# Patient Record
Sex: Male | Born: 1950
Health system: Southern US, Community
[De-identification: ages and names within clinical notes are randomized; demographics above are authoritative.]

## PROBLEM LIST (undated history)

## (undated) DIAGNOSIS — T8859XA Other complications of anesthesia, initial encounter: Secondary | ICD-10-CM

## (undated) DIAGNOSIS — N189 Chronic kidney disease, unspecified: Secondary | ICD-10-CM

## (undated) DIAGNOSIS — M1990 Unspecified osteoarthritis, unspecified site: Secondary | ICD-10-CM

## (undated) DIAGNOSIS — Z87442 Personal history of urinary calculi: Secondary | ICD-10-CM

## (undated) DIAGNOSIS — E785 Hyperlipidemia, unspecified: Secondary | ICD-10-CM

## (undated) DIAGNOSIS — I10 Essential (primary) hypertension: Secondary | ICD-10-CM

## (undated) DIAGNOSIS — T7840XA Allergy, unspecified, initial encounter: Secondary | ICD-10-CM

## (undated) DIAGNOSIS — F329 Major depressive disorder, single episode, unspecified: Secondary | ICD-10-CM

## (undated) DIAGNOSIS — F32A Depression, unspecified: Secondary | ICD-10-CM

## (undated) DIAGNOSIS — K219 Gastro-esophageal reflux disease without esophagitis: Secondary | ICD-10-CM

## (undated) HISTORY — DX: Unspecified osteoarthritis, unspecified site: M19.90

## (undated) HISTORY — DX: Hyperlipidemia, unspecified: E78.5

## (undated) HISTORY — DX: Allergy, unspecified, initial encounter: T78.40XA

## (undated) HISTORY — PX: EYE SURGERY: SHX253

## (undated) HISTORY — DX: Essential (primary) hypertension: I10

## (undated) HISTORY — DX: Chronic kidney disease, unspecified: N18.9

## (undated) HISTORY — DX: Personal history of urinary calculi: Z87.442

## (undated) HISTORY — DX: Major depressive disorder, single episode, unspecified: F32.9

## (undated) HISTORY — DX: Depression, unspecified: F32.A

## (undated) HISTORY — DX: Gastro-esophageal reflux disease without esophagitis: K21.9

## (undated) HISTORY — PX: NOSE SURGERY: SHX723

---

## 2005-04-06 ENCOUNTER — Ambulatory Visit: Payer: Self-pay | Admitting: Otolaryngology

## 2006-04-10 IMAGING — CR DG CHEST 2V
1 series · 2 of 2 positions shown · non-contrast
Comparison: none

REASON FOR EXAM: Dyspnea
COMMENTS:

PROCEDURE:     DXR - DXR CHEST PA (OR AP) AND LATERAL  - [DATE] [DATE]
RESULT:     The lung fields are clear. The heart, mediastinal and osseous
structures show no significant abnormalities.

[Series 1: view not recorded · 0.17mm/px · 2 of 2 slices shown]
[im 1/2]
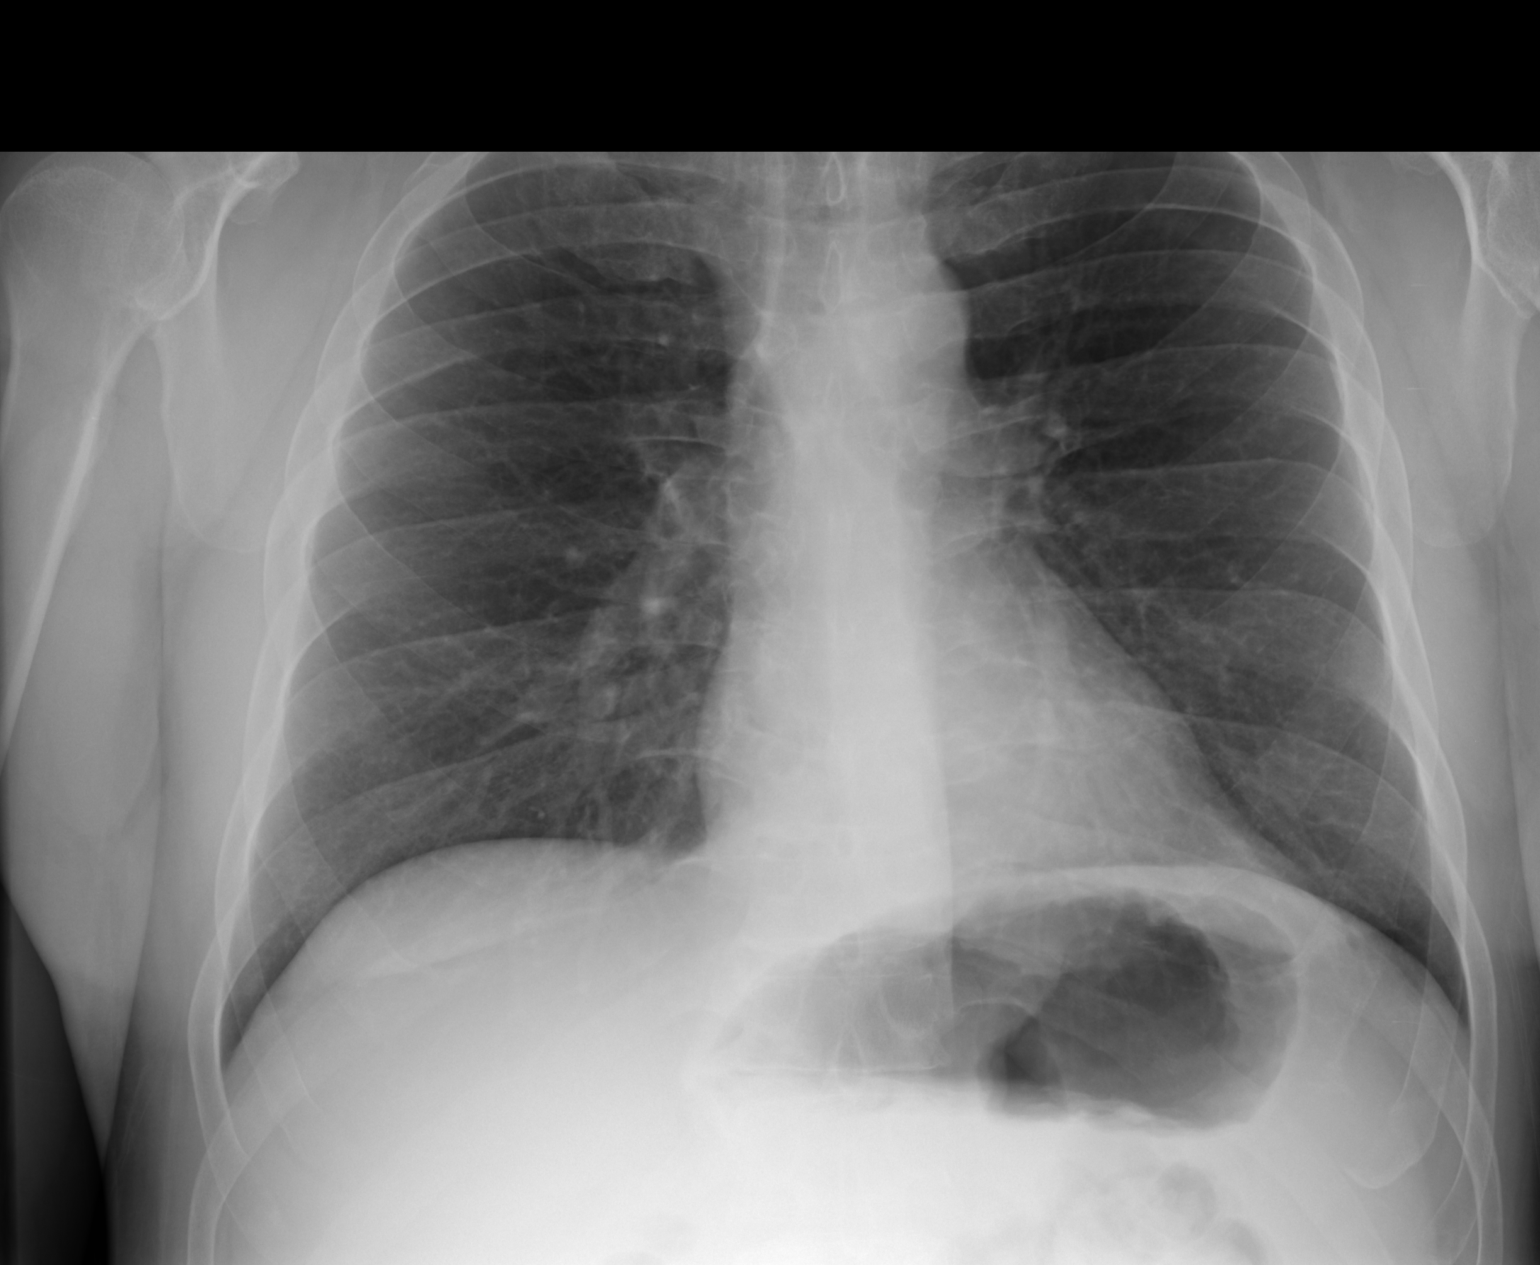
[im 2/2]
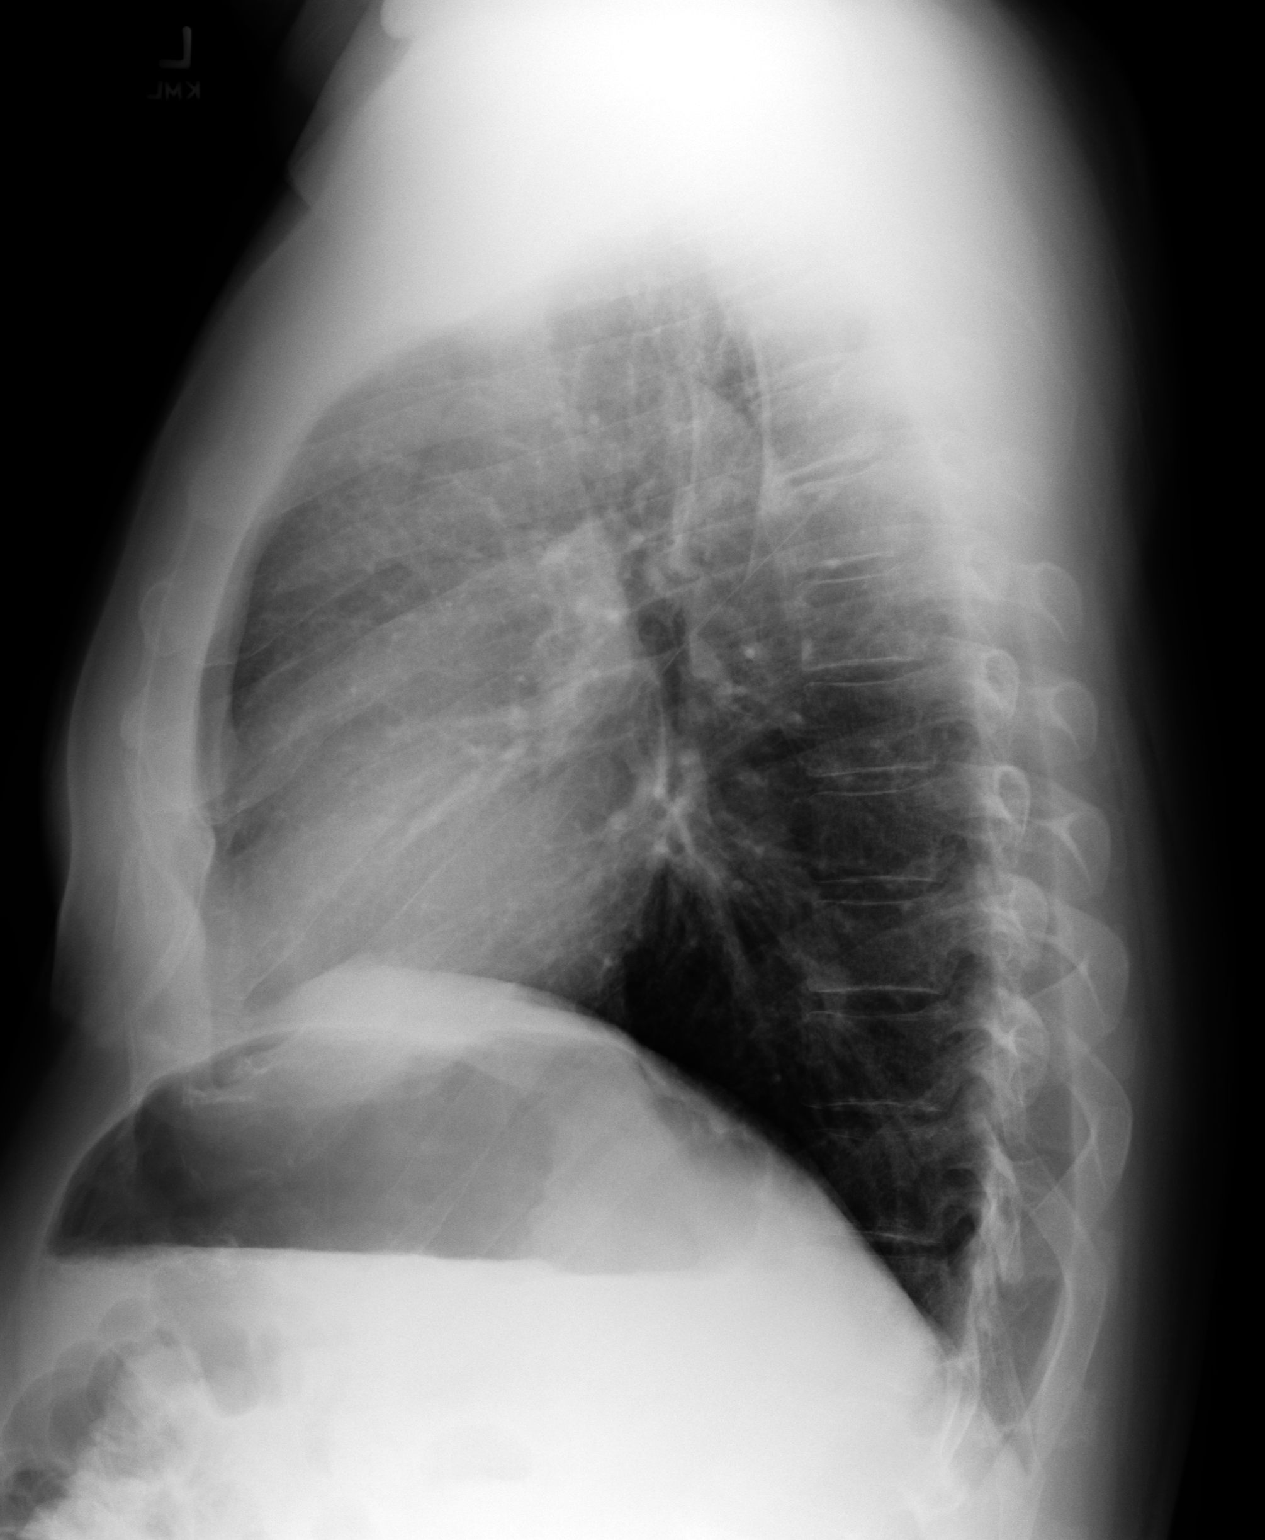

[2 of 2 positions shown; findings below may reference images not displayed]

IMPRESSION: No significant abnormalities are noted.

## 2006-12-09 ENCOUNTER — Emergency Department: Payer: Self-pay | Admitting: Emergency Medicine

## 2006-12-09 ENCOUNTER — Other Ambulatory Visit: Payer: Self-pay

## 2008-05-18 ENCOUNTER — Ambulatory Visit: Payer: Self-pay | Admitting: Gastroenterology

## 2012-05-21 HISTORY — PX: HIP SURGERY: SHX245

## 2013-03-21 HISTORY — PX: HIP SURGERY: SHX245

## 2013-03-21 HISTORY — PX: KNEE SURGERY: SHX244

## 2014-10-28 ENCOUNTER — Other Ambulatory Visit: Payer: Self-pay | Admitting: Family Medicine

## 2014-10-29 NOTE — Telephone Encounter (Signed)
Patient needs PE in Aug with MAC per PP notes

## 2015-03-14 ENCOUNTER — Other Ambulatory Visit: Payer: Self-pay | Admitting: Family Medicine

## 2015-08-03 ENCOUNTER — Ambulatory Visit (INDEPENDENT_AMBULATORY_CARE_PROVIDER_SITE_OTHER): Payer: 59 | Admitting: Family Medicine

## 2015-08-03 ENCOUNTER — Encounter: Payer: Self-pay | Admitting: Family Medicine

## 2015-08-03 VITALS — BP 124/84 | HR 66 | Temp 98.5°F | Ht 68.5 in | Wt 235.0 lb

## 2015-08-03 DIAGNOSIS — Z0000 Encounter for general adult medical examination without abnormal findings: Secondary | ICD-10-CM | POA: Diagnosis not present

## 2015-08-03 DIAGNOSIS — I10 Essential (primary) hypertension: Secondary | ICD-10-CM | POA: Diagnosis not present

## 2015-08-03 LAB — URINALYSIS, ROUTINE W REFLEX MICROSCOPIC
BILIRUBIN UA: NEGATIVE
Glucose, UA: NEGATIVE
Ketones, UA: NEGATIVE
Leukocytes, UA: NEGATIVE
NITRITE UA: NEGATIVE
PH UA: 5.5 (ref 5.0–7.5)
Protein, UA: NEGATIVE
RBC UA: NEGATIVE
SPEC GRAV UA: 1.01 (ref 1.005–1.030)
UUROB: 0.2 mg/dL (ref 0.2–1.0)

## 2015-08-03 MED ORDER — LOSARTAN POTASSIUM-HCTZ 100-12.5 MG PO TABS: 1.0000 | ORAL_TABLET | Freq: Every day | ORAL | Status: DC

## 2015-08-03 NOTE — Progress Notes (Signed)
BP 124/84 mmHg  Pulse 66  Temp(Src) 98.5 F (36.9 C)  Ht 5' 8.5" (1.74 m)  Wt 235 lb (106.595 kg)  BMI 35.21 kg/m2  SpO2 99%   Subjective:    Patient ID: Paul Rodriguez, male    DOB: 09-Mar-1951, 65 y.o.   MRN: [id number]  HPI: Paul Rodriguez is a 65 y.o. male  Chief Complaint  Patient presents with  . Annual Exam    pt states he would be interested in the shingles vaccine   She all in all doing well for scalp cysts taking medication from dermatologist is about through blood pressures been doing well no complaints Relevant past medical, surgical, family and social history reviewed and updated as indicated. Interim medical history since our last visit reviewed. Allergies and medications reviewed and updated.  Review of Systems  Constitutional: Negative.   HENT: Negative.   Eyes: Negative.   Respiratory: Negative.   Cardiovascular: Negative.   Gastrointestinal: Negative.   Endocrine: Negative.   Genitourinary: Negative.   Musculoskeletal: Negative.   Skin: Negative.   Allergic/Immunologic: Negative.   Neurological: Negative.   Hematological: Negative.   Psychiatric/Behavioral: Negative.     Per HPI unless specifically indicated above     Objective:    BP 124/84 mmHg  Pulse 66  Temp(Src) 98.5 F (36.9 C)  Ht 5' 8.5" (1.74 m)  Wt 235 lb (106.595 kg)  BMI 35.21 kg/m2  SpO2 99%  Wt Readings from Last 3 Encounters:  08/03/15 235 lb (106.595 kg)  06/17/14 247 lb (112.038 kg)    Physical Exam  Constitutional: He is oriented to person, place, and time. He appears well-developed and well-nourished.  HENT:  Head: Normocephalic and atraumatic.  Right Ear: External ear normal.  Left Ear: External ear normal.  Eyes: Conjunctivae and EOM are normal. Pupils are equal, round, and reactive to light.  Neck: Normal range of motion. Neck supple.  Cardiovascular: Normal rate, regular rhythm, normal heart sounds and intact distal pulses.   Pulmonary/Chest: Effort  normal and breath sounds normal.  Abdominal: Soft. Bowel sounds are normal. There is no splenomegaly or hepatomegaly.  Genitourinary: Rectum normal, prostate normal and penis normal.  Musculoskeletal: Normal range of motion.  Neurological: He is alert and oriented to person, place, and time. He has normal reflexes.  Skin: No rash noted. No erythema.  Psychiatric: He has a normal mood and affect. His behavior is normal. Judgment and thought content normal.    No results found for this or any previous visit.    Assessment & Plan:   Problem List Items Addressed This Visit      Cardiovascular and Mediastinum   Essential hypertension - Primary   Relevant Medications   EPINEPHrine (EPIPEN 2-PAK) 0.3 mg/0.3 mL IJ SOAJ injection   losartan-hydrochlorothiazide (HYZAAR) 100-12.5 MG tablet   Other Relevant Orders   Comprehensive metabolic panel   Lipid panel   CBC with Differential/Platelet   TSH   PSA   Urinalysis, Routine w reflex microscopic (not at [facility])   Hepatitis C Antibody    Other Visit Diagnoses    PE (physical exam), annual        Relevant Orders    Comprehensive metabolic panel    Lipid panel    CBC with Differential/Platelet    TSH    PSA    Urinalysis, Routine w reflex microscopic (not at [facility])    Hepatitis C Antibody        Follow up plan: Return in  about 6 months (around 02/03/2016) for bmp.

## 2015-08-04 ENCOUNTER — Encounter: Payer: Self-pay | Admitting: Family Medicine

## 2015-08-04 LAB — COMPREHENSIVE METABOLIC PANEL
ALK PHOS: 86 IU/L (ref 39–117)
ALT: 24 IU/L (ref 0–44)
AST: 19 IU/L (ref 0–40)
Albumin/Globulin Ratio: 1.9 (ref 1.2–2.2)
Albumin: 4.5 g/dL (ref 3.6–4.8)
BUN/Creatinine Ratio: 12 (ref 10–22)
BUN: 13 mg/dL (ref 8–27)
Bilirubin Total: 0.7 mg/dL (ref 0.0–1.2)
CHLORIDE: 99 mmol/L (ref 96–106)
CO2: 22 mmol/L (ref 18–29)
Calcium: 9.3 mg/dL (ref 8.6–10.2)
Creatinine, Ser: 1.06 mg/dL (ref 0.76–1.27)
GFR calc Af Amer: 86 mL/min/{1.73_m2} (ref >59)
GFR calc non Af Amer: 74 mL/min/{1.73_m2} (ref >59)
GLUCOSE: 94 mg/dL (ref 65–99)
Globulin, Total: 2.4 g/dL (ref 1.5–4.5)
Potassium: 4.1 mmol/L (ref 3.5–5.2)
Sodium: 137 mmol/L (ref 134–144)
Total Protein: 6.9 g/dL (ref 6.0–8.5)

## 2015-08-04 LAB — LIPID PANEL
Chol/HDL Ratio: 3.9 ratio units (ref 0.0–5.0)
Cholesterol, Total: 150 mg/dL (ref 100–199)
HDL: 38 mg/dL — AB (ref >39)
LDL Calculated: 83 mg/dL (ref 0–99)
TRIGLYCERIDES: 144 mg/dL (ref 0–149)
VLDL CHOLESTEROL CAL: 29 mg/dL (ref 5–40)

## 2015-08-04 LAB — CBC WITH DIFFERENTIAL/PLATELET
BASOS: 1 %
Basophils Absolute: 0 10*3/uL (ref 0.0–0.2)
EOS (ABSOLUTE): 0.1 10*3/uL (ref 0.0–0.4)
EOS: 1 %
HEMATOCRIT: 46.8 % (ref 37.5–51.0)
Hemoglobin: 16 g/dL (ref 12.6–17.7)
IMMATURE GRANULOCYTES: 0 %
Immature Grans (Abs): 0 10*3/uL (ref 0.0–0.1)
LYMPHS ABS: 2.1 10*3/uL (ref 0.7–3.1)
Lymphs: 36 %
MCH: 30.8 pg (ref 26.6–33.0)
MCHC: 34.2 g/dL (ref 31.5–35.7)
MCV: 90 fL (ref 79–97)
MONOS ABS: 0.4 10*3/uL (ref 0.1–0.9)
Monocytes: 7 %
NEUTROS ABS: 3.3 10*3/uL (ref 1.4–7.0)
NEUTROS PCT: 55 %
Platelets: 217 10*3/uL (ref 150–379)
RBC: 5.19 x10E6/uL (ref 4.14–5.80)
RDW: 14 % (ref 12.3–15.4)
WBC: 5.9 10*3/uL (ref 3.4–10.8)

## 2015-08-04 LAB — TSH: TSH: 2.49 u[IU]/mL (ref 0.450–4.500)

## 2015-08-04 LAB — HEPATITIS C ANTIBODY: Hep C Virus Ab: <0.1 s/co ratio (ref 0.0–0.9)

## 2015-08-04 LAB — PSA: Prostate Specific Ag, Serum: 0.8 ng/mL (ref 0.0–4.0)

## 2016-02-21 ENCOUNTER — Ambulatory Visit: Payer: 59 | Admitting: Family Medicine

## 2016-02-23 ENCOUNTER — Ambulatory Visit (INDEPENDENT_AMBULATORY_CARE_PROVIDER_SITE_OTHER): Payer: 59 | Admitting: Family Medicine

## 2016-02-23 ENCOUNTER — Encounter: Payer: Self-pay | Admitting: Family Medicine

## 2016-02-23 VITALS — BP 122/80 | HR 87 | Temp 98.0°F | Ht 69.0 in | Wt 237.6 lb

## 2016-02-23 DIAGNOSIS — Z2911 Encounter for prophylactic immunotherapy for respiratory syncytial virus (RSV): Secondary | ICD-10-CM

## 2016-02-23 DIAGNOSIS — Z23 Encounter for immunization: Secondary | ICD-10-CM | POA: Diagnosis not present

## 2016-02-23 DIAGNOSIS — I10 Essential (primary) hypertension: Secondary | ICD-10-CM | POA: Diagnosis not present

## 2016-02-23 DIAGNOSIS — E785 Hyperlipidemia, unspecified: Secondary | ICD-10-CM | POA: Insufficient documentation

## 2016-02-23 DIAGNOSIS — E7800 Pure hypercholesterolemia, unspecified: Secondary | ICD-10-CM | POA: Diagnosis not present

## 2016-02-23 MED ORDER — MOMETASONE FUROATE 0.1 % EX CREA: 1.0000 "application " | TOPICAL_CREAM | Freq: Every day | CUTANEOUS | 0 refills | Status: DC

## 2016-02-23 MED ORDER — ROSUVASTATIN CALCIUM 5 MG PO TABS: 5.0000 mg | ORAL_TABLET | Freq: Every day | ORAL | 2 refills | Status: DC

## 2016-02-23 NOTE — Assessment & Plan Note (Signed)
The current medical regimen is effective;  continue present plan and medications.  

## 2016-02-23 NOTE — Patient Instructions (Addendum)
Shingles Vaccine: What You Need to Know WHAT IS SHINGLES?  Shingles is a painful skin rash, often with blisters. It is also called Herpes Zoster or just Zoster.  A shingles rash usually appears on one side of the face or body and lasts from 2 to 4 weeks. Its main symptom is pain, which can be quite severe. Other symptoms of shingles can include fever, headache, chills, and upset stomach. Very rarely, a shingles infection can lead to pneumonia, hearing problems, blindness, brain inflammation (encephalitis), or death.  For about 1 person in 5, severe pain can continue even after the rash clears up. This is called post-herpetic neuralgia.  Shingles is caused by the Varicella Zoster virus. This is the same virus that causes chickenpox. Only someone who has had a case of chickenpox or rarely, has gotten chickenpox vaccine, can get shingles. The virus stays in your body. It can reappear many years later to cause a case of shingles.  You cannot catch shingles from another person with shingles. However, a person who has never had chickenpox (or chickenpox vaccine) could get chickenpox from someone with shingles. This is not very common.  Shingles is far more common in people 15 and older than in younger people. It is also more common in people whose immune systems are weakened because of a disease such as cancer or drugs such as steroids or chemotherapy.  At least 1 million people get shingles per year in the [place]. SHINGLES VACCINE  A vaccine for shingles was licensed in 8657. In clinical trials, the vaccine reduced the risk of shingles by 50%. It can also reduce the pain in people who still get shingles after being vaccinated.  A single dose of shingles vaccine is recommended for adults 56 years of age and older. SOME PEOPLE SHOULD NOT GET SHINGLES VACCINE OR SHOULD WAIT A person should not get shingles vaccine if he or she:  Has ever had a life-threatening allergic reaction to gelatin, the  antibiotic neomycin, or any other component of shingles vaccine. Tell your caregiver if you have any severe allergies.  Has a weakened immune system because of current:  AIDS or another disease that affects the immune system.  Treatment with drugs that affect the immune system, such as prolonged use of high-dose steroids.  Cancer treatment, such as radiation or chemotherapy.  Cancer affecting the bone marrow or lymphatic system, such as leukemia or lymphoma.  Is pregnant, or might be pregnant. Women should not become pregnant until at least 4 weeks after getting shingles vaccine. Someone with a minor illness, such as a cold, may be vaccinated. Anyone with a moderate or severe acute illness should usually wait until he or she recovers before getting the vaccine. This includes anyone with a temperature of 101.3 F (38 C) or higher. WHAT ARE THE RISKS FROM SHINGLES VACCINE?  A vaccine, like any medicine, could possibly cause serious problems, such as severe allergic reactions. However, the risk of a vaccine causing serious harm, or death, is extremely small.  No serious problems have been identified with shingles vaccine. Mild Problems  Redness, soreness, swelling, or itching at the site of the injection (about 1 person in 3).  Headache (about 1 person in 5). Like all vaccines, shingles vaccine is being closely monitored for unusual or severe problems. WHAT IF THERE IS A MODERATE OR SEVERE REACTION? What should I look for? Any unusual condition, such as a severe allergic reaction or a high fever. If a severe allergic reaction  occurred, it would be within a few minutes to an hour after the shot. Signs of a serious allergic reaction can include difficulty breathing, weakness, hoarseness or wheezing, a fast heartbeat, hives, dizziness, paleness, or swelling of the throat. What should I do?  Call your caregiver, or get the person to a caregiver right away.  Tell the caregiver what  happened, the date and time it happened, and when the vaccination was given.  Ask the caregiver to report the reaction by filing a Vaccine Adverse Event Reporting System (VAERS) form. Or, you can file this report through the VAERS web site at www.vaers.SamedayNews.es or by calling 217-179-2310. VAERS does not provide medical advice. HOW CAN I LEARN MORE?  Ask your caregiver. He or she can give you the vaccine package insert or suggest other sources of information.  Contact the Centers for Disease Control and Prevention (CDC):  Call 4078075968 (1-800-CDC-INFO).  Visit the CDC website at http://hunter.com/ CDC Shingles Vaccine VIS (02/24/08)   This information is not intended to replace advice given to you by your health care provider. Make sure you discuss any questions you have with your health care provider.   Document Released: 03/04/2006 Document Revised: 09/21/2014 Document Reviewed: 08/27/2012 Elsevier Interactive Patient Education 2016 Elsevier Inc. Pneumococcal Polysaccharide Vaccine: What You Need to Know 1. Why get vaccinated? Vaccination can protect older adults (and some children and younger adults) from pneumococcal disease. Pneumococcal disease is caused by bacteria that can spread from person to person through close contact. It can cause ear infections, and it can also lead to more serious infections of the:   Lungs (pneumonia),  Blood (bacteremia), and  Covering of the brain and spinal cord (meningitis). Meningitis can cause deafness and brain damage, and it can be fatal. Anyone can get pneumococcal disease, but children under 69 years of age, people with certain medical conditions, adults over 76 years of age, and cigarette smokers are at the highest risk. About 18,000 older adults die each year from pneumococcal disease in the [place]. Treatment of pneumococcal infections with penicillin and other drugs used to be more effective. But some strains of the disease  have become resistant to these drugs. This makes prevention of the disease, through vaccination, even more important. 2. Pneumococcal polysaccharide vaccine (PPSV23) Pneumococcal polysaccharide vaccine (PPSV23) protects against 23 types of pneumococcal bacteria. It will not prevent all pneumococcal disease. PPSV23 is recommended for:  All adults 70 years of age and older,  Anyone 2 through 64 years of age with certain long-term health problems,  Anyone 2 through 64 years of age with a weakened immune system,  Adults 33 through 64 years of age who smoke cigarettes or have asthma. Most people need only one dose of PPSV. A second dose is recommended for certain high-risk groups. People 85 and older should get a dose even if they have gotten one or more doses of the vaccine before they turned 65. Your healthcare provider can give you more information about these recommendations. Most healthy adults develop protection within 2 to 3 weeks of getting the shot. 3. Some people should not get this vaccine  Anyone who has had a life-threatening allergic reaction to PPSV should not get another dose.  Anyone who has a severe allergy to any component of PPSV should not receive it. Tell your provider if you have any severe allergies.  Anyone who is moderately or severely ill when the shot is scheduled may be asked to wait until they recover before getting  the vaccine. Someone with a mild illness can usually be vaccinated.  Children less than 13 years of age should not receive this vaccine.  There is no evidence that PPSV is harmful to either a pregnant woman or to her fetus. However, as a precaution, women who need the vaccine should be vaccinated before becoming pregnant, if possible. 4. Risks of a vaccine reaction With any medicine, including vaccines, there is a chance of side effects. These are usually mild and go away on their own, but serious reactions are also possible. About half of people who  get PPSV have mild side effects, such as redness or pain where the shot is given, which go away within about two days. Less than 1 out of 100 people develop a fever, muscle aches, or more severe local reactions. Problems that could happen after any vaccine:  People sometimes faint after a medical procedure, including vaccination. Sitting or lying down for about 15 minutes can help prevent fainting, and injuries caused by a fall. Tell your doctor if you feel dizzy, or have vision changes or ringing in the ears.  Some people get severe pain in the shoulder and have difficulty moving the arm where a shot was given. This happens very rarely.  Any medication can cause a severe allergic reaction. Such reactions from a vaccine are very rare, estimated at about 1 in a million doses, and would happen within a few minutes to a few hours after the vaccination. As with any medicine, there is a very remote chance of a vaccine causing a serious injury or death. The safety of vaccines is always being monitored. For more information, visit: http://www.aguilar.org/ 5. What if there is a serious reaction? What should I look for? Look for anything that concerns you, such as signs of a severe allergic reaction, very high fever, or unusual behavior.  Signs of a severe allergic reaction can include hives, swelling of the face and throat, difficulty breathing, a fast heartbeat, dizziness, and weakness. These would usually start a few minutes to a few hours after the vaccination. What should I do? If you think it is a severe allergic reaction or other emergency that can't wait, call 9-1-1 or get to the nearest hospital. Otherwise, call your doctor. Afterward, the reaction should be reported to the Vaccine Adverse Event Reporting System (VAERS). Your doctor might file this report, or you can do it yourself through the VAERS web site at www.vaers.SamedayNews.es, or by calling [phone].  VAERS does not give medical  advice. 6. How can I learn more?  Ask your doctor. He or she can give you the vaccine package insert or suggest other sources of information.  Call your local or state health department.  Contact the Centers for Disease Control and Prevention (CDC):  Call 4630188051 (1-800-CDC-INFO) or  Visit CDC's website at http://hunter.com/ CDC Pneumococcal Polysaccharide Vaccine VIS (09/11/13)   This information is not intended to replace advice given to you by your health care provider. Make sure you discuss any questions you have with your health care provider.   Document Released: 03/04/2006 Document Revised: 05/28/2014 Document Reviewed: 09/14/2013 Elsevier Interactive Patient Education [employer].

## 2016-02-23 NOTE — Assessment & Plan Note (Signed)
Discussed cholesterol risk factors and medication will change to Crestor 5 mg and will recheck in 1 month or so to recheck lipids and liver.

## 2016-02-23 NOTE — Progress Notes (Signed)
BP 122/80 (BP Location: Left Arm, Patient Position: Sitting, Cuff Size: Normal)   Pulse 87   Temp 98 F (36.7 C)   Ht 5\' 9"  (1.753 m)   Wt 237 lb 9.6 oz (107.8 kg)   SpO2 95%   BMI 35.09 kg/m    Subjective:    Patient ID: Paul Rodriguez, male    DOB: 05/02/1951, 65 y.o.   MRN: [id number]  HPI: Paul Rodriguez is a 65 y.o. male  Chief Complaint  Patient presents with  . Follow-up  . Hypertension   Patient recheck hypertension good blood pressure control no issues with medications. Reviewed notes from cardiology patient was placed on Pravachol 20 mg for cholesterol due to elevated risk factors on cardiac risk factor scoring. Patient developed marked myalgias especially in his legs and stop the medications. Myalgias have since resolved.  Relevant past medical, surgical, family and social history reviewed and updated as indicated. Interim medical history since our last visit reviewed. Allergies and medications reviewed and updated.  Review of Systems  Constitutional: Negative.   Respiratory: Negative.   Cardiovascular: Negative.     Per HPI unless specifically indicated above     Objective:    BP 122/80 (BP Location: Left Arm, Patient Position: Sitting, Cuff Size: Normal)   Pulse 87   Temp 98 F (36.7 C)   Ht 5\' 9"  (1.753 m)   Wt 237 lb 9.6 oz (107.8 kg)   SpO2 95%   BMI 35.09 kg/m   Wt Readings from Last 3 Encounters:  02/23/16 237 lb 9.6 oz (107.8 kg)  08/03/15 235 lb (106.6 kg)  06/17/14 247 lb (112 kg)    Physical Exam  Constitutional: He is oriented to person, place, and time. He appears well-developed and well-nourished. No distress.  HENT:  Head: Normocephalic and atraumatic.  Right Ear: Hearing normal.  Left Ear: Hearing normal.  Nose: Nose normal.  Eyes: Conjunctivae and lids are normal. Right eye exhibits no discharge. Left eye exhibits no discharge. No scleral icterus.  Cardiovascular: Normal rate, regular rhythm and normal heart sounds.     Pulmonary/Chest: Effort normal and breath sounds normal. No respiratory distress.  Musculoskeletal: Normal range of motion.  Neurological: He is alert and oriented to person, place, and time.  Skin: Skin is intact. No rash noted.  Psychiatric: He has a normal mood and affect. His speech is normal and behavior is normal. Judgment and thought content normal. Cognition and memory are normal.    Results for orders placed or performed in visit on 08/03/15  Comprehensive metabolic panel  Result Value Ref Range   Glucose 94 65 - 99 mg/dL   BUN 13 8 - 27 mg/dL   Creatinine, Ser 1.06 0.76 - 1.27 mg/dL   GFR calc non Af Amer 74 >59 mL/min/1.73   GFR calc Af Amer 86 >59 mL/min/1.73   BUN/Creatinine Ratio 12 10 - 22   Sodium 137 134 - 144 mmol/L   Potassium 4.1 3.5 - 5.2 mmol/L   Chloride 99 96 - 106 mmol/L   CO2 22 18 - 29 mmol/L   Calcium 9.3 8.6 - 10.2 mg/dL   Total Protein 6.9 6.0 - 8.5 g/dL   Albumin 4.5 3.6 - 4.8 g/dL   Globulin, Total 2.4 1.5 - 4.5 g/dL   Albumin/Globulin Ratio 1.9 1.2 - 2.2   Bilirubin Total 0.7 0.0 - 1.2 mg/dL   Alkaline Phosphatase 86 39 - 117 IU/L   AST 19 0 - 40 IU/L  ALT 24 0 - 44 IU/L  Lipid panel  Result Value Ref Range   Cholesterol, Total 150 100 - 199 mg/dL   Triglycerides 144 0 - 149 mg/dL   HDL 38 (L) >39 mg/dL   VLDL Cholesterol Cal 29 5 - 40 mg/dL   LDL Calculated 83 0 - 99 mg/dL   Chol/HDL Ratio 3.9 0.0 - 5.0 ratio units  CBC with Differential/Platelet  Result Value Ref Range   WBC 5.9 3.4 - 10.8 x10E3/uL   RBC 5.19 4.14 - 5.80 x10E6/uL   Hemoglobin 16.0 12.6 - 17.7 g/dL   Hematocrit 46.8 37.5 - 51.0 %   MCV 90 79 - 97 fL   MCH 30.8 26.6 - 33.0 pg   MCHC 34.2 31.5 - 35.7 g/dL   RDW 14.0 12.3 - 15.4 %   Platelets 217 150 - 379 x10E3/uL   Neutrophils 55 %   Lymphs 36 %   Monocytes 7 %   Eos 1 %   Basos 1 %   Neutrophils Absolute 3.3 1.4 - 7.0 x10E3/uL   Lymphocytes Absolute 2.1 0.7 - 3.1 x10E3/uL   Monocytes Absolute 0.4 0.1 - 0.9  x10E3/uL   EOS (ABSOLUTE) 0.1 0.0 - 0.4 x10E3/uL   Basophils Absolute 0.0 0.0 - 0.2 x10E3/uL   Immature Granulocytes 0 %   Immature Grans (Abs) 0.0 0.0 - 0.1 x10E3/uL  TSH  Result Value Ref Range   TSH 2.490 0.450 - 4.500 uIU/mL  PSA  Result Value Ref Range   Prostate Specific Ag, Serum 0.8 0.0 - 4.0 ng/mL  Urinalysis, Routine w reflex microscopic (not at [facility])  Result Value Ref Range   Specific Gravity, UA 1.010 1.005 - 1.030   pH, UA 5.5 5.0 - 7.5   Color, UA Yellow Yellow   Appearance Ur Clear Clear   Leukocytes, UA Negative Negative   Protein, UA Negative Negative/Trace   Glucose, UA Negative Negative   Ketones, UA Negative Negative   RBC, UA Negative Negative   Bilirubin, UA Negative Negative   Urobilinogen, Ur 0.2 0.2 - 1.0 mg/dL   Nitrite, UA Negative Negative  Hepatitis C Antibody  Result Value Ref Range   Hep C Virus Ab <0.1 0.0 - 0.9 s/co ratio      Assessment & Plan:   Problem List Items Addressed This Visit      Cardiovascular and Mediastinum   Essential hypertension - Primary    The current medical regimen is effective;  continue present plan and medications.       Relevant Medications   rosuvastatin (CRESTOR) 5 MG tablet   Other Relevant Orders   Basic metabolic panel     Other   Hyperlipidemia    Discussed cholesterol risk factors and medication will change to Crestor 5 mg and will recheck in 1 month or so to recheck lipids and liver.      Relevant Medications   rosuvastatin (CRESTOR) 5 MG tablet    Other Visit Diagnoses   None.      Follow up plan: Return in about 4 weeks (around 03/22/2016) for   Lipids, ALT, AST.

## 2016-02-23 NOTE — Addendum Note (Signed)
Addended by: Sandria Manly on: 02/23/2016 03:26 PM   Modules accepted: Orders

## 2016-02-24 LAB — BASIC METABOLIC PANEL
BUN/Creatinine Ratio: 12 (ref 10–24)
BUN: 15 mg/dL (ref 8–27)
CALCIUM: 9.5 mg/dL (ref 8.6–10.2)
CO2: 26 mmol/L (ref 18–29)
CREATININE: 1.23 mg/dL (ref 0.76–1.27)
Chloride: 101 mmol/L (ref 96–106)
GFR calc Af Amer: 71 mL/min/{1.73_m2} (ref >59)
GFR, EST NON AFRICAN AMERICAN: 62 mL/min/{1.73_m2} (ref >59)
GLUCOSE: 103 mg/dL — AB (ref 65–99)
Potassium: 4.3 mmol/L (ref 3.5–5.2)
Sodium: 141 mmol/L (ref 134–144)

## 2016-02-27 ENCOUNTER — Encounter: Payer: Self-pay | Admitting: Family Medicine

## 2016-03-29 ENCOUNTER — Ambulatory Visit: Payer: 59 | Admitting: Family Medicine

## 2016-04-04 ENCOUNTER — Encounter: Payer: Self-pay | Admitting: Family Medicine

## 2016-04-04 ENCOUNTER — Ambulatory Visit (INDEPENDENT_AMBULATORY_CARE_PROVIDER_SITE_OTHER): Payer: 59 | Admitting: Family Medicine

## 2016-04-04 VITALS — BP 125/84 | HR 86 | Temp 98.1°F | Wt 240.7 lb

## 2016-04-04 DIAGNOSIS — E7800 Pure hypercholesterolemia, unspecified: Secondary | ICD-10-CM | POA: Diagnosis not present

## 2016-04-04 DIAGNOSIS — J019 Acute sinusitis, unspecified: Secondary | ICD-10-CM

## 2016-04-04 LAB — LP+ALT+AST PICCOLO, WAIVED
ALT (SGPT) PICCOLO, WAIVED: 33 U/L (ref 10–47)
AST (SGOT) PICCOLO, WAIVED: 33 U/L (ref 11–38)
CHOL/HDL RATIO PICCOLO,WAIVE: 2.6 mg/dL
Cholesterol Piccolo, Waived: 120 mg/dL (ref <200)
HDL Chol Piccolo, Waived: 46 mg/dL — ABNORMAL LOW (ref >59)
LDL Chol Calc Piccolo Waived: 38 mg/dL (ref <100)
Triglycerides Piccolo,Waived: 183 mg/dL — ABNORMAL HIGH (ref <150)
VLDL CHOL CALC PICCOLO,WAIVE: 37 mg/dL — AB (ref <30)

## 2016-04-04 MED ORDER — AMOXICILLIN 875 MG PO TABS: 875.0000 mg | ORAL_TABLET | Freq: Two times a day (BID) | ORAL | 0 refills | Status: DC

## 2016-04-04 NOTE — Progress Notes (Signed)
BP 125/84 (BP Location: Left Arm, Patient Position: Sitting, Cuff Size: Large)   Pulse 86   Temp 98.1 F (36.7 C)   Wt 240 lb 11.2 oz (109.2 kg)   SpO2 97%   BMI 35.55 kg/m    Subjective:    Patient ID: Paul Rodriguez, male    DOB: 18-Dec-1950, 65 y.o.   MRN: [id number]  HPI: Paul Rodriguez is a 65 y.o. male  Chief Complaint  Patient presents with  . Hyperlipidemia   Patient doing well on Crestor 5 mg maybe some morning stiffness but not enough to attribute medications otherwise doing well.  Patient with 2 and half weeks plus of sinus drainage pressure congestion cough productive green sputum and really just not getting better and may be worse does have pressure sensation in his head with hoarseness. Has tried some over-the-counter medications with no relief  Relevant past medical, surgical, family and social history reviewed and updated as indicated. Interim medical history since our last visit reviewed. Allergies and medications reviewed and updated.  Review of Systems  Constitutional: Negative.   HENT: Positive for congestion, postnasal drip, sinus pain, sinus pressure and sore throat.   Respiratory: Positive for cough. Negative for shortness of breath.   Cardiovascular: Negative.     Per HPI unless specifically indicated above     Objective:    BP 125/84 (BP Location: Left Arm, Patient Position: Sitting, Cuff Size: Large)   Pulse 86   Temp 98.1 F (36.7 C)   Wt 240 lb 11.2 oz (109.2 kg)   SpO2 97%   BMI 35.55 kg/m   Wt Readings from Last 3 Encounters:  04/04/16 240 lb 11.2 oz (109.2 kg)  02/23/16 237 lb 9.6 oz (107.8 kg)  08/03/15 235 lb (106.6 kg)    Physical Exam  Constitutional: He is oriented to person, place, and time. He appears well-developed and well-nourished. No distress.  HENT:  Head: Normocephalic and atraumatic.  Right Ear: Hearing normal.  Left Ear: Hearing normal.  Nose: Nose normal.  Eyes: Conjunctivae and lids are normal. Right eye  exhibits no discharge. Left eye exhibits no discharge. No scleral icterus.  Cardiovascular: Normal rate, regular rhythm and normal heart sounds.   Pulmonary/Chest: Effort normal and breath sounds normal. No respiratory distress.  Musculoskeletal: Normal range of motion.  Neurological: He is alert and oriented to person, place, and time.  Skin: Skin is intact. No rash noted.  Psychiatric: He has a normal mood and affect. His speech is normal and behavior is normal. Judgment and thought content normal. Cognition and memory are normal.    Results for orders placed or performed in visit on [id number]  Basic metabolic panel  Result Value Ref Range   Glucose 103 (H) 65 - 99 mg/dL   BUN 15 8 - 27 mg/dL   Creatinine, Ser 1.23 0.76 - 1.27 mg/dL   GFR calc non Af Amer 62 >59 mL/min/1.73   GFR calc Af Amer 71 >59 mL/min/1.73   BUN/Creatinine Ratio 12 10 - 24   Sodium 141 134 - 144 mmol/L   Potassium 4.3 3.5 - 5.2 mmol/L   Chloride 101 96 - 106 mmol/L   CO2 26 18 - 29 mmol/L   Calcium 9.5 8.6 - 10.2 mg/dL      Assessment & Plan:   Problem List Items Addressed This Visit      Other   Hyperlipidemia - Primary   Relevant Orders   LP+ALT+AST [place], [place]    Other  Visit Diagnoses    Acute sinusitis, recurrence not specified, unspecified location       Patient discussed sinusitis care and treatment with Tylenol Mucinex nasal rinse Allegra-D which patient already uses may try Nasacort or Flonase.   Relevant Medications   amoxicillin (AMOXIL) 875 MG tablet       Follow up plan: Return for Physical Exam.

## 2016-04-24 ENCOUNTER — Other Ambulatory Visit
Admission: RE | Admit: 2016-04-24 | Discharge: 2016-04-24 | Disposition: A | Discharge status: Home / Self Care | Payer: 59 | Source: Other Acute Inpatient Hospital | Attending: Family Medicine | Admitting: Family Medicine

## 2016-04-24 ENCOUNTER — Other Ambulatory Visit: Payer: Self-pay | Admitting: Unknown Physician Specialty

## 2016-04-24 ENCOUNTER — Ambulatory Visit
Admission: RE | Admit: 2016-04-24 | Discharge: 2016-04-24 | Disposition: A | Discharge status: Home / Self Care | Payer: 59 | Source: Ambulatory Visit | Attending: Unknown Physician Specialty | Admitting: Unknown Physician Specialty

## 2016-04-24 DIAGNOSIS — R05 Cough: Secondary | ICD-10-CM | POA: Insufficient documentation

## 2016-04-24 DIAGNOSIS — R918 Other nonspecific abnormal finding of lung field: Secondary | ICD-10-CM | POA: Insufficient documentation

## 2016-04-24 DIAGNOSIS — R059 Cough, unspecified: Secondary | ICD-10-CM

## 2016-04-24 LAB — EXPECTORATED SPUTUM ASSESSMENT W GRAM STAIN, RFLX TO RESP C

## 2016-04-24 LAB — EXPECTORATED SPUTUM ASSESSMENT W REFEX TO RESP CULTURE

## 2016-04-24 IMAGING — CR DG CHEST 2V
1 series · 2 of 2 positions shown · non-contrast
Comparison: [DATE].

CLINICAL DATA: Cough.

EXAM:
CHEST  2 VIEW

[Series 1: dg chest 2 view · 0.14mm/px · 2 of 2 slices shown]
[im 1/2]
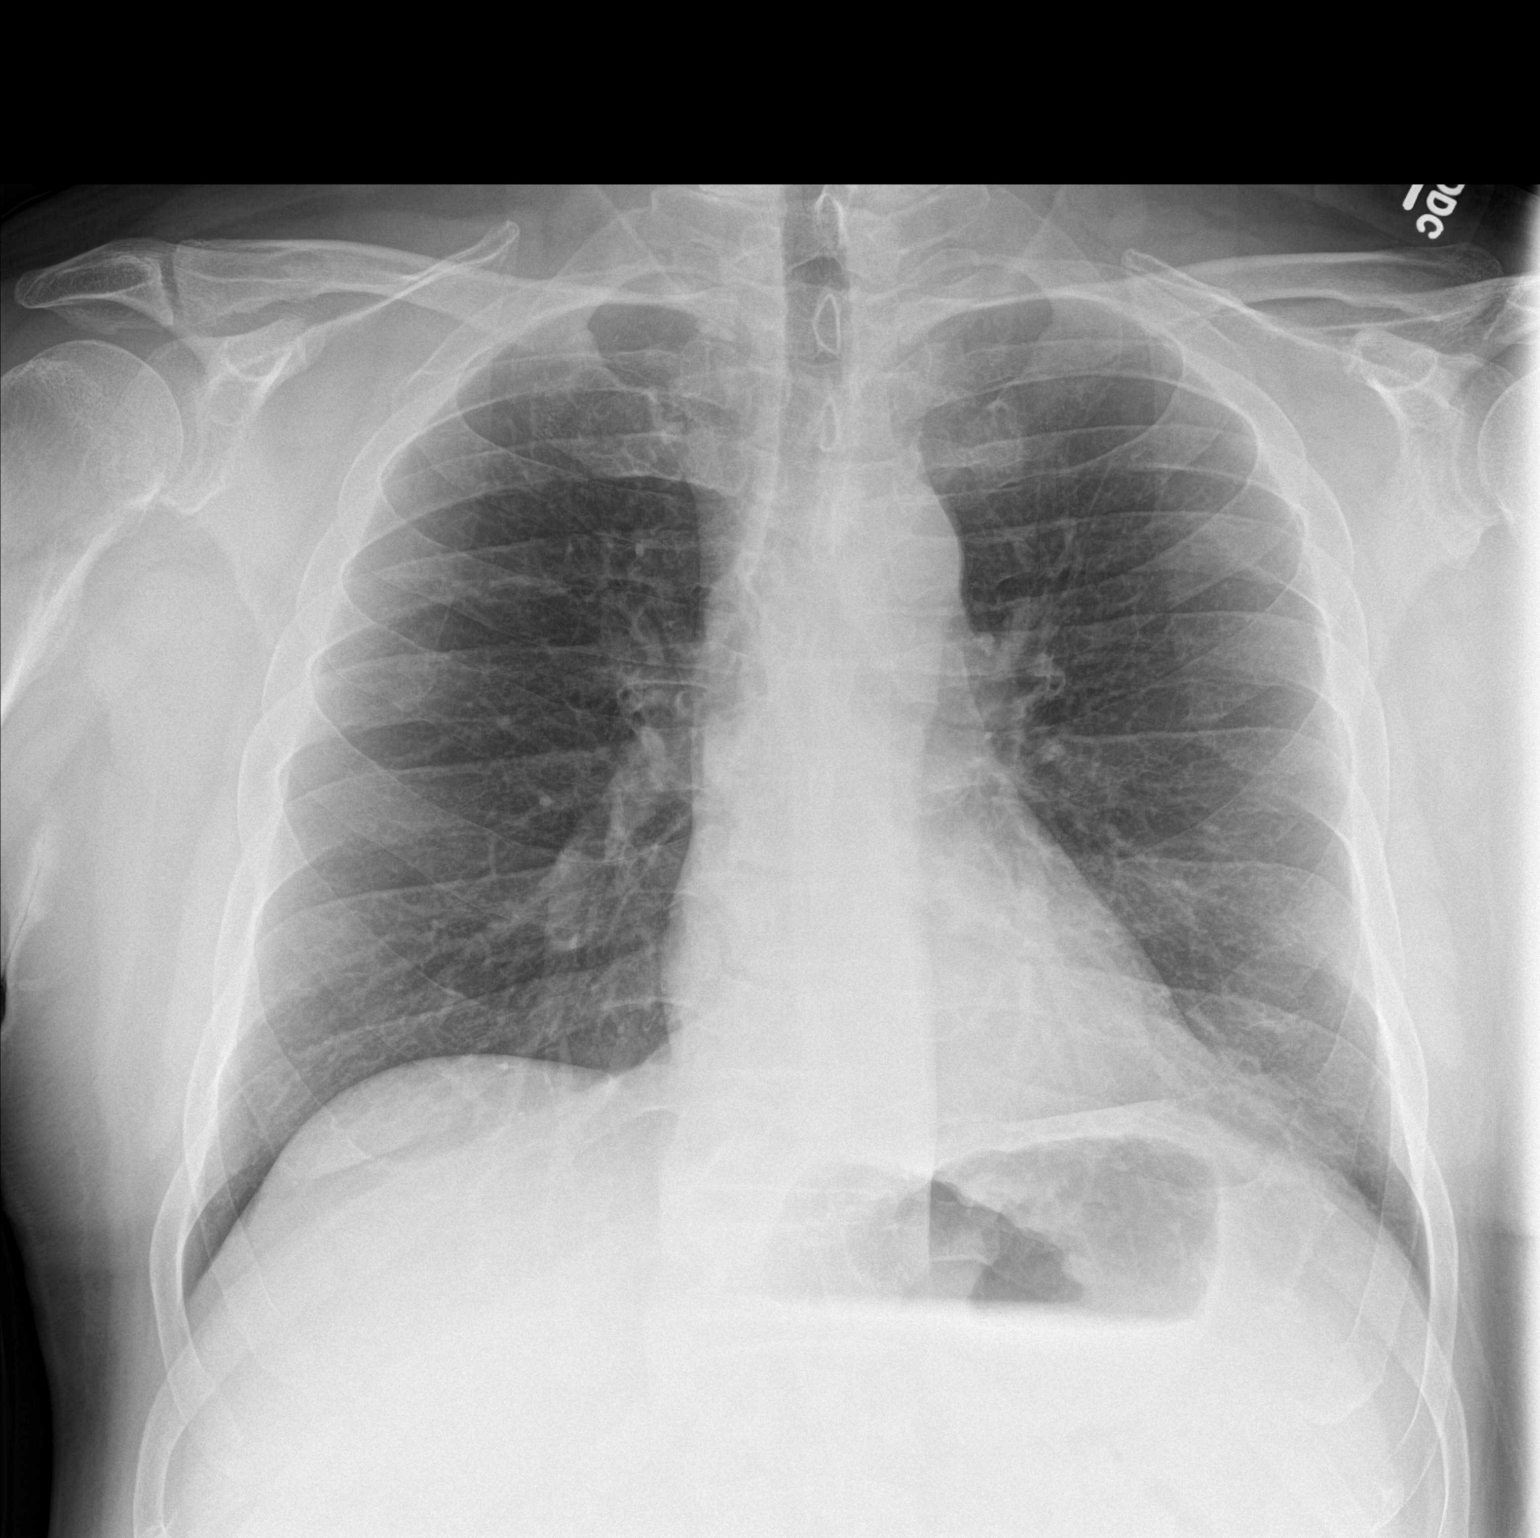
[im 2/2]
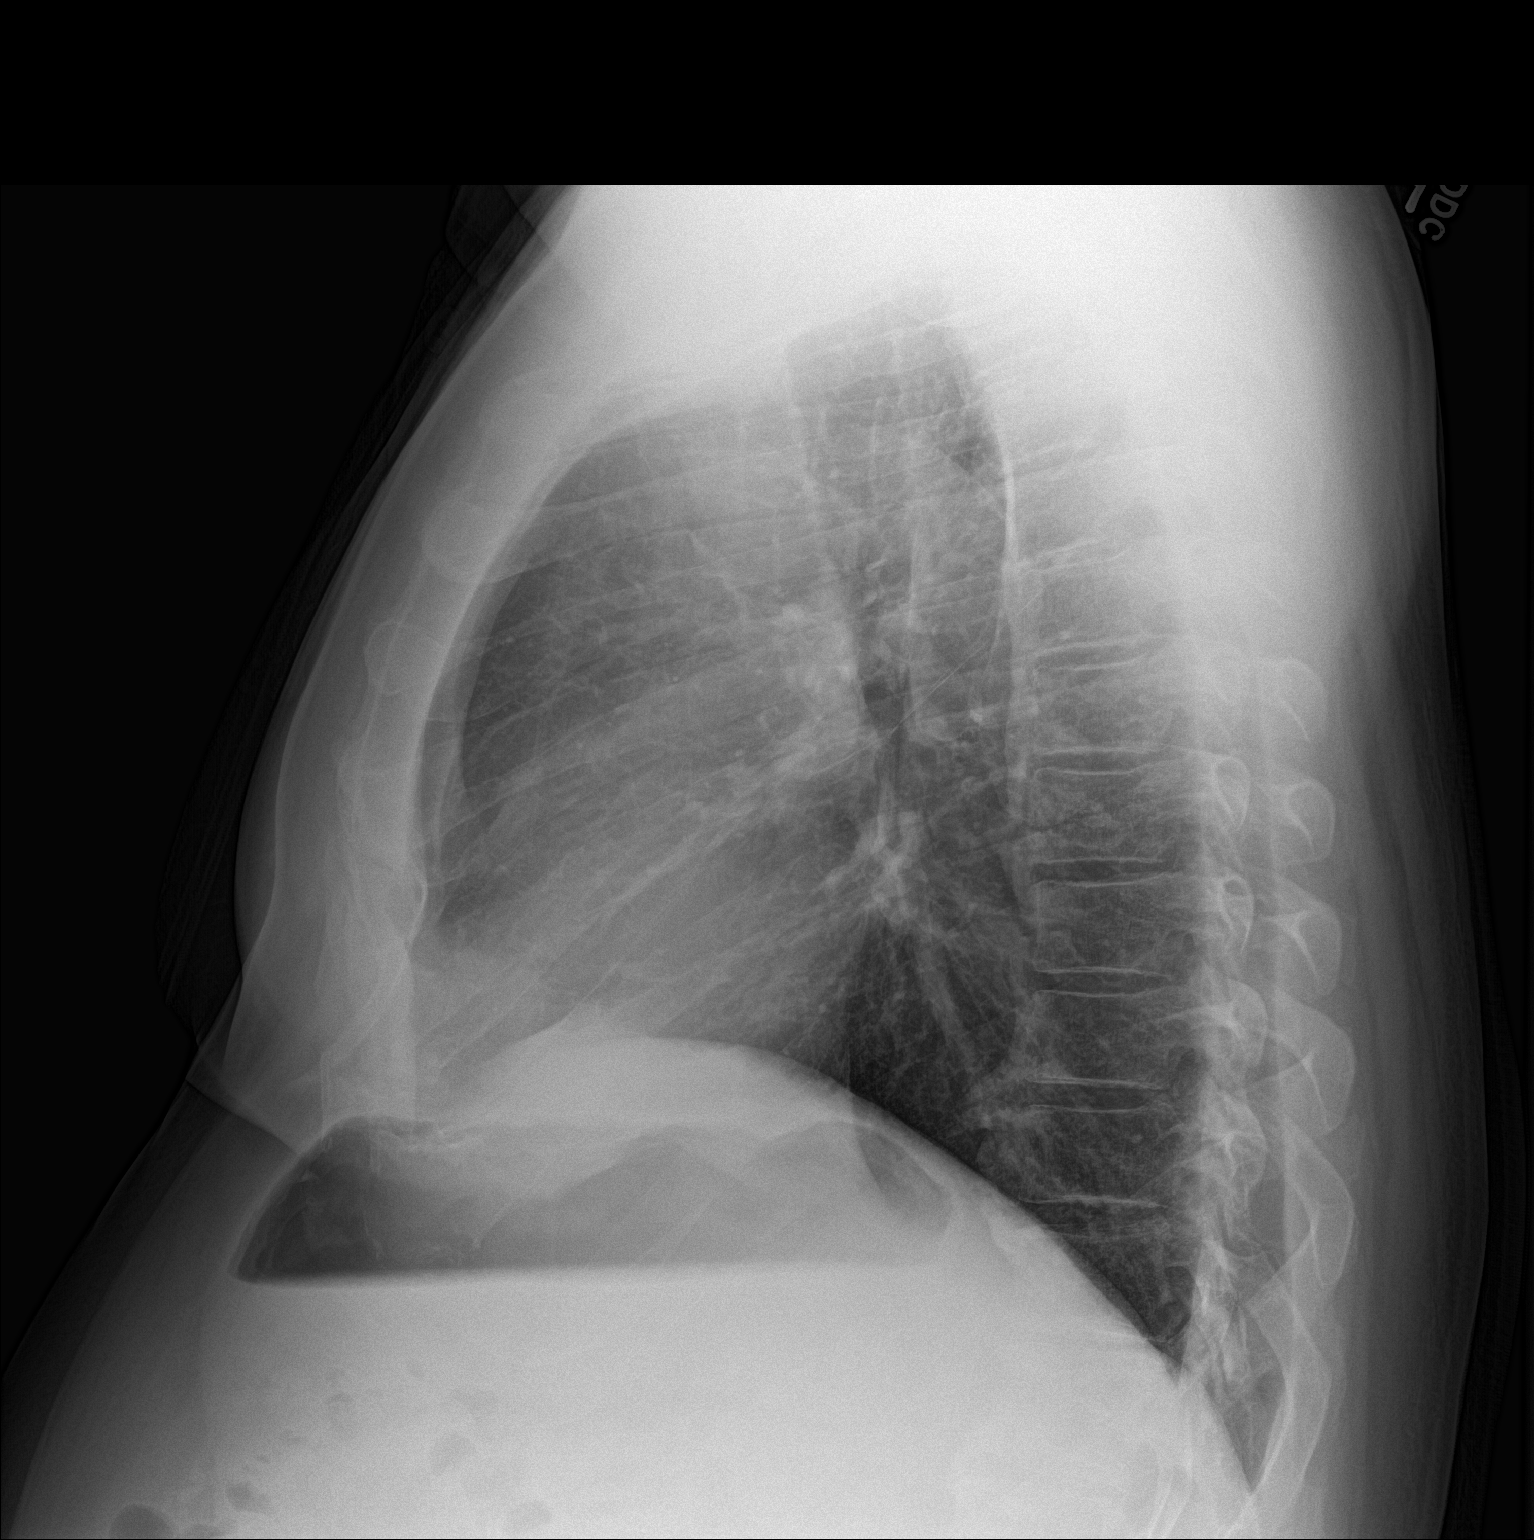

[2 of 2 positions shown; findings below may reference images not displayed]

FINDINGS: Mediastinum and hilar structures are normal. Minimal lingular
infiltrate cannot be excluded. No pleural effusion or pneumothorax.
Heart size normal.
IMPRESSION: 1. Minimal lingular infiltrate cannot be excluded.

2. Exam is otherwise negative.

## 2016-04-27 LAB — CULTURE, RESPIRATORY W GRAM STAIN

## 2016-04-27 LAB — CULTURE, RESPIRATORY: CULTURE: NORMAL

## 2016-08-13 ENCOUNTER — Encounter: Payer: Self-pay | Admitting: Family Medicine

## 2016-08-13 ENCOUNTER — Ambulatory Visit (INDEPENDENT_AMBULATORY_CARE_PROVIDER_SITE_OTHER): Payer: 59 | Admitting: Family Medicine

## 2016-08-13 VITALS — BP 115/77 | HR 59 | Resp 12 | Ht 69.88 in | Wt 229.6 lb

## 2016-08-13 DIAGNOSIS — Z0000 Encounter for general adult medical examination without abnormal findings: Secondary | ICD-10-CM

## 2016-08-13 DIAGNOSIS — E7800 Pure hypercholesterolemia, unspecified: Secondary | ICD-10-CM | POA: Diagnosis not present

## 2016-08-13 DIAGNOSIS — Z114 Encounter for screening for human immunodeficiency virus [HIV]: Secondary | ICD-10-CM | POA: Diagnosis not present

## 2016-08-13 DIAGNOSIS — N400 Enlarged prostate without lower urinary tract symptoms: Secondary | ICD-10-CM | POA: Diagnosis not present

## 2016-08-13 DIAGNOSIS — Z1329 Encounter for screening for other suspected endocrine disorder: Secondary | ICD-10-CM | POA: Diagnosis not present

## 2016-08-13 DIAGNOSIS — I10 Essential (primary) hypertension: Secondary | ICD-10-CM | POA: Diagnosis not present

## 2016-08-13 DIAGNOSIS — Z125 Encounter for screening for malignant neoplasm of prostate: Secondary | ICD-10-CM | POA: Diagnosis not present

## 2016-08-13 LAB — URINALYSIS, ROUTINE W REFLEX MICROSCOPIC
BILIRUBIN UA: NEGATIVE
GLUCOSE, UA: NEGATIVE
KETONES UA: NEGATIVE
LEUKOCYTES UA: NEGATIVE
Nitrite, UA: NEGATIVE
PROTEIN UA: NEGATIVE
RBC, UA: NEGATIVE
SPEC GRAV UA: 1.02 (ref 1.005–1.030)
Urobilinogen, Ur: 0.2 mg/dL (ref 0.2–1.0)
pH, UA: 5 (ref 5.0–7.5)

## 2016-08-13 LAB — MICROSCOPIC EXAMINATION
BACTERIA UA: NONE SEEN
RBC, UA: NONE SEEN /hpf (ref 0–?)
WBC, UA: NONE SEEN /hpf (ref 0–?)

## 2016-08-13 MED ORDER — ROSUVASTATIN CALCIUM 5 MG PO TABS: 5.0000 mg | ORAL_TABLET | Freq: Every day | ORAL | 4 refills | Status: DC

## 2016-08-13 MED ORDER — LOSARTAN POTASSIUM-HCTZ 100-12.5 MG PO TABS: 1.0000 | ORAL_TABLET | Freq: Every day | ORAL | 4 refills | Status: DC

## 2016-08-13 NOTE — Assessment & Plan Note (Signed)
The current medical regimen is effective;  continue present plan and medications.  

## 2016-08-13 NOTE — Progress Notes (Signed)
BP 115/77   Pulse (!) 59   Resp 12   Ht 5' 9.88" (1.775 m)   Wt 229 lb 9.6 oz (104.1 kg)   SpO2 98%   BMI 33.06 kg/m    Subjective:    Patient ID: Paul Rodriguez, male    DOB: 1951/02/01, 66 y.o.   MRN: [id number]  HPI: Paul Rodriguez is a 66 y.o. male  Chief Complaint  Patient presents with  . Annual Exam  . Back Pain    Injuried x 1 month ago putting things up.    Patient doing really well has been actively dieting and losing weight. Has had no real issues with weight loss and is doing well. Cholesterol doing well with Crestor 5 mg. Blood pressure doing well is actually low today on losartan HCT 100/12.5.  Relevant past medical, surgical, family and social history reviewed and updated as indicated. Interim medical history since our last visit reviewed. Allergies and medications reviewed and updated.  Review of Systems  Constitutional: Negative.   HENT: Negative.   Eyes: Negative.   Respiratory: Negative.   Cardiovascular: Negative.   Gastrointestinal: Negative.   Endocrine: Negative.   Genitourinary: Negative.   Musculoskeletal: Negative.   Skin: Negative.   Allergic/Immunologic: Negative.   Neurological: Negative.   Hematological: Negative.   Psychiatric/Behavioral: Negative.     Per HPI unless specifically indicated above     Objective:    BP 115/77   Pulse (!) 59   Resp 12   Ht 5' 9.88" (1.775 m)   Wt 229 lb 9.6 oz (104.1 kg)   SpO2 98%   BMI 33.06 kg/m   Wt Readings from Last 3 Encounters:  08/13/16 229 lb 9.6 oz (104.1 kg)  04/04/16 240 lb 11.2 oz (109.2 kg)  02/23/16 237 lb 9.6 oz (107.8 kg)    Physical Exam  Constitutional: He is oriented to person, place, and time. He appears well-developed and well-nourished.  HENT:  Head: Normocephalic and atraumatic.  Right Ear: External ear normal.  Left Ear: External ear normal.  Eyes: Conjunctivae and EOM are normal. Pupils are equal, round, and reactive to light.  Neck: Normal range of  motion. Neck supple.  Cardiovascular: Normal rate, regular rhythm, normal heart sounds and intact distal pulses.   Pulmonary/Chest: Effort normal and breath sounds normal.  Abdominal: Soft. Bowel sounds are normal. There is no splenomegaly or hepatomegaly.  Genitourinary: Rectum normal and penis normal.  Genitourinary Comments: BPH mild changes  Musculoskeletal: Normal range of motion.  Neurological: He is alert and oriented to person, place, and time. He has normal reflexes.  Skin: No rash noted. No erythema.  Psychiatric: He has a normal mood and affect. His behavior is normal. Judgment and thought content normal.        Assessment & Plan:   Problem List Items Addressed This Visit      Cardiovascular and Mediastinum   Essential hypertension    The current medical regimen is effective;  continue present plan and medications.       Relevant Medications   losartan-hydrochlorothiazide (HYZAAR) 100-12.5 MG tablet   rosuvastatin (CRESTOR) 5 MG tablet   Other Relevant Orders   CBC with Differential/Platelet   Comprehensive metabolic panel   Urinalysis, Routine w reflex microscopic     Genitourinary   BPH (benign prostatic hyperplasia)    Stable no meds        Other   Hyperlipidemia    The current medical regimen is effective;  continue present plan and medications.       Relevant Medications   losartan-hydrochlorothiazide (HYZAAR) 100-12.5 MG tablet   rosuvastatin (CRESTOR) 5 MG tablet   Other Relevant Orders   CBC with Differential/Platelet   Comprehensive metabolic panel   Lipid panel   Urinalysis, Routine w reflex microscopic    Other Visit Diagnoses    Annual physical exam    -  Primary   Relevant Orders   CBC with Differential/Platelet   Comprehensive metabolic panel   Lipid panel   PSA   TSH   Urinalysis, Routine w reflex microscopic   Encounter for screening for HIV       Prostate cancer screening       Relevant Orders   PSA   Thyroid disorder  screen       Relevant Orders   TSH       Follow up plan: Return in about 6 months (around 02/13/2017) for  Lipids, ALT, AST, BMP.

## 2016-08-13 NOTE — Assessment & Plan Note (Signed)
Stable. no meds. 

## 2016-08-14 ENCOUNTER — Encounter: Payer: Self-pay | Admitting: Family Medicine

## 2016-08-14 LAB — PSA: PROSTATE SPECIFIC AG, SERUM: 0.8 ng/mL (ref 0.0–4.0)

## 2016-08-14 LAB — COMPREHENSIVE METABOLIC PANEL
A/G RATIO: 2 (ref 1.2–2.2)
ALBUMIN: 4.3 g/dL (ref 3.6–4.8)
ALT: 17 IU/L (ref 0–44)
AST: 23 IU/L (ref 0–40)
Alkaline Phosphatase: 74 IU/L (ref 39–117)
BUN/Creatinine Ratio: 12 (ref 10–24)
BUN: 14 mg/dL (ref 8–27)
Bilirubin Total: 0.9 mg/dL (ref 0.0–1.2)
CALCIUM: 9.2 mg/dL (ref 8.6–10.2)
CO2: 24 mmol/L (ref 18–29)
CREATININE: 1.16 mg/dL (ref 0.76–1.27)
Chloride: 102 mmol/L (ref 96–106)
GFR, EST AFRICAN AMERICAN: 77 mL/min/{1.73_m2} (ref >59)
GFR, EST NON AFRICAN AMERICAN: 66 mL/min/{1.73_m2} (ref >59)
GLOBULIN, TOTAL: 2.2 g/dL (ref 1.5–4.5)
Glucose: 86 mg/dL (ref 65–99)
POTASSIUM: 3.8 mmol/L (ref 3.5–5.2)
SODIUM: 142 mmol/L (ref 134–144)
TOTAL PROTEIN: 6.5 g/dL (ref 6.0–8.5)

## 2016-08-14 LAB — CBC WITH DIFFERENTIAL/PLATELET
BASOS: 1 %
Basophils Absolute: 0 10*3/uL (ref 0.0–0.2)
EOS (ABSOLUTE): 0.1 10*3/uL (ref 0.0–0.4)
EOS: 3 %
HEMATOCRIT: 43.3 % (ref 37.5–51.0)
HEMOGLOBIN: 14.9 g/dL (ref 13.0–17.7)
IMMATURE GRANULOCYTES: 0 %
Immature Grans (Abs): 0 10*3/uL (ref 0.0–0.1)
Lymphocytes Absolute: 2.5 10*3/uL (ref 0.7–3.1)
Lymphs: 44 %
MCH: 30.8 pg (ref 26.6–33.0)
MCHC: 34.4 g/dL (ref 31.5–35.7)
MCV: 90 fL (ref 79–97)
MONOCYTES: 9 %
MONOS ABS: 0.5 10*3/uL (ref 0.1–0.9)
NEUTROS PCT: 43 %
Neutrophils Absolute: 2.4 10*3/uL (ref 1.4–7.0)
Platelets: 189 10*3/uL (ref 150–379)
RBC: 4.83 x10E6/uL (ref 4.14–5.80)
RDW: 13.8 % (ref 12.3–15.4)
WBC: 5.6 10*3/uL (ref 3.4–10.8)

## 2016-08-14 LAB — LIPID PANEL
CHOL/HDL RATIO: 2.3 ratio (ref 0.0–5.0)
Cholesterol, Total: 101 mg/dL (ref 100–199)
HDL: 44 mg/dL (ref >39)
LDL Calculated: 38 mg/dL (ref 0–99)
Triglycerides: 95 mg/dL (ref 0–149)
VLDL Cholesterol Cal: 19 mg/dL (ref 5–40)

## 2016-08-14 LAB — TSH: TSH: 2.41 u[IU]/mL (ref 0.450–4.500)

## 2016-09-08 ENCOUNTER — Other Ambulatory Visit: Payer: Self-pay | Admitting: Family Medicine

## 2016-12-27 ENCOUNTER — Encounter: Payer: Self-pay | Admitting: Family Medicine

## 2016-12-27 ENCOUNTER — Ambulatory Visit (INDEPENDENT_AMBULATORY_CARE_PROVIDER_SITE_OTHER): Payer: 59 | Admitting: Family Medicine

## 2016-12-27 VITALS — BP 117/78 | HR 70 | Temp 98.1°F | Wt 234.0 lb

## 2016-12-27 DIAGNOSIS — R52 Pain, unspecified: Secondary | ICD-10-CM | POA: Diagnosis not present

## 2016-12-27 MED ORDER — DOXYCYCLINE HYCLATE 100 MG PO TABS: 100.0000 mg | ORAL_TABLET | Freq: Two times a day (BID) | ORAL | 0 refills | Status: DC

## 2016-12-27 MED ORDER — TRIAMCINOLONE ACETONIDE 40 MG/ML IJ SUSP
40.0000 mg | Freq: Once | INTRAMUSCULAR | Status: AC
  Administered 2016-12-27: 40 mg via INTRAMUSCULAR

## 2016-12-27 NOTE — Progress Notes (Signed)
   BP 117/78   Pulse 70   Temp 98.1 F (36.7 C)   Wt 234 lb (106.1 kg)   SpO2 97%   BMI 33.69 kg/m    Subjective:    Patient ID: Paul Rodriguez, male    DOB: Jun 07, 1950, 66 y.o.   MRN: [id number]  HPI: Paul Rodriguez is a 66 y.o. male  Chief Complaint  Patient presents with  . Generalized Body Aches    Had tick bite 2 months ago.now over last 3-4 weeks his whole body hurts. Went to [facility] and was checked for tick labs, given Doxy. Was told tests were negative. Wasn't improving much. Body aches were much worse this morning.  . Prostatitis    Went to [facility] and had enlarged prostate and was given Flomax and Levaquin.    Patient presents with persistent body aches and fatigue for the past month or so. Went to [facility] about a week ago for these sxs and was given 10 days of doxycycline given his sxs and hx of tick bite about 2 months ago. Tick labs came back normal. Denies fever, chills, N/V/D, rashes, HAs. Taking prn NSAIDs with some relief.   Also currently being treated with levaquin and flomax for prostatitis by his Urologist. No improvement yet with this course, but started medications just yesterday.   Relevant past medical, surgical, family and social history reviewed and updated as indicated. Interim medical history since our last visit reviewed. Allergies and medications reviewed and updated.  Review of Systems  Constitutional: Positive for fatigue.  HENT: Negative.   Respiratory: Negative.   Cardiovascular: Negative.   Gastrointestinal: Negative.   Genitourinary: Positive for frequency.  Musculoskeletal: Positive for myalgias.  Neurological: Negative.   Psychiatric/Behavioral: Negative.    Per HPI unless specifically indicated above     Objective:    BP 117/78   Pulse 70   Temp 98.1 F (36.7 C)   Wt 234 lb (106.1 kg)   SpO2 97%   BMI 33.69 kg/m   Wt Readings from Last 3 Encounters:  12/27/16 234 lb (106.1 kg)  08/13/16 229 lb 9.6 oz (104.1 kg)  04/04/16 240 lb 11.2  oz (109.2 kg)    Physical Exam  Constitutional: He is oriented to person, place, and time. He appears well-developed and well-nourished. No distress.  HENT:  Head: Atraumatic.  Eyes: Pupils are equal, round, and reactive to light. Conjunctivae are normal.  Neck: Normal range of motion. Neck supple.  Cardiovascular: Normal rate and normal heart sounds.   Pulmonary/Chest: Effort normal and breath sounds normal. No respiratory distress.  Musculoskeletal: Normal range of motion.  Neurological: He is alert and oriented to person, place, and time.  Skin: Skin is warm and dry.  Psychiatric: He has a normal mood and affect. His behavior is normal.  Nursing note and vitals reviewed.     Assessment & Plan:   Problem List Items Addressed This Visit    None    Visit Diagnoses    Body aches    -  Primary   Will extend course of doxy in case false negative for Lyme given quality of sxs. Supportive care reviewed. IM kenalog given today. F/u if no improvement   Relevant Medications   triamcinolone acetonide (KENALOG-40) injection 40 mg (Completed)       Follow up plan: Return for as scheduled.

## 2016-12-30 NOTE — Patient Instructions (Signed)
Follow up as scheduled.  

## 2017-01-16 ENCOUNTER — Encounter: Payer: Self-pay | Admitting: Family Medicine

## 2017-01-16 ENCOUNTER — Ambulatory Visit (INDEPENDENT_AMBULATORY_CARE_PROVIDER_SITE_OTHER): Payer: 59 | Admitting: Family Medicine

## 2017-01-16 VITALS — BP 116/80 | HR 60 | Temp 98.3°F | Wt 222.0 lb

## 2017-01-16 DIAGNOSIS — M791 Myalgia, unspecified site: Secondary | ICD-10-CM

## 2017-01-16 MED ORDER — PREDNISONE 10 MG PO TABS: ORAL_TABLET | ORAL | 0 refills | Status: DC

## 2017-01-16 MED ORDER — CYCLOBENZAPRINE HCL 10 MG PO TABS: 10.0000 mg | ORAL_TABLET | Freq: Three times a day (TID) | ORAL | 0 refills | Status: DC | PRN

## 2017-01-16 NOTE — Progress Notes (Signed)
BP 116/80   Pulse 60   Temp 98.3 F (36.8 C) (Oral)   Wt 222 lb (100.7 kg)   SpO2 94%   BMI 31.96 kg/m    Subjective:    Patient ID: Paul Rodriguez, male    DOB: 1951/04/12, 66 y.o.   MRN: [id number]  HPI: Paul Rodriguez is a 66 y.o. male  Chief Complaint  Patient presents with  . Generalized Body Aches    Some better, but not better.   Pt with persisting generalized myalgias after finishing extended course of doxycycline. Getting good relief with NSAIDs. Has had difficulties in the past with statins, so d/c'd his crestor the past week and didn't note any benefit. Has seen his chiropractor several times over the past month with no relief. No new sxs, just muscle aches and malaise.   Relevant past medical, surgical, family and social history reviewed and updated as indicated. Interim medical history since our last visit reviewed. Allergies and medications reviewed and updated.  Review of Systems  Constitutional: Positive for fatigue.  Respiratory: Negative.   Cardiovascular: Negative.   Gastrointestinal: Negative.   Musculoskeletal: Positive for myalgias.  Neurological: Negative.   Psychiatric/Behavioral: Negative.    Per HPI unless specifically indicated above     Objective:    BP 116/80   Pulse 60   Temp 98.3 F (36.8 C) (Oral)   Wt 222 lb (100.7 kg)   SpO2 94%   BMI 31.96 kg/m   Wt Readings from Last 3 Encounters:  01/16/17 222 lb (100.7 kg)  12/27/16 234 lb (106.1 kg)  08/13/16 229 lb 9.6 oz (104.1 kg)    Physical Exam  Constitutional: He is oriented to person, place, and time. He appears well-developed and well-nourished.  HENT:  Head: Atraumatic.  Eyes: Pupils are equal, round, and reactive to light. Conjunctivae are normal.  Neck: Normal range of motion. Neck supple.  Cardiovascular: Normal rate and normal heart sounds.   Pulmonary/Chest: Effort normal and breath sounds normal. No respiratory distress.  Musculoskeletal: Normal range of motion.    Neurological: He is alert and oriented to person, place, and time.  Skin: Skin is warm and dry.  Psychiatric: He has a normal mood and affect. His behavior is normal.  Nursing note and vitals reviewed.   Results for orders placed or performed in visit on 01/16/17  Sed Rate (ESR)  Result Value Ref Range   Sed Rate 11 0 - 30 mm/hr  CBC with Differential/Platelet  Result Value Ref Range   WBC 9.3 3.4 - 10.8 x10E3/uL   RBC 4.98 4.14 - 5.80 x10E6/uL   Hemoglobin 15.2 13.0 - 17.7 g/dL   Hematocrit 43.7 37.5 - 51.0 %   MCV 88 79 - 97 fL   MCH 30.5 26.6 - 33.0 pg   MCHC 34.8 31.5 - 35.7 g/dL   RDW 13.7 12.3 - 15.4 %   Platelets 213 150 - 379 x10E3/uL   Neutrophils 69 Not Estab. %   Lymphs 21 Not Estab. %   Monocytes 9 Not Estab. %   Eos 1 Not Estab. %   Basos 0 Not Estab. %   Neutrophils Absolute 6.4 1.4 - 7.0 x10E3/uL   Lymphocytes Absolute 1.9 0.7 - 3.1 x10E3/uL   Monocytes Absolute 0.8 0.1 - 0.9 x10E3/uL   EOS (ABSOLUTE) 0.1 0.0 - 0.4 x10E3/uL   Basophils Absolute 0.0 0.0 - 0.2 x10E3/uL   Immature Granulocytes 0 Not Estab. %   Immature Grans (Abs) 0.0 0.0 -  0.1 x10E3/uL  Comprehensive metabolic panel  Result Value Ref Range   Glucose 89 65 - 99 mg/dL   BUN 22 8 - 27 mg/dL   Creatinine, Ser 1.08 0.76 - 1.27 mg/dL   GFR calc non Af Amer 72 >59 mL/min/1.73   GFR calc Af Amer 83 >59 mL/min/1.73   BUN/Creatinine Ratio 20 10 - 24   Sodium 138 134 - 144 mmol/L   Potassium 4.5 3.5 - 5.2 mmol/L   Chloride 102 96 - 106 mmol/L   CO2 21 20 - 29 mmol/L   Calcium 9.1 8.6 - 10.2 mg/dL   Total Protein 6.9 6.0 - 8.5 g/dL   Albumin 4.3 3.6 - 4.8 g/dL   Globulin, Total 2.6 1.5 - 4.5 g/dL   Albumin/Globulin Ratio 1.7 1.2 - 2.2   Bilirubin Total 0.6 0.0 - 1.2 mg/dL   Alkaline Phosphatase 89 39 - 117 IU/L   AST 24 0 - 40 IU/L   ALT 23 0 - 44 IU/L  ANA w/Reflex  Result Value Ref Range   Anit Nuclear Antibody(ANA) Negative Negative  Rheumatoid Factor  Result Value Ref Range    Rhuematoid fact SerPl-aCnc <10.0 0.0 - 13.9 IU/mL      Assessment & Plan:   Problem List Items Addressed This Visit    None    Visit Diagnoses    Myalgia    -  Primary   Await rheumatologic/inflammatory lab results. Unclear etiology at this time. Flexeril and NSAIDs, muscle rubs, epsom salt soaks. Return precautions reviewed   Relevant Orders   Sed Rate (ESR) (Completed)   CBC with Differential/Platelet (Completed)   Comprehensive metabolic panel (Completed)   ANA w/Reflex (Completed)   Rheumatoid Factor (Completed)       Follow up plan: Return for as scheduled.

## 2017-01-17 ENCOUNTER — Telehealth: Payer: Self-pay | Admitting: Family Medicine

## 2017-01-17 LAB — CBC WITH DIFFERENTIAL/PLATELET
BASOS ABS: 0 10*3/uL (ref 0.0–0.2)
Basos: 0 %
EOS (ABSOLUTE): 0.1 10*3/uL (ref 0.0–0.4)
EOS: 1 %
HEMATOCRIT: 43.7 % (ref 37.5–51.0)
HEMOGLOBIN: 15.2 g/dL (ref 13.0–17.7)
Immature Grans (Abs): 0 10*3/uL (ref 0.0–0.1)
Immature Granulocytes: 0 %
LYMPHS ABS: 1.9 10*3/uL (ref 0.7–3.1)
Lymphs: 21 %
MCH: 30.5 pg (ref 26.6–33.0)
MCHC: 34.8 g/dL (ref 31.5–35.7)
MCV: 88 fL (ref 79–97)
MONOCYTES: 9 %
Monocytes Absolute: 0.8 10*3/uL (ref 0.1–0.9)
NEUTROS ABS: 6.4 10*3/uL (ref 1.4–7.0)
Neutrophils: 69 %
Platelets: 213 10*3/uL (ref 150–379)
RBC: 4.98 x10E6/uL (ref 4.14–5.80)
RDW: 13.7 % (ref 12.3–15.4)
WBC: 9.3 10*3/uL (ref 3.4–10.8)

## 2017-01-17 LAB — COMPREHENSIVE METABOLIC PANEL
A/G RATIO: 1.7 (ref 1.2–2.2)
ALT: 23 IU/L (ref 0–44)
AST: 24 IU/L (ref 0–40)
Albumin: 4.3 g/dL (ref 3.6–4.8)
Alkaline Phosphatase: 89 IU/L (ref 39–117)
BUN/Creatinine Ratio: 20 (ref 10–24)
BUN: 22 mg/dL (ref 8–27)
Bilirubin Total: 0.6 mg/dL (ref 0.0–1.2)
CALCIUM: 9.1 mg/dL (ref 8.6–10.2)
CO2: 21 mmol/L (ref 20–29)
Chloride: 102 mmol/L (ref 96–106)
Creatinine, Ser: 1.08 mg/dL (ref 0.76–1.27)
GFR calc non Af Amer: 72 mL/min/{1.73_m2} (ref >59)
GFR, EST AFRICAN AMERICAN: 83 mL/min/{1.73_m2} (ref >59)
GLOBULIN, TOTAL: 2.6 g/dL (ref 1.5–4.5)
Glucose: 89 mg/dL (ref 65–99)
POTASSIUM: 4.5 mmol/L (ref 3.5–5.2)
SODIUM: 138 mmol/L (ref 134–144)
TOTAL PROTEIN: 6.9 g/dL (ref 6.0–8.5)

## 2017-01-17 LAB — RHEUMATOID FACTOR: Rhuematoid fact SerPl-aCnc: <10 IU/mL (ref 0.0–13.9)

## 2017-01-17 LAB — ANA W/REFLEX: Anti Nuclear Antibody(ANA): NEGATIVE

## 2017-01-17 LAB — SEDIMENTATION RATE: SED RATE: 11 mm/h (ref 0–30)

## 2017-01-17 NOTE — Telephone Encounter (Signed)
Every lab came back normal, continue with the prednisone and let's see how that goes

## 2017-01-17 NOTE — Telephone Encounter (Signed)
Message relayed to patient. Verbalized understanding and denied questions.   

## 2017-01-19 NOTE — Patient Instructions (Signed)
Follow up as needed

## 2017-01-30 ENCOUNTER — Encounter: Payer: Self-pay | Admitting: Family Medicine

## 2017-01-30 ENCOUNTER — Ambulatory Visit (INDEPENDENT_AMBULATORY_CARE_PROVIDER_SITE_OTHER): Payer: 59 | Admitting: Family Medicine

## 2017-01-30 VITALS — BP 125/86 | HR 72 | Temp 98.3°F | Wt 229.8 lb

## 2017-01-30 DIAGNOSIS — M791 Myalgia, unspecified site: Secondary | ICD-10-CM

## 2017-01-30 NOTE — Progress Notes (Signed)
BP 125/86   Pulse 72   Temp 98.3 F (36.8 C)   Wt 229 lb 12.8 oz (104.2 kg)   SpO2 97%   BMI 33.08 kg/m    Subjective:    Patient ID: Paul Rodriguez, male    DOB: Mar 20, 1951, 66 y.o.   MRN: [id number]  HPI: Paul Rodriguez is a 66 y.o. male  Chief Complaint  Patient presents with  . Generalized Body Aches    pt states he is still having body aches, states he took the 12 days of prednisone and felt very good but started feeling bad again as soon as he quit taking it    Patient presents to f/u on persistent generalized body aches. Completed 12 day prednisone taper 3 days ago and slowly the sxs returned once off the medication. Continues to deny fevers, sweats, rashes, injury, new activities. Previous lab workup reviewed, basic inflammatory markers and autoimmune screening labs negative, liver and kidney function normal. Previously did trial off statin with no relief. Has been on extended course of doxycycline to cover for tick illness, labs negative and no improvement on abx. Taking OTC pain relievers with some relief.   Past Medical History:  Diagnosis Date  . Allergy   . Arthritis   . Depression   . GERD (gastroesophageal reflux disease)   . History of kidney stones   . Hyperlipidemia    Social History   Social History  . Marital status: Married    Spouse name: N/A  . Number of children: N/A  . Years of education: N/A   Occupational History  . Not on file.   Social History Main Topics  . Smoking status: Former Smoker    Quit date: 05/22/1991  . Smokeless tobacco: Never Used  . Alcohol use Yes     Comment: on occasion/socially  . Drug use: No  . Sexual activity: Yes   Other Topics Concern  . Not on file   Social History Narrative  . No narrative on file    Relevant past medical, surgical, family and social history reviewed and updated as indicated. Interim medical history since our last visit reviewed. Allergies and medications reviewed and  updated.  Review of Systems  Constitutional: Negative.   HENT: Negative.   Respiratory: Negative.   Cardiovascular: Negative.   Gastrointestinal: Negative.   Musculoskeletal: Positive for myalgias.  Skin: Negative.   Neurological: Negative.   Psychiatric/Behavioral: Negative.    Per HPI unless specifically indicated above     Objective:    BP 125/86   Pulse 72   Temp 98.3 F (36.8 C)   Wt 229 lb 12.8 oz (104.2 kg)   SpO2 97%   BMI 33.08 kg/m   Wt Readings from Last 3 Encounters:  01/30/17 229 lb 12.8 oz (104.2 kg)  01/16/17 222 lb (100.7 kg)  12/27/16 234 lb (106.1 kg)    Physical Exam  Constitutional: He is oriented to person, place, and time. He appears well-developed and well-nourished. No distress.  HENT:  Head: Atraumatic.  Eyes: Conjunctivae are normal. No scleral icterus.  Neck: Normal range of motion. Neck supple.  Cardiovascular: Normal rate and normal heart sounds.   Pulmonary/Chest: Effort normal and breath sounds normal. No respiratory distress.  Musculoskeletal: Normal range of motion.  Neurological: He is alert and oriented to person, place, and time.  Skin: Skin is warm and dry.  Psychiatric: He has a normal mood and affect. His behavior is normal.  Nursing note and vitals  reviewed.     Assessment & Plan:   Problem List Items Addressed This Visit    None    Visit Diagnoses    Myalgia    -  Primary   Unclear etiology, not improving with time. Will refer to Rheumatology for further evaluation. Continue flexeril prn and OTC pain relievers, epsom soaks   Relevant Orders   Ambulatory referral to Rheumatology       Follow up plan: Return for as scheduled.

## 2017-01-30 NOTE — Patient Instructions (Signed)
Follow up as scheduled.  

## 2017-01-31 ENCOUNTER — Telehealth: Payer: Self-pay | Admitting: Family Medicine

## 2017-01-31 NOTE — Telephone Encounter (Signed)
Patient would like a status update on his referral. Informed patient referral was put in. Patient would like to know a range for time it may take office to contact him with an appointment.  Please Advise.  Thank you

## 2017-01-31 NOTE — Telephone Encounter (Signed)
Called and spoke to patient. Advised that it can take 7-10 business days for the other office to contact his to schedule appointment. Also advised that I was unsure how inclement weather would affect this. Pt stated he did not want to wait that long to schedule an appointment, he stated he was going to call around to see if he could get an appointment with a rheumatology office. Pt stated he would call back. Will close encounter at this time.

## 2017-02-06 ENCOUNTER — Other Ambulatory Visit: Payer: Self-pay | Admitting: Family Medicine

## 2017-02-06 MED ORDER — CYCLOBENZAPRINE HCL 10 MG PO TABS
10.0000 mg | ORAL_TABLET | Freq: Three times a day (TID) | ORAL | 0 refills | Status: DC | PRN
Start: 2017-02-06 — End: 2017-02-14

## 2017-02-06 NOTE — Telephone Encounter (Signed)
Routing to provider for med refill.

## 2017-02-06 NOTE — Telephone Encounter (Signed)
Looks like his referral is still pending - can you look into getting this pushed through since we don't have someone on referrals for now? I sent over his flexeril

## 2017-02-06 NOTE — Telephone Encounter (Signed)
Left message that med was refilled.   Alwyn Ren, can you please check into his referral?

## 2017-02-06 NOTE — Telephone Encounter (Signed)
Patient  Would like to see if he can get a medication for his muscle pain.   Patient also called to check the status of his referral. Informed patient of same message CMA provided him in regards to referral.  Patient would still like to  See if anyone could check on status of referral if possible.  Please Advise.  Thank you

## 2017-02-08 NOTE — Telephone Encounter (Signed)
Patient notified they will be calling him with an appt time.

## 2017-02-08 NOTE — Telephone Encounter (Signed)
Adriane returned [employer].  Just FYI  [phone]

## 2017-02-08 NOTE — Telephone Encounter (Signed)
Faxing referral to [facility], They will call and schedule appointment.   Thank you!

## 2017-02-08 NOTE — Telephone Encounter (Signed)
Keri, can you look into the status of his referral and let him know? Thanks!

## 2017-02-13 DIAGNOSIS — E559 Vitamin D deficiency, unspecified: Secondary | ICD-10-CM | POA: Diagnosis not present

## 2017-02-13 DIAGNOSIS — M791 Myalgia: Secondary | ICD-10-CM | POA: Diagnosis not present

## 2017-02-14 ENCOUNTER — Ambulatory Visit (INDEPENDENT_AMBULATORY_CARE_PROVIDER_SITE_OTHER): Payer: 59 | Admitting: Family Medicine

## 2017-02-14 VITALS — BP 118/82 | HR 76 | Wt 225.0 lb

## 2017-02-14 DIAGNOSIS — I10 Essential (primary) hypertension: Secondary | ICD-10-CM | POA: Diagnosis not present

## 2017-02-14 DIAGNOSIS — E7800 Pure hypercholesterolemia, unspecified: Secondary | ICD-10-CM

## 2017-02-14 LAB — LP+ALT+AST PICCOLO, WAIVED
ALT (SGPT) Piccolo, Waived: 25 U/L (ref 10–47)
AST (SGOT) Piccolo, Waived: 17 U/L (ref 11–38)
CHOL/HDL RATIO PICCOLO,WAIVE: 3.4 mg/dL
CHOLESTEROL PICCOLO, WAIVED: 142 mg/dL (ref <200)
HDL Chol Piccolo, Waived: 41 mg/dL — ABNORMAL LOW (ref >59)
LDL CHOL CALC PICCOLO WAIVED: 74 mg/dL (ref <100)
Triglycerides Piccolo,Waived: 137 mg/dL (ref <150)
VLDL CHOL CALC PICCOLO,WAIVE: 27 mg/dL (ref <30)

## 2017-02-14 NOTE — Assessment & Plan Note (Signed)
The current medical regimen is effective;  continue present plan and medications.  

## 2017-02-14 NOTE — Assessment & Plan Note (Signed)
Doing well off medications will continue to hold Crestor

## 2017-02-14 NOTE — Progress Notes (Signed)
BP 118/82   Pulse 76   Wt 225 lb (102.1 kg)   SpO2 97%   BMI 32.39 kg/m    Subjective:    Patient ID: Paul Rodriguez, male    DOB: 1950/05/31, 66 y.o.   MRN: [id number]  HPI: Paul Rodriguez is a 66 y.o. male  Follow-up rheumatology referral.   Patient still having myalgias. Hasn't heard anything from physical therapy referral to work through myalgias. Reviewed labs that are ordered and why  For patient.  Reviewed [facility] care and treatment  Patient also concerned Crestor may be contributing had stopped Crestor and lipids pending Relevant past medical, surgical, family and social history reviewed and updated as indicated. Interim medical history since our last visit reviewed. Allergies and medications reviewed and updated.  Review of Systems  Constitutional: Negative.   Respiratory: Negative.   Cardiovascular: Negative.     Per HPI unless specifically indicated above     Objective:    BP 118/82   Pulse 76   Wt 225 lb (102.1 kg)   SpO2 97%   BMI 32.39 kg/m   Wt Readings from Last 3 Encounters:  02/14/17 225 lb (102.1 kg)  01/30/17 229 lb 12.8 oz (104.2 kg)  01/16/17 222 lb (100.7 kg)    Physical Exam  Constitutional: He is oriented to person, place, and time. He appears well-developed and well-nourished.  HENT:  Head: Normocephalic and atraumatic.  Eyes: Conjunctivae and EOM are normal.  Neck: Normal range of motion.  Cardiovascular: Normal rate, regular rhythm and normal heart sounds.   Pulmonary/Chest: Effort normal and breath sounds normal.  Musculoskeletal: Normal range of motion.  Neurological: He is alert and oriented to person, place, and time.  Skin: No erythema.  Psychiatric: He has a normal mood and affect. His behavior is normal. Judgment and thought content normal.    Results for orders placed or performed in visit on 01/16/17  Sed Rate (ESR)  Result Value Ref Range   Sed Rate 11 0 - 30 mm/hr  CBC with Differential/Platelet  Result Value Ref  Range   WBC 9.3 3.4 - 10.8 x10E3/uL   RBC 4.98 4.14 - 5.80 x10E6/uL   Hemoglobin 15.2 13.0 - 17.7 g/dL   Hematocrit 43.7 37.5 - 51.0 %   MCV 88 79 - 97 fL   MCH 30.5 26.6 - 33.0 pg   MCHC 34.8 31.5 - 35.7 g/dL   RDW 13.7 12.3 - 15.4 %   Platelets 213 150 - 379 x10E3/uL   Neutrophils 69 Not Estab. %   Lymphs 21 Not Estab. %   Monocytes 9 Not Estab. %   Eos 1 Not Estab. %   Basos 0 Not Estab. %   Neutrophils Absolute 6.4 1.4 - 7.0 x10E3/uL   Lymphocytes Absolute 1.9 0.7 - 3.1 x10E3/uL   Monocytes Absolute 0.8 0.1 - 0.9 x10E3/uL   EOS (ABSOLUTE) 0.1 0.0 - 0.4 x10E3/uL   Basophils Absolute 0.0 0.0 - 0.2 x10E3/uL   Immature Granulocytes 0 Not Estab. %   Immature Grans (Abs) 0.0 0.0 - 0.1 x10E3/uL  Comprehensive metabolic panel  Result Value Ref Range   Glucose 89 65 - 99 mg/dL   BUN 22 8 - 27 mg/dL   Creatinine, Ser 1.08 0.76 - 1.27 mg/dL   GFR calc non Af Amer 72 >59 mL/min/1.73   GFR calc Af Amer 83 >59 mL/min/1.73   BUN/Creatinine Ratio 20 10 - 24   Sodium 138 134 - 144 mmol/L   Potassium  4.5 3.5 - 5.2 mmol/L   Chloride 102 96 - 106 mmol/L   CO2 21 20 - 29 mmol/L   Calcium 9.1 8.6 - 10.2 mg/dL   Total Protein 6.9 6.0 - 8.5 g/dL   Albumin 4.3 3.6 - 4.8 g/dL   Globulin, Total 2.6 1.5 - 4.5 g/dL   Albumin/Globulin Ratio 1.7 1.2 - 2.2   Bilirubin Total 0.6 0.0 - 1.2 mg/dL   Alkaline Phosphatase 89 39 - 117 IU/L   AST 24 0 - 40 IU/L   ALT 23 0 - 44 IU/L  ANA w/Reflex  Result Value Ref Range   Anit Nuclear Antibody(ANA) Negative Negative  Rheumatoid Factor  Result Value Ref Range   Rhuematoid fact SerPl-aCnc <10.0 0.0 - 13.9 IU/mL      Assessment & Plan:   Problem List Items Addressed This Visit      Cardiovascular and Mediastinum   Essential hypertension - Primary    The current medical regimen is effective;  continue present plan and medications.       Relevant Orders   Basic metabolic panel   LP+ALT+AST Piccolo, Waived     Other   Hyperlipidemia    Doing  well off medications will continue to hold Crestor      Relevant Orders   Basic metabolic panel   LP+ALT+AST Piccolo, Waived     Myalgias workup in place encourage physical therapy to see if doesn't help. Muscle strength and muscles loosened up.  Follow up plan: Return for As scheduled.

## 2017-02-15 LAB — BASIC METABOLIC PANEL
BUN/Creatinine Ratio: 14 (ref 10–24)
BUN: 15 mg/dL (ref 8–27)
CALCIUM: 9.4 mg/dL (ref 8.6–10.2)
CO2: 24 mmol/L (ref 20–29)
CREATININE: 1.07 mg/dL (ref 0.76–1.27)
Chloride: 100 mmol/L (ref 96–106)
GFR calc Af Amer: 84 mL/min/{1.73_m2} (ref >59)
GFR, EST NON AFRICAN AMERICAN: 72 mL/min/{1.73_m2} (ref >59)
Glucose: 91 mg/dL (ref 65–99)
POTASSIUM: 4.2 mmol/L (ref 3.5–5.2)
Sodium: 140 mmol/L (ref 134–144)

## 2017-02-19 DIAGNOSIS — M25551 Pain in right hip: Secondary | ICD-10-CM | POA: Diagnosis not present

## 2017-02-19 DIAGNOSIS — M25552 Pain in left hip: Secondary | ICD-10-CM | POA: Diagnosis not present

## 2017-02-27 DIAGNOSIS — M25551 Pain in right hip: Secondary | ICD-10-CM | POA: Diagnosis not present

## 2017-02-27 DIAGNOSIS — M25552 Pain in left hip: Secondary | ICD-10-CM | POA: Diagnosis not present

## 2017-02-28 DIAGNOSIS — M1990 Unspecified osteoarthritis, unspecified site: Secondary | ICD-10-CM | POA: Diagnosis not present

## 2017-02-28 DIAGNOSIS — M353 Polymyalgia rheumatica: Secondary | ICD-10-CM | POA: Diagnosis not present

## 2017-03-04 DIAGNOSIS — M25551 Pain in right hip: Secondary | ICD-10-CM | POA: Diagnosis not present

## 2017-03-04 DIAGNOSIS — M25552 Pain in left hip: Secondary | ICD-10-CM | POA: Diagnosis not present

## 2017-03-13 DIAGNOSIS — M353 Polymyalgia rheumatica: Secondary | ICD-10-CM | POA: Diagnosis not present

## 2017-03-13 DIAGNOSIS — M25552 Pain in left hip: Secondary | ICD-10-CM | POA: Diagnosis not present

## 2017-03-13 DIAGNOSIS — M25551 Pain in right hip: Secondary | ICD-10-CM | POA: Diagnosis not present

## 2017-03-13 DIAGNOSIS — M1990 Unspecified osteoarthritis, unspecified site: Secondary | ICD-10-CM | POA: Diagnosis not present

## 2017-03-18 DIAGNOSIS — M25551 Pain in right hip: Secondary | ICD-10-CM | POA: Diagnosis not present

## 2017-03-18 DIAGNOSIS — M25552 Pain in left hip: Secondary | ICD-10-CM | POA: Diagnosis not present

## 2017-03-21 DIAGNOSIS — M25551 Pain in right hip: Secondary | ICD-10-CM | POA: Diagnosis not present

## 2017-03-21 DIAGNOSIS — M25552 Pain in left hip: Secondary | ICD-10-CM | POA: Diagnosis not present

## 2017-04-08 DIAGNOSIS — M5134 Other intervertebral disc degeneration, thoracic region: Secondary | ICD-10-CM | POA: Diagnosis not present

## 2017-04-08 DIAGNOSIS — M1990 Unspecified osteoarthritis, unspecified site: Secondary | ICD-10-CM | POA: Diagnosis not present

## 2017-04-08 DIAGNOSIS — M9902 Segmental and somatic dysfunction of thoracic region: Secondary | ICD-10-CM | POA: Diagnosis not present

## 2017-04-08 DIAGNOSIS — M9901 Segmental and somatic dysfunction of cervical region: Secondary | ICD-10-CM | POA: Diagnosis not present

## 2017-04-08 DIAGNOSIS — M353 Polymyalgia rheumatica: Secondary | ICD-10-CM | POA: Diagnosis not present

## 2017-04-08 DIAGNOSIS — M62830 Muscle spasm of back: Secondary | ICD-10-CM | POA: Diagnosis not present

## 2017-04-10 DIAGNOSIS — M9901 Segmental and somatic dysfunction of cervical region: Secondary | ICD-10-CM | POA: Diagnosis not present

## 2017-04-10 DIAGNOSIS — M9902 Segmental and somatic dysfunction of thoracic region: Secondary | ICD-10-CM | POA: Diagnosis not present

## 2017-04-10 DIAGNOSIS — M5134 Other intervertebral disc degeneration, thoracic region: Secondary | ICD-10-CM | POA: Diagnosis not present

## 2017-04-10 DIAGNOSIS — M62830 Muscle spasm of back: Secondary | ICD-10-CM | POA: Diagnosis not present

## 2017-04-15 DIAGNOSIS — M9901 Segmental and somatic dysfunction of cervical region: Secondary | ICD-10-CM | POA: Diagnosis not present

## 2017-04-15 DIAGNOSIS — M9902 Segmental and somatic dysfunction of thoracic region: Secondary | ICD-10-CM | POA: Diagnosis not present

## 2017-04-15 DIAGNOSIS — M5134 Other intervertebral disc degeneration, thoracic region: Secondary | ICD-10-CM | POA: Diagnosis not present

## 2017-04-15 DIAGNOSIS — M62830 Muscle spasm of back: Secondary | ICD-10-CM | POA: Diagnosis not present

## 2017-04-18 DIAGNOSIS — M5134 Other intervertebral disc degeneration, thoracic region: Secondary | ICD-10-CM | POA: Diagnosis not present

## 2017-04-18 DIAGNOSIS — M62830 Muscle spasm of back: Secondary | ICD-10-CM | POA: Diagnosis not present

## 2017-04-18 DIAGNOSIS — M9901 Segmental and somatic dysfunction of cervical region: Secondary | ICD-10-CM | POA: Diagnosis not present

## 2017-04-18 DIAGNOSIS — M9902 Segmental and somatic dysfunction of thoracic region: Secondary | ICD-10-CM | POA: Diagnosis not present

## 2017-04-23 DIAGNOSIS — E669 Obesity, unspecified: Secondary | ICD-10-CM | POA: Diagnosis not present

## 2017-04-23 DIAGNOSIS — M545 Low back pain: Secondary | ICD-10-CM | POA: Diagnosis not present

## 2017-04-23 DIAGNOSIS — M5136 Other intervertebral disc degeneration, lumbar region: Secondary | ICD-10-CM | POA: Diagnosis not present

## 2017-05-02 ENCOUNTER — Encounter: Payer: Self-pay | Admitting: Emergency Medicine

## 2017-05-02 ENCOUNTER — Emergency Department: Payer: PPO

## 2017-05-02 ENCOUNTER — Emergency Department
Admission: EM | Admit: 2017-05-02 | Discharge: 2017-05-02 | Disposition: A | Discharge status: Home / Self Care | Payer: PPO | Attending: Emergency Medicine | Admitting: Emergency Medicine

## 2017-05-02 DIAGNOSIS — Z87891 Personal history of nicotine dependence: Secondary | ICD-10-CM | POA: Diagnosis not present

## 2017-05-02 DIAGNOSIS — W01198A Fall on same level from slipping, tripping and stumbling with subsequent striking against other object, initial encounter: Secondary | ICD-10-CM | POA: Diagnosis not present

## 2017-05-02 DIAGNOSIS — Y92481 Parking lot as the place of occurrence of the external cause: Secondary | ICD-10-CM | POA: Diagnosis not present

## 2017-05-02 DIAGNOSIS — S0001XA Abrasion of scalp, initial encounter: Secondary | ICD-10-CM | POA: Diagnosis not present

## 2017-05-02 DIAGNOSIS — Y999 Unspecified external cause status: Secondary | ICD-10-CM | POA: Insufficient documentation

## 2017-05-02 DIAGNOSIS — Z79899 Other long term (current) drug therapy: Secondary | ICD-10-CM | POA: Diagnosis not present

## 2017-05-02 DIAGNOSIS — S43101A Unspecified dislocation of right acromioclavicular joint, initial encounter: Secondary | ICD-10-CM | POA: Diagnosis not present

## 2017-05-02 DIAGNOSIS — I10 Essential (primary) hypertension: Secondary | ICD-10-CM | POA: Insufficient documentation

## 2017-05-02 DIAGNOSIS — Z7982 Long term (current) use of aspirin: Secondary | ICD-10-CM | POA: Insufficient documentation

## 2017-05-02 DIAGNOSIS — S4991XA Unspecified injury of right shoulder and upper arm, initial encounter: Secondary | ICD-10-CM | POA: Diagnosis not present

## 2017-05-02 DIAGNOSIS — Y939 Activity, unspecified: Secondary | ICD-10-CM | POA: Diagnosis not present

## 2017-05-02 IMAGING — CR DG SHOULDER 2+V*R*
1 series · 4 of 4 positions shown · non-contrast
Comparison: Chest x-ray [DATE]

CLINICAL DATA: Fell today and injured shoulder.

EXAM:
RIGHT SHOULDER - 2+ VIEW

[Series 1: dg shoulder right · 0.14mm/px · 4 of 4 slices shown]
[im 1/4]
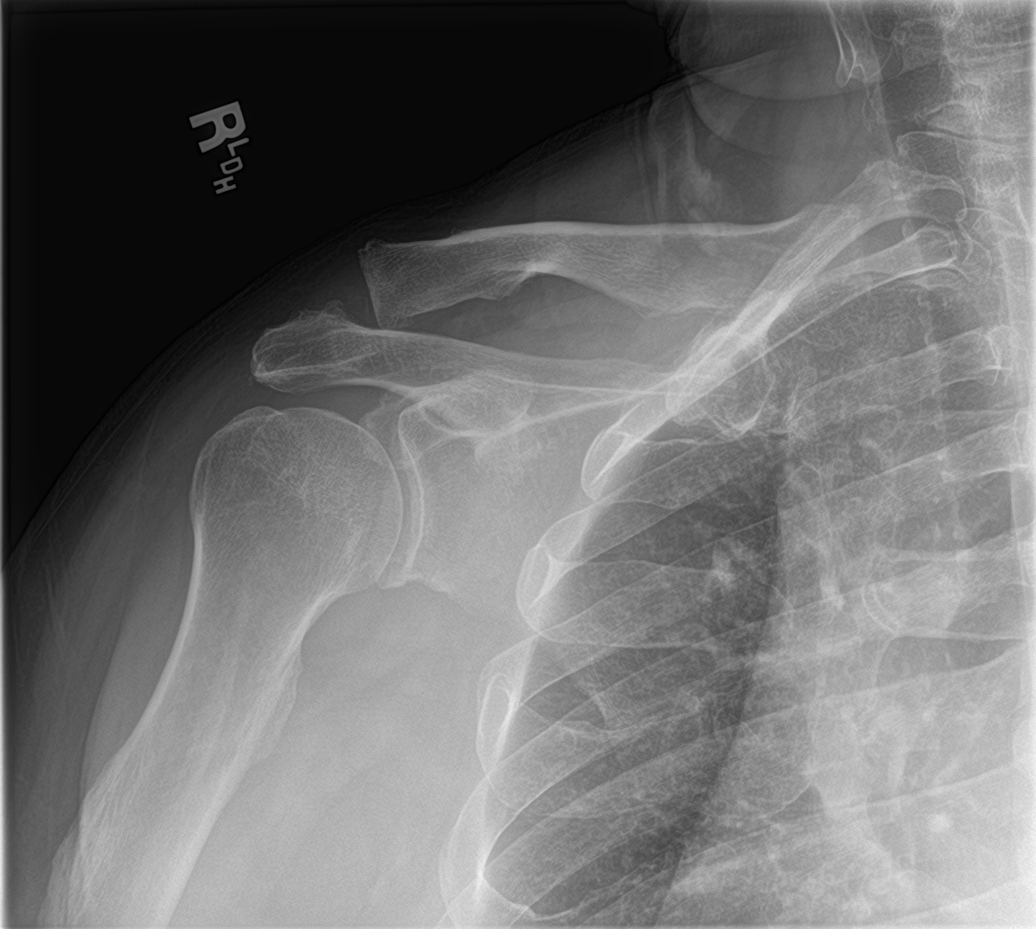
[im 2/4]
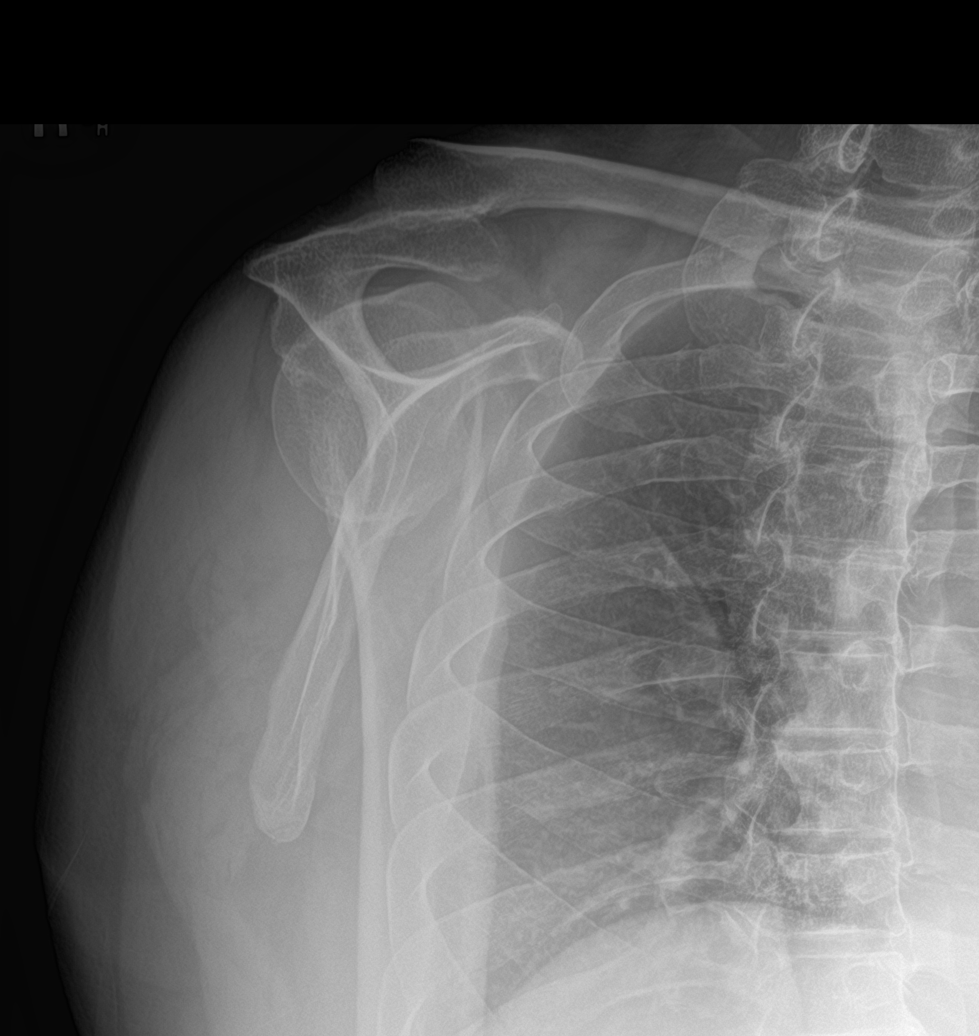
[im 3/4]
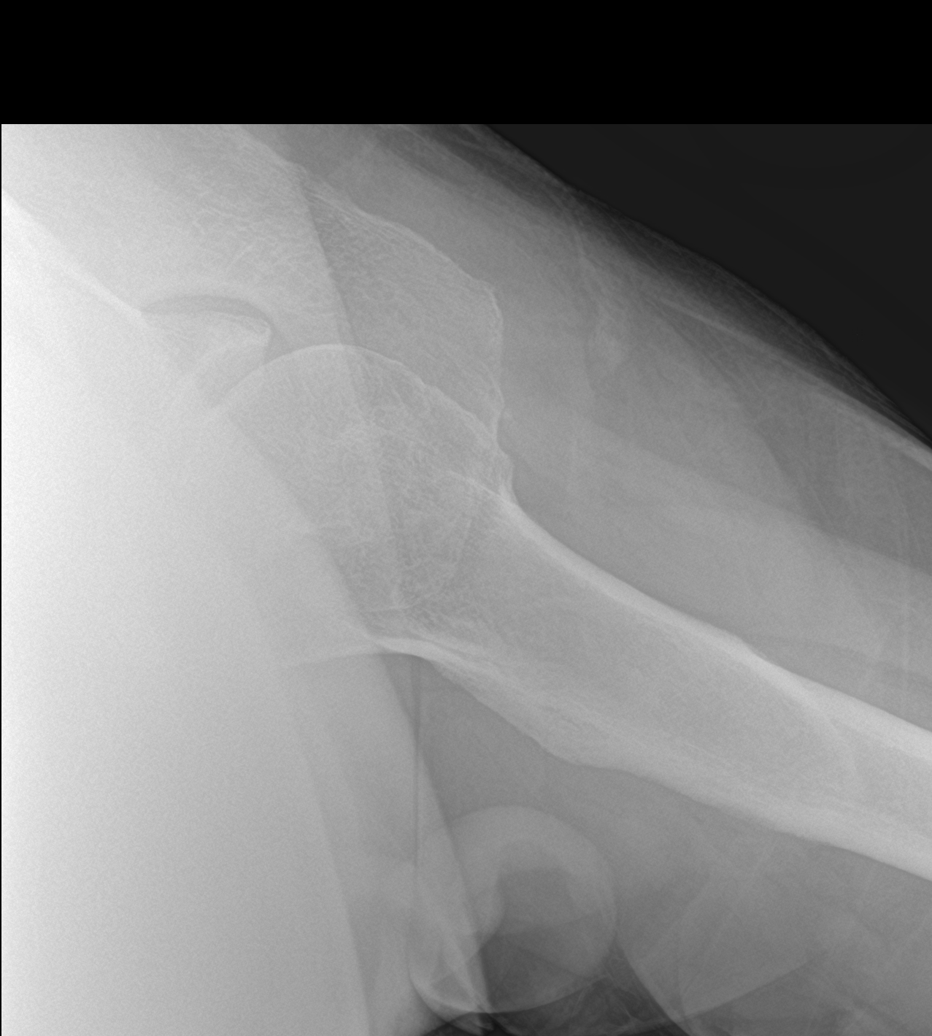
[im 4/4]
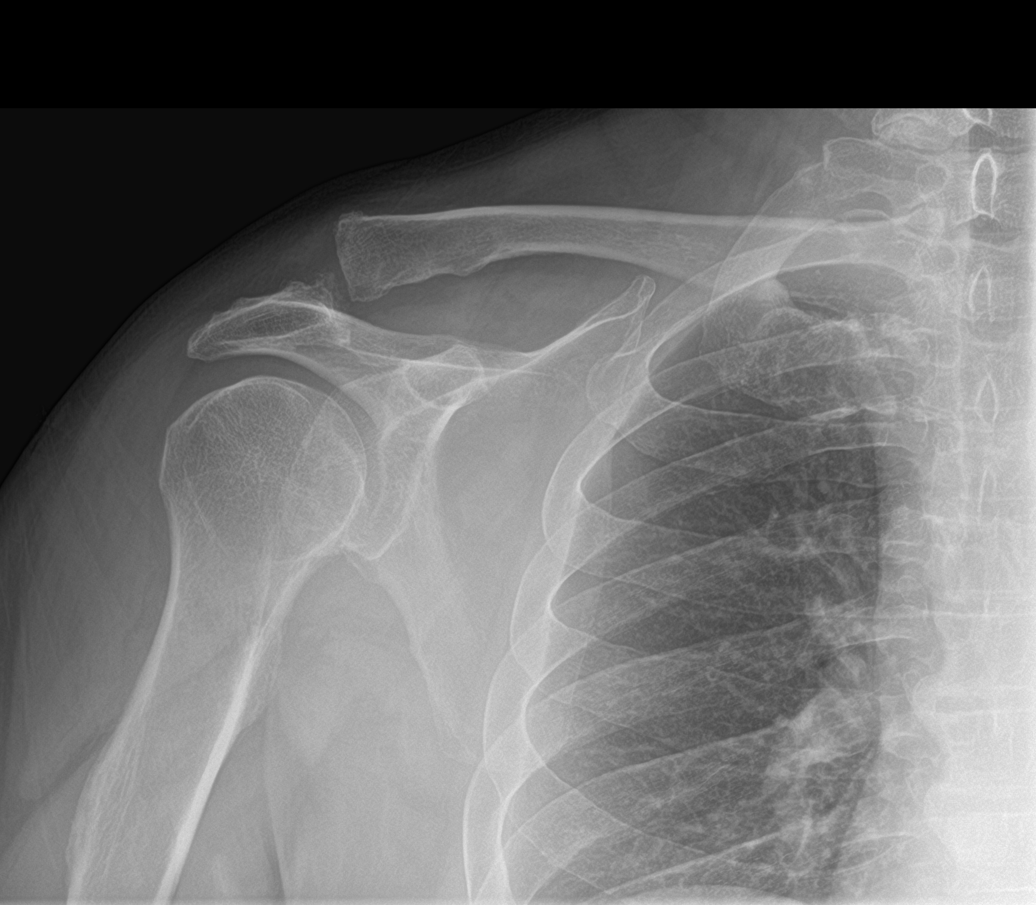

[4 of 4 positions shown; findings below may reference images not displayed]

FINDINGS: Grade 2 AC joint separation. No fracture. Mild to moderate
glenohumeral joint degenerative changes but no dislocation. The
visualized right lung is clear. The visualized right ribs are
intact.
IMPRESSION: Grade 2 AC joint separation.

## 2017-05-02 MED ORDER — OXYCODONE-ACETAMINOPHEN 5-325 MG PO TABS
1.0000 | ORAL_TABLET | Freq: Once | ORAL | Status: AC
  Administered 2017-05-02: 1 via ORAL
  Filled 2017-05-02: qty 1

## 2017-05-02 MED ORDER — OXYCODONE-ACETAMINOPHEN 5-325 MG PO TABS: 1.0000 | ORAL_TABLET | Freq: Four times a day (QID) | ORAL | 0 refills | Status: DC | PRN

## 2017-05-02 NOTE — Discharge Instructions (Signed)
Follow-up with Dr. Marry Guan. Call and make an appointment. Wear sling for comfort. Begin taking Percocet as needed for pain. You may also need to take stool softeners with this medication as it causes constipation. This medication also may cause drowsiness increase your  risk for falling.

## 2017-05-02 NOTE — ED Provider Notes (Signed)
[facility] Emergency Department Provider Note   ____________________________________________   First MD Initiated Contact with Patient 05/02/17 1437     (approximate)  I have reviewed the triage vital signs and the nursing notes.   HISTORY  Chief Complaint Fall   HPI Paul Rodriguez is a 66 y.o. male is brought to the ED today by his wife after patient fell. Patient states she missed a step in the parking lot onto the sidewalk causing him to fall forward. Patient states that his forehead hit the door frame. He denies any loss of consciousness. He also has an injury to his right shoulder. Patient states that since this injury he has had limited range of motion with his arm. He states there is a noticeable deformity of his right shoulder with pain to touch. He denies any neck pain or paresthesias to his extremities. Patient denies any nausea, vomiting,visual changes.currently rates his pain as 10 over 10.   Past Medical History:  Diagnosis Date  . Allergy   . Arthritis   . Depression   . GERD (gastroesophageal reflux disease)   . History of kidney stones   . Hyperlipidemia     Patient Active Problem List   Diagnosis Date Noted  . BPH (benign prostatic hyperplasia) 08/13/2016  . Hyperlipidemia 02/23/2016  . Essential hypertension 08/03/2015    Past Surgical History:  Procedure Laterality Date  . HIP SURGERY Right 2014   arthroscopic  . HIP SURGERY Left Nov 2014   arthroscopic  . KNEE SURGERY Left Nov 2014  . NOSE SURGERY      Prior to Admission medications   Medication Sig Start Date End Date Taking? Authorizing Provider  aspirin EC 81 MG tablet Take 81 mg by mouth daily.    [provider]  EPINEPHrine (EPIPEN 2-PAK) 0.3 mg/0.3 mL IJ SOAJ injection  11/17/12   [provider]  loratadine-pseudoephedrine (CLARITIN-D 12-HOUR) 5-120 MG tablet Take by mouth every 12 (twelve) hours as needed.    [provider]    losartan-hydrochlorothiazide (HYZAAR) 100-12.5 MG tablet Take 1 tablet by mouth daily. 08/13/16   Guadalupe Maple, MD  meloxicam (MOBIC) 7.5 MG tablet  02/13/17   [provider]  mometasone (ELOCON) 0.1 % cream Apply 1 application topically daily. 02/23/16   Guadalupe Maple, MD  oxyCODONE-acetaminophen (PERCOCET) 5-325 MG tablet Take 1 tablet by mouth every 6 (six) hours as needed for moderate pain. 05/02/17   Johnn Hai, PA-C  rosuvastatin (CRESTOR) 5 MG tablet Take 1 tablet (5 mg total) by mouth daily. 08/13/16   Guadalupe Maple, MD  tamsulosin (FLOMAX) 0.4 MG CAPS capsule Take 0.4 mg by mouth daily. 12/26/16   [provider]  tiZANidine (ZANAFLEX) 2 MG tablet  02/13/17   [provider]    Allergies Ultram [tramadol]  Family History  Problem Relation Age of Onset  . Hypertension Mother   . Hypertension Father   . Heart disease Father   . Stroke Father   . Diabetes Maternal Grandfather   . Heart disease Maternal Grandmother   . Cancer Paternal Grandmother   . Cancer Paternal Grandfather     Social History Social History   Tobacco Use  . Smoking status: Former Smoker    Last attempt to quit: 05/22/1991    Years since quitting: 25.9  . Smokeless tobacco: Never Used  Substance Use Topics  . Alcohol use: Yes    Comment: on occasion/socially  . Drug use: No  Review of Systems Constitutional: No fever/chills Eyes: No visual changes. ENT: no trauma. Cardiovascular: Denies chest pain. Respiratory: Denies shortness of breath. Gastrointestinal: No abdominal pain.  No nausea, no vomiting.   Musculoskeletal: positive for right shoulder pain. Skin: positive for superficial abrasion forehead. Neurological: Negative for headaches, focal weakness or numbness. ____________________________________________   PHYSICAL EXAM:  VITAL SIGNS: ED Triage Vitals  Enc Vitals Group     BP 05/02/17 1231 139/82     Pulse Rate 05/02/17 1231 64     Resp  05/02/17 1231 18     Temp 05/02/17 1231 97.9 F (36.6 C)     Temp Source 05/02/17 1231 Oral     SpO2 05/02/17 1231 100 %     Weight 05/02/17 1232 230 lb (104.3 kg)     Height 05/02/17 1232 5\' 9"  (1.753 m)     Head Circumference --      Peak Flow --      Pain Score 05/02/17 1231 10     Pain Loc --      Pain Edu? --      Excl. in [place]? --    Constitutional: Alert and oriented. Well appearing and in no acute distress. Eyes: Conjunctivae are normal.  Head: Atraumatic. Neck: No stridor.  No cervical tenderness on palpation posteriorly. Range of motion is without restriction. Cardiovascular: Normal rate, regular rhythm. Grossly normal heart sounds.  Good peripheral circulation. Respiratory: Normal respiratory effort.  No retractions. Lungs CTAB. Gastrointestinal: Soft and nontender. No distention.  Musculoskeletal: on examination of the right shoulder there is moderate soft tissue swelling anteriorly with marked tenderness on palpation at the anterior clavicular joint area. Range of motion is restricted secondary to pain. Motor sensory function intact distal to the injury. Pulses positive. Skin is intact. Neurologic:  Normal speech and language. No gross focal neurologic deficits are appreciated.  Skin:  Skin is warm, dry.  Superficial abrasion is noted above. Psychiatric: Mood and affect are normal. Speech and behavior are normal.  ____________________________________________   LABS (all labs ordered are listed, but only abnormal results are displayed)  Labs Reviewed - No data to display  RADIOLOGY  Dg Shoulder Right  Result Date: 05/02/2017 CLINICAL DATA:  [place] [address] today and injured shoulder. EXAM: RIGHT SHOULDER - 2+ VIEW COMPARISON:  Chest x-ray 04/24/2016 FINDINGS: Grade 2 AC joint separation. No fracture. Mild to moderate glenohumeral joint degenerative changes but no dislocation. The visualized right lung is clear. The visualized right ribs are intact. IMPRESSION: Grade 2 AC joint  separation. Electronically Signed   By: Marijo Sanes M.D.   On: 05/02/2017 13:07    ____________________________________________   PROCEDURES  Procedure(s) performed: None  Procedures  Critical Care performed: No  ____________________________________________   INITIAL IMPRESSION / ASSESSMENT AND PLAN / ED COURSE  Patient was made aware of his x-ray results. We discussed sling versus shoulder immobilizer. Patient prefers to try the sling at this time. He will call and make an appointment with the orthopedist follow-up. He was given a prescription for Percocet as needed for pain and also instructed to get stool softeners as he's had problems with constipation in the past taking narcotics. He is also to use ice to the area as needed for pain and swelling. ____________________________________________   FINAL CLINICAL IMPRESSION(S) / ED DIAGNOSES  Final diagnoses:  Acromioclavicular joint separation, right, initial encounter  Scalp abrasion, initial encounter     ED Discharge Orders        Ordered    oxyCODONE-acetaminophen (  PERCOCET) 5-325 MG tablet  Every 6 hours PRN     05/02/17 1506       Note:  This document was prepared using Dragon voice recognition software and may include unintentional dictation errors.    Johnn Hai, PA-C 05/02/17 1623    Eula Listen, MD 05/03/17 [phone]

## 2017-05-02 NOTE — ED Triage Notes (Signed)
Pt comes into the ED via POV c/o fall  Where he missed the step from the parking lot onto the sidewalk and the patient fell forwards and fell into a wall/doorframe.  Patient has noticeable deformity to the right shoulder at this time and he is unable to let the arm go.  Patient is in NAD at this time with even and unlabored respirations. Denies LOC but did hit his head on the wall.  Patient has small abrasions present on his head.

## 2017-05-02 NOTE — ED Notes (Signed)
First nurse note  [address] [address]  Injury to right shoulder  Laceration to head and hand  Questionable deformity noted to shoulder

## 2017-05-08 DIAGNOSIS — M1990 Unspecified osteoarthritis, unspecified site: Secondary | ICD-10-CM | POA: Diagnosis not present

## 2017-05-08 DIAGNOSIS — M353 Polymyalgia rheumatica: Secondary | ICD-10-CM | POA: Diagnosis not present

## 2017-05-09 DIAGNOSIS — S43101A Unspecified dislocation of right acromioclavicular joint, initial encounter: Secondary | ICD-10-CM | POA: Diagnosis not present

## 2017-05-09 DIAGNOSIS — E669 Obesity, unspecified: Secondary | ICD-10-CM | POA: Diagnosis not present

## 2017-05-10 DIAGNOSIS — L570 Actinic keratosis: Secondary | ICD-10-CM | POA: Diagnosis not present

## 2017-05-10 DIAGNOSIS — X32XXXA Exposure to sunlight, initial encounter: Secondary | ICD-10-CM | POA: Diagnosis not present

## 2017-05-10 DIAGNOSIS — L0292 Furuncle, unspecified: Secondary | ICD-10-CM | POA: Diagnosis not present

## 2017-05-30 DIAGNOSIS — S43101D Unspecified dislocation of right acromioclavicular joint, subsequent encounter: Secondary | ICD-10-CM | POA: Diagnosis not present

## 2017-05-30 DIAGNOSIS — E669 Obesity, unspecified: Secondary | ICD-10-CM | POA: Diagnosis not present

## 2017-05-31 DIAGNOSIS — J019 Acute sinusitis, unspecified: Secondary | ICD-10-CM | POA: Diagnosis not present

## 2017-05-31 DIAGNOSIS — B9689 Other specified bacterial agents as the cause of diseases classified elsewhere: Secondary | ICD-10-CM | POA: Diagnosis not present

## 2017-06-10 DIAGNOSIS — M1990 Unspecified osteoarthritis, unspecified site: Secondary | ICD-10-CM | POA: Diagnosis not present

## 2017-06-10 DIAGNOSIS — M353 Polymyalgia rheumatica: Secondary | ICD-10-CM | POA: Diagnosis not present

## 2017-06-27 DIAGNOSIS — S43101D Unspecified dislocation of right acromioclavicular joint, subsequent encounter: Secondary | ICD-10-CM | POA: Diagnosis not present

## 2017-06-27 DIAGNOSIS — E669 Obesity, unspecified: Secondary | ICD-10-CM | POA: Diagnosis not present

## 2017-06-27 DIAGNOSIS — M7918 Myalgia, other site: Secondary | ICD-10-CM | POA: Diagnosis not present

## 2017-06-27 DIAGNOSIS — M791 Myalgia, unspecified site: Secondary | ICD-10-CM | POA: Diagnosis not present

## 2017-07-11 DIAGNOSIS — M353 Polymyalgia rheumatica: Secondary | ICD-10-CM | POA: Diagnosis not present

## 2017-07-11 DIAGNOSIS — M1990 Unspecified osteoarthritis, unspecified site: Secondary | ICD-10-CM | POA: Diagnosis not present

## 2017-08-12 DIAGNOSIS — M353 Polymyalgia rheumatica: Secondary | ICD-10-CM | POA: Diagnosis not present

## 2017-08-12 DIAGNOSIS — M1990 Unspecified osteoarthritis, unspecified site: Secondary | ICD-10-CM | POA: Diagnosis not present

## 2017-08-27 DIAGNOSIS — M353 Polymyalgia rheumatica: Secondary | ICD-10-CM | POA: Diagnosis not present

## 2017-08-27 DIAGNOSIS — Z79899 Other long term (current) drug therapy: Secondary | ICD-10-CM | POA: Diagnosis not present

## 2017-08-27 DIAGNOSIS — M1990 Unspecified osteoarthritis, unspecified site: Secondary | ICD-10-CM | POA: Diagnosis not present

## 2017-08-27 DIAGNOSIS — M79642 Pain in left hand: Secondary | ICD-10-CM | POA: Diagnosis not present

## 2017-08-27 DIAGNOSIS — M79641 Pain in right hand: Secondary | ICD-10-CM | POA: Diagnosis not present

## 2017-09-22 ENCOUNTER — Other Ambulatory Visit: Payer: Self-pay | Admitting: Family Medicine

## 2017-09-22 DIAGNOSIS — I10 Essential (primary) hypertension: Secondary | ICD-10-CM

## 2017-09-26 DIAGNOSIS — J069 Acute upper respiratory infection, unspecified: Secondary | ICD-10-CM | POA: Diagnosis not present

## 2017-09-26 DIAGNOSIS — R05 Cough: Secondary | ICD-10-CM | POA: Diagnosis not present

## 2017-10-29 DIAGNOSIS — Z79899 Other long term (current) drug therapy: Secondary | ICD-10-CM | POA: Diagnosis not present

## 2017-10-29 DIAGNOSIS — M1990 Unspecified osteoarthritis, unspecified site: Secondary | ICD-10-CM | POA: Diagnosis not present

## 2017-11-05 DIAGNOSIS — Z79899 Other long term (current) drug therapy: Secondary | ICD-10-CM | POA: Diagnosis not present

## 2017-11-05 DIAGNOSIS — M1990 Unspecified osteoarthritis, unspecified site: Secondary | ICD-10-CM | POA: Diagnosis not present

## 2017-11-05 DIAGNOSIS — M353 Polymyalgia rheumatica: Secondary | ICD-10-CM | POA: Diagnosis not present

## 2017-12-12 ENCOUNTER — Encounter: Payer: Self-pay | Admitting: Family Medicine

## 2017-12-12 ENCOUNTER — Ambulatory Visit (INDEPENDENT_AMBULATORY_CARE_PROVIDER_SITE_OTHER): Payer: PPO | Admitting: Family Medicine

## 2017-12-12 VITALS — BP 114/79 | HR 66 | Ht 69.0 in | Wt 235.0 lb

## 2017-12-12 DIAGNOSIS — Z7189 Other specified counseling: Secondary | ICD-10-CM | POA: Insufficient documentation

## 2017-12-12 DIAGNOSIS — M069 Rheumatoid arthritis, unspecified: Secondary | ICD-10-CM | POA: Insufficient documentation

## 2017-12-12 DIAGNOSIS — M06042 Rheumatoid arthritis without rheumatoid factor, left hand: Secondary | ICD-10-CM | POA: Diagnosis not present

## 2017-12-12 DIAGNOSIS — E7800 Pure hypercholesterolemia, unspecified: Secondary | ICD-10-CM

## 2017-12-12 DIAGNOSIS — Z0000 Encounter for general adult medical examination without abnormal findings: Secondary | ICD-10-CM

## 2017-12-12 DIAGNOSIS — I10 Essential (primary) hypertension: Secondary | ICD-10-CM

## 2017-12-12 DIAGNOSIS — D692 Other nonthrombocytopenic purpura: Secondary | ICD-10-CM | POA: Insufficient documentation

## 2017-12-12 DIAGNOSIS — N400 Enlarged prostate without lower urinary tract symptoms: Secondary | ICD-10-CM | POA: Diagnosis not present

## 2017-12-12 DIAGNOSIS — M06041 Rheumatoid arthritis without rheumatoid factor, right hand: Secondary | ICD-10-CM | POA: Diagnosis not present

## 2017-12-12 LAB — URINALYSIS, ROUTINE W REFLEX MICROSCOPIC
Bilirubin, UA: NEGATIVE
GLUCOSE, UA: NEGATIVE
KETONES UA: NEGATIVE
LEUKOCYTES UA: NEGATIVE
Nitrite, UA: NEGATIVE
PROTEIN UA: NEGATIVE
RBC, UA: NEGATIVE
Specific Gravity, UA: 1.02 (ref 1.005–1.030)
UUROB: 0.2 mg/dL (ref 0.2–1.0)
pH, UA: 5.5 (ref 5.0–7.5)

## 2017-12-12 MED ORDER — LOSARTAN POTASSIUM-HCTZ 100-12.5 MG PO TABS: 1.0000 | ORAL_TABLET | Freq: Every day | ORAL | 4 refills | Status: DC

## 2017-12-12 MED ORDER — ROSUVASTATIN CALCIUM 5 MG PO TABS: 5.0000 mg | ORAL_TABLET | Freq: Every day | ORAL | 4 refills | Status: DC

## 2017-12-12 NOTE — Assessment & Plan Note (Signed)
The current medical regimen is effective;  continue present plan and medications.  

## 2017-12-12 NOTE — Assessment & Plan Note (Signed)
Followed by rheumatology Taking methotrexate and predinsone

## 2017-12-12 NOTE — Assessment & Plan Note (Signed)
A voluntary discussion about advanced care planning including explanation and discussion of advanced directives was extentively discussed with the patient.  Explained about the healthcare proxy and living will was reviewed and packet with forms with expiration of how to fill them out was given.  Time spent: Encounter 16+ min individuals present: Patient 

## 2017-12-12 NOTE — Assessment & Plan Note (Signed)
The current medical regimen is effective;  continue present plan and medications. a 

## 2017-12-12 NOTE — Progress Notes (Signed)
BP 114/79   Pulse 66   Ht 5\' 9"  (1.753 m)   Wt 235 lb (106.6 kg)   SpO2 98%   BMI 34.70 kg/m    Subjective:    Patient ID: Paul Rodriguez, male    DOB: [birth year], 67 y.o.   MRN: [id number]  HPI: Paul Rodriguez is a 67 y.o. male  Chief Complaint  Patient presents with  . Annual Exam   Patient all in all doing well.  Has been working with rheumatology was first felt to have polymyalgia rheumatica but this time is going on patient more consistent with rheumatoid arthritis.  Has been on prednisone and is now on methotrexate low dose.  Is may be doing a little better except for arms are very tender with skin tears and bruising. Blood pressure doing well with no complaints along with BPH and cholesterol. Patient is now retired. Reviewed EKG which is normal sinus and stable.  Relevant past medical, surgical, family and social history reviewed and updated as indicated. Interim medical history since our last visit reviewed. Allergies and medications reviewed and updated.  Review of Systems  Constitutional: Negative.   HENT: Negative.   Eyes: Negative.   Respiratory: Negative.   Cardiovascular: Negative.   Gastrointestinal: Negative.   Endocrine: Negative.   Genitourinary: Negative.   Musculoskeletal: Negative.   Skin: Negative.   Allergic/Immunologic: Negative.   Neurological: Negative.   Hematological: Negative.   Psychiatric/Behavioral: Negative.     Per HPI unless specifically indicated above     Objective:    BP 114/79   Pulse 66   Ht 5\' 9"  (1.753 m)   Wt 235 lb (106.6 kg)   SpO2 98%   BMI 34.70 kg/m   Wt Readings from Last 3 Encounters:  12/12/17 235 lb (106.6 kg)  05/02/17 230 lb (104.3 kg)  02/14/17 225 lb (102.1 kg)    Physical Exam  Constitutional: He is oriented to person, place, and time. He appears well-developed and well-nourished.  HENT:  Head: Normocephalic and atraumatic.  Right Ear: External ear normal.  Left Ear: External ear normal.    Eyes: Pupils are equal, round, and reactive to light. Conjunctivae and EOM are normal.  Neck: Normal range of motion. Neck supple.  Cardiovascular: Normal rate, regular rhythm, normal heart sounds and intact distal pulses.  Pulmonary/Chest: Effort normal and breath sounds normal.  Abdominal: Soft. Bowel sounds are normal. There is no splenomegaly or hepatomegaly.  Genitourinary: Rectum normal, prostate normal and penis normal.  Musculoskeletal: Normal range of motion.  Nodule formation posterior right heel with a little bit on the left heel.  Previous x-rays said these were bony prominence.  Neurological: He is alert and oriented to person, place, and time. He has normal reflexes.  Skin: No rash noted. No erythema.  Psychiatric: He has a normal mood and affect. His behavior is normal. Judgment and thought content normal.    Results for orders placed or performed in visit on 71/24/58  Basic metabolic panel  Result Value Ref Range   Glucose 91 65 - 99 mg/dL   BUN 15 8 - 27 mg/dL   Creatinine, Ser 1.07 0.76 - 1.27 mg/dL   GFR calc non Af Amer 72 >59 mL/min/1.73   GFR calc Af Amer 84 >59 mL/min/1.73   BUN/Creatinine Ratio 14 10 - 24   Sodium 140 134 - 144 mmol/L   Potassium 4.2 3.5 - 5.2 mmol/L   Chloride 100 96 - 106 mmol/L   CO2  24 20 - 29 mmol/L   Calcium 9.4 8.6 - 10.2 mg/dL  LP+ALT+AST Piccolo, Waived  Result Value Ref Range   ALT (SGPT) Piccolo, Waived 25 10 - 47 U/L   AST (SGOT) Piccolo, Waived 17 11 - 38 U/L   Cholesterol Piccolo, Waived 142 <200 mg/dL   HDL Chol Piccolo, Waived 41 (L) >59 mg/dL   Triglycerides Piccolo,Waived 137 <150 mg/dL   Chol/HDL Ratio Piccolo,Waive 3.4 mg/dL   LDL Chol Calc Piccolo Waived 74 <100 mg/dL   VLDL Chol Calc Piccolo,Waive 27 <30 mg/dL      Assessment & Plan:   Problem List Items Addressed This Visit      Cardiovascular and Mediastinum   Essential hypertension    The current medical regimen is effective;  continue present plan and  medications.       Relevant Medications   rosuvastatin (CRESTOR) 5 MG tablet   losartan-hydrochlorothiazide (HYZAAR) 100-12.5 MG tablet   Other Relevant Orders   Comprehensive metabolic panel   CBC with Differential/Platelet   TSH   Urinalysis, Routine w reflex microscopic   Purpura senilis (HCC)    Discussed care and treatment protection vitamins      Relevant Medications   rosuvastatin (CRESTOR) 5 MG tablet   losartan-hydrochlorothiazide (HYZAAR) 100-12.5 MG tablet   Other Relevant Orders   TSH     Musculoskeletal and Integument   Rheumatoid arthritis ([place])    Followed by rheumatology Taking methotrexate and predinsone      Relevant Medications   methotrexate (RHEUMATREX) 2.5 MG tablet   predniSONE (DELTASONE) 5 MG tablet   Other Relevant Orders   Comprehensive metabolic panel   Lipid panel   CBC with Differential/Platelet   TSH   Urinalysis, Routine w reflex microscopic     Genitourinary   BPH (benign prostatic hyperplasia)    The current medical regimen is effective;  continue present plan and medications. a      Relevant Orders   TSH   PSA     Other   Hyperlipidemia    The current medical regimen is effective;  continue present plan and medications.       Relevant Medications   rosuvastatin (CRESTOR) 5 MG tablet   losartan-hydrochlorothiazide (HYZAAR) 100-12.5 MG tablet   Other Relevant Orders   Lipid panel   TSH   Advanced care planning/counseling discussion    A voluntary discussion about advanced care planning including explanation and discussion of advanced directives was extentively discussed with the patient.  Explained about the healthcare proxy and living will was reviewed and packet with forms with expiration of how to fill them out was given.  Time spent: Encounter 16+ min individuals present: Patient       Other Visit Diagnoses    Welcome to Medicare preventive visit    -  Primary   Relevant Orders   EKG 12-Lead (Completed)        Follow up plan: Return in about 6 months (around 06/14/2018).

## 2017-12-12 NOTE — Assessment & Plan Note (Signed)
Discussed care and treatment protection vitamins

## 2017-12-13 LAB — CBC WITH DIFFERENTIAL/PLATELET
Basophils Absolute: 0 10*3/uL (ref 0.0–0.2)
Basos: 1 %
EOS (ABSOLUTE): 0.1 10*3/uL (ref 0.0–0.4)
EOS: 2 %
HEMATOCRIT: 46 % (ref 37.5–51.0)
HEMOGLOBIN: 15.6 g/dL (ref 13.0–17.7)
IMMATURE GRANULOCYTES: 0 %
Immature Grans (Abs): 0 10*3/uL (ref 0.0–0.1)
LYMPHS ABS: 1.6 10*3/uL (ref 0.7–3.1)
Lymphs: 25 %
MCH: 32 pg (ref 26.6–33.0)
MCHC: 33.9 g/dL (ref 31.5–35.7)
MCV: 94 fL (ref 79–97)
MONOCYTES: 6 %
Monocytes Absolute: 0.4 10*3/uL (ref 0.1–0.9)
NEUTROS PCT: 66 %
Neutrophils Absolute: 4.2 10*3/uL (ref 1.4–7.0)
Platelets: 229 10*3/uL (ref 150–450)
RBC: 4.88 x10E6/uL (ref 4.14–5.80)
RDW: 15.2 % (ref 12.3–15.4)
WBC: 6.3 10*3/uL (ref 3.4–10.8)

## 2017-12-13 LAB — COMPREHENSIVE METABOLIC PANEL
A/G RATIO: 2.1 (ref 1.2–2.2)
ALT: 18 IU/L (ref 0–44)
AST: 23 IU/L (ref 0–40)
Albumin: 4.6 g/dL (ref 3.6–4.8)
Alkaline Phosphatase: 70 IU/L (ref 39–117)
BILIRUBIN TOTAL: 1 mg/dL (ref 0.0–1.2)
BUN/Creatinine Ratio: 16 (ref 10–24)
BUN: 20 mg/dL (ref 8–27)
CHLORIDE: 101 mmol/L (ref 96–106)
CO2: 24 mmol/L (ref 20–29)
Calcium: 9.6 mg/dL (ref 8.6–10.2)
Creatinine, Ser: 1.29 mg/dL — ABNORMAL HIGH (ref 0.76–1.27)
GFR calc non Af Amer: 58 mL/min/{1.73_m2} — ABNORMAL LOW (ref >59)
GFR, EST AFRICAN AMERICAN: 67 mL/min/{1.73_m2} (ref >59)
Globulin, Total: 2.2 g/dL (ref 1.5–4.5)
Glucose: 91 mg/dL (ref 65–99)
POTASSIUM: 4.3 mmol/L (ref 3.5–5.2)
Sodium: 140 mmol/L (ref 134–144)
TOTAL PROTEIN: 6.8 g/dL (ref 6.0–8.5)

## 2017-12-13 LAB — TSH: TSH: 2.93 u[IU]/mL (ref 0.450–4.500)

## 2017-12-13 LAB — LIPID PANEL
CHOLESTEROL TOTAL: 146 mg/dL (ref 100–199)
Chol/HDL Ratio: 3.9 ratio (ref 0.0–5.0)
HDL: 37 mg/dL — AB (ref >39)
LDL Calculated: 82 mg/dL (ref 0–99)
Triglycerides: 136 mg/dL (ref 0–149)
VLDL Cholesterol Cal: 27 mg/dL (ref 5–40)

## 2017-12-13 LAB — PSA: PROSTATE SPECIFIC AG, SERUM: 0.9 ng/mL (ref 0.0–4.0)

## 2018-01-13 ENCOUNTER — Ambulatory Visit (INDEPENDENT_AMBULATORY_CARE_PROVIDER_SITE_OTHER): Payer: PPO | Admitting: Family Medicine

## 2018-01-13 ENCOUNTER — Encounter: Payer: Self-pay | Admitting: Family Medicine

## 2018-01-13 VITALS — BP 121/79 | HR 66 | Temp 98.3°F | Ht 69.0 in | Wt 234.7 lb

## 2018-01-13 DIAGNOSIS — N529 Male erectile dysfunction, unspecified: Secondary | ICD-10-CM | POA: Diagnosis not present

## 2018-01-13 DIAGNOSIS — N400 Enlarged prostate without lower urinary tract symptoms: Secondary | ICD-10-CM

## 2018-01-13 DIAGNOSIS — R350 Frequency of micturition: Secondary | ICD-10-CM | POA: Diagnosis not present

## 2018-01-13 DIAGNOSIS — R351 Nocturia: Secondary | ICD-10-CM | POA: Diagnosis not present

## 2018-01-13 LAB — UA/M W/RFLX CULTURE, ROUTINE
BILIRUBIN UA: NEGATIVE
Glucose, UA: NEGATIVE
Ketones, UA: NEGATIVE
Leukocytes, UA: NEGATIVE
Nitrite, UA: NEGATIVE
PH UA: 5.5 (ref 5.0–7.5)
PROTEIN UA: NEGATIVE
RBC, UA: NEGATIVE
Specific Gravity, UA: 1.02 (ref 1.005–1.030)
Urobilinogen, Ur: 0.2 mg/dL (ref 0.2–1.0)

## 2018-01-13 MED ORDER — TADALAFIL 5 MG PO TABS
5.0000 mg | ORAL_TABLET | Freq: Every day | ORAL | 0 refills | Status: DC | PRN
Start: 2018-01-13 — End: 2021-02-22

## 2018-01-13 NOTE — Progress Notes (Signed)
BP 121/79   Pulse 66   Temp 98.3 F (36.8 C) (Oral)   Ht 5\' 9"  (1.753 m)   Wt 234 lb 11.2 oz (106.5 kg)   SpO2 97%   BMI 34.66 kg/m    Subjective:    Patient ID: Paul Rodriguez, male    DOB: Sep 30, 1950, 67 y.o.   MRN: [id number]  HPI: Paul Rodriguez is a 67 y.o. male  Chief Complaint  Patient presents with  . Urinary Tract Infection    pt states he has been having frequent urination, states he thinks he has prostatitis   Here today for worsening nocturia. Known hx of BPH and recurrent prostatitis. Followed by Urology every so often for prostatitis (every 2-3 years). Does not feel like he's having that currently because usually they come with stabbing pain near rectum and occasional fevers. Having to get up 3 times at night. Denies fevers, abdominal pain, dysuria, hematuria. Also having some erectile issues that have worsened steadily. Very interested in starting cialis.   Relevant past medical, surgical, family and social history reviewed and updated as indicated. Interim medical history since our last visit reviewed. Allergies and medications reviewed and updated.  Review of Systems  Per HPI unless specifically indicated above     Objective:    BP 121/79   Pulse 66   Temp 98.3 F (36.8 C) (Oral)   Ht 5\' 9"  (1.753 m)   Wt 234 lb 11.2 oz (106.5 kg)   SpO2 97%   BMI 34.66 kg/m   Wt Readings from Last 3 Encounters:  01/13/18 234 lb 11.2 oz (106.5 kg)  12/12/17 235 lb (106.6 kg)  05/02/17 230 lb (104.3 kg)    Physical Exam  Constitutional: He is oriented to person, place, and time. He appears well-developed and well-nourished. No distress.  HENT:  Head: Atraumatic.  Eyes: Conjunctivae and EOM are normal.  Neck: Normal range of motion. Neck supple.  Cardiovascular: Normal rate, regular rhythm and normal heart sounds.  Pulmonary/Chest: Effort normal and breath sounds normal.  Genitourinary:  Genitourinary Comments: Prostate exam declined  Musculoskeletal:  Normal range of motion. He exhibits no tenderness (No CVA ttp b/l).  Neurological: He is alert and oriented to person, place, and time.  Skin: Skin is warm and dry.  Psychiatric: He has a normal mood and affect. His behavior is normal.  Nursing note and vitals reviewed.   Results for orders placed or performed in visit on 01/13/18  UA/M w/rflx Culture, Routine  Result Value Ref Range   Specific Gravity, UA 1.020 1.005 - 1.030   pH, UA 5.5 5.0 - 7.5   Color, UA Yellow Yellow   Appearance Ur Clear Clear   Leukocytes, UA Negative Negative   Protein, UA Negative Negative/Trace   Glucose, UA Negative Negative   Ketones, UA Negative Negative   RBC, UA Negative Negative   Bilirubin, UA Negative Negative   Urobilinogen, Ur 0.2 0.2 - 1.0 mg/dL   Nitrite, UA Negative Negative      Assessment & Plan:   Problem List Items Addressed This Visit      Genitourinary   BPH (benign prostatic hyperplasia)   ED (erectile dysfunction)    Other Visit Diagnoses    Nocturia    -  Primary   Relevant Orders   UA/M w/rflx Culture, Routine (Completed)    U/A neg for infection, and sxs not consistent with acute infection. Suspect just progressive BPH causing sxs. Will start cialis to treat both  BPH and erectile dysfunction. Risks and benefits reviewed. F/u if no improvement   Follow up plan: Return for as scheduled.

## 2018-01-15 DIAGNOSIS — N529 Male erectile dysfunction, unspecified: Secondary | ICD-10-CM | POA: Insufficient documentation

## 2018-01-15 NOTE — Patient Instructions (Signed)
Follow up as scheduled.  

## 2018-01-21 DIAGNOSIS — M9901 Segmental and somatic dysfunction of cervical region: Secondary | ICD-10-CM | POA: Diagnosis not present

## 2018-01-21 DIAGNOSIS — M9902 Segmental and somatic dysfunction of thoracic region: Secondary | ICD-10-CM | POA: Diagnosis not present

## 2018-01-21 DIAGNOSIS — M62830 Muscle spasm of back: Secondary | ICD-10-CM | POA: Diagnosis not present

## 2018-01-21 DIAGNOSIS — M5134 Other intervertebral disc degeneration, thoracic region: Secondary | ICD-10-CM | POA: Diagnosis not present

## 2018-01-22 DIAGNOSIS — M9901 Segmental and somatic dysfunction of cervical region: Secondary | ICD-10-CM | POA: Diagnosis not present

## 2018-01-22 DIAGNOSIS — M62830 Muscle spasm of back: Secondary | ICD-10-CM | POA: Diagnosis not present

## 2018-01-22 DIAGNOSIS — M5134 Other intervertebral disc degeneration, thoracic region: Secondary | ICD-10-CM | POA: Diagnosis not present

## 2018-01-22 DIAGNOSIS — M9902 Segmental and somatic dysfunction of thoracic region: Secondary | ICD-10-CM | POA: Diagnosis not present

## 2018-01-24 DIAGNOSIS — M9902 Segmental and somatic dysfunction of thoracic region: Secondary | ICD-10-CM | POA: Diagnosis not present

## 2018-01-24 DIAGNOSIS — M62830 Muscle spasm of back: Secondary | ICD-10-CM | POA: Diagnosis not present

## 2018-01-24 DIAGNOSIS — M9901 Segmental and somatic dysfunction of cervical region: Secondary | ICD-10-CM | POA: Diagnosis not present

## 2018-01-24 DIAGNOSIS — M5134 Other intervertebral disc degeneration, thoracic region: Secondary | ICD-10-CM | POA: Diagnosis not present

## 2018-01-26 DIAGNOSIS — M79672 Pain in left foot: Secondary | ICD-10-CM | POA: Diagnosis not present

## 2018-01-26 DIAGNOSIS — S99922A Unspecified injury of left foot, initial encounter: Secondary | ICD-10-CM | POA: Diagnosis not present

## 2018-01-26 DIAGNOSIS — M722 Plantar fascial fibromatosis: Secondary | ICD-10-CM | POA: Diagnosis not present

## 2018-01-27 DIAGNOSIS — M66872 Spontaneous rupture of other tendons, left ankle and foot: Secondary | ICD-10-CM | POA: Diagnosis not present

## 2018-02-10 DIAGNOSIS — S93312A Subluxation of tarsal joint of left foot, initial encounter: Secondary | ICD-10-CM | POA: Diagnosis not present

## 2018-02-10 DIAGNOSIS — M6528 Calcific tendinitis, other site: Secondary | ICD-10-CM | POA: Diagnosis not present

## 2018-02-12 DIAGNOSIS — Z79899 Other long term (current) drug therapy: Secondary | ICD-10-CM | POA: Diagnosis not present

## 2018-02-12 DIAGNOSIS — M1990 Unspecified osteoarthritis, unspecified site: Secondary | ICD-10-CM | POA: Diagnosis not present

## 2018-02-19 DIAGNOSIS — Z79899 Other long term (current) drug therapy: Secondary | ICD-10-CM | POA: Diagnosis not present

## 2018-02-19 DIAGNOSIS — M1990 Unspecified osteoarthritis, unspecified site: Secondary | ICD-10-CM | POA: Diagnosis not present

## 2018-02-26 ENCOUNTER — Ambulatory Visit (INDEPENDENT_AMBULATORY_CARE_PROVIDER_SITE_OTHER): Payer: PPO

## 2018-02-26 DIAGNOSIS — Z23 Encounter for immunization: Secondary | ICD-10-CM | POA: Diagnosis not present

## 2018-03-11 ENCOUNTER — Other Ambulatory Visit: Payer: Self-pay | Admitting: Family Medicine

## 2018-03-11 DIAGNOSIS — I10 Essential (primary) hypertension: Secondary | ICD-10-CM

## 2018-03-11 NOTE — Telephone Encounter (Signed)
Requested Prescriptions  Pending Prescriptions Disp Refills  . losartan (COZAAR) 100 MG tablet [Pharmacy Med Name: LOSARTAN POTASSIUM 100 MG TAB] 90 tablet 1    Sig: Please specify directions, refills and quantity     Cardiovascular:  Angiotensin Receptor Blockers Failed - 03/11/2018 11:29 AM      Failed - Cr in normal range and within 180 days    Creatinine, Ser  Date Value Ref Range Status  12/12/2017 1.29 (H) 0.76 - 1.27 mg/dL Final         Passed - K in normal range and within 180 days    Potassium  Date Value Ref Range Status  12/12/2017 4.3 3.5 - 5.2 mmol/L Final         Passed - Patient is not pregnant      Passed - Last BP in normal range    BP Readings from Last 1 Encounters:  01/13/18 121/79         Passed - Valid encounter within last 6 months    Recent Outpatient Visits          1 month ago Nocturia   [facility] [employer], [place]   2 months ago [facility] to [employer] preventive visit   [facility] [place], Jeannette How, MD   1 year ago Essential hypertension   [facility] Guadalupe Maple, MD   1 year ago Myalgia   [facility] [employer], [place]   1 year ago [place], [place], [place]      Future Appointments            In 3 months Crissman, Jeannette How, MD [facility]

## 2018-04-07 DIAGNOSIS — M1990 Unspecified osteoarthritis, unspecified site: Secondary | ICD-10-CM | POA: Diagnosis not present

## 2018-04-07 DIAGNOSIS — Z79899 Other long term (current) drug therapy: Secondary | ICD-10-CM | POA: Diagnosis not present

## 2018-04-10 DIAGNOSIS — M1990 Unspecified osteoarthritis, unspecified site: Secondary | ICD-10-CM | POA: Diagnosis not present

## 2018-04-10 DIAGNOSIS — M79604 Pain in right leg: Secondary | ICD-10-CM | POA: Diagnosis not present

## 2018-04-10 DIAGNOSIS — M791 Myalgia, unspecified site: Secondary | ICD-10-CM | POA: Diagnosis not present

## 2018-04-10 DIAGNOSIS — M79605 Pain in left leg: Secondary | ICD-10-CM | POA: Diagnosis not present

## 2018-04-15 DIAGNOSIS — M5134 Other intervertebral disc degeneration, thoracic region: Secondary | ICD-10-CM | POA: Diagnosis not present

## 2018-04-15 DIAGNOSIS — M9901 Segmental and somatic dysfunction of cervical region: Secondary | ICD-10-CM | POA: Diagnosis not present

## 2018-04-15 DIAGNOSIS — M62830 Muscle spasm of back: Secondary | ICD-10-CM | POA: Diagnosis not present

## 2018-04-15 DIAGNOSIS — M9902 Segmental and somatic dysfunction of thoracic region: Secondary | ICD-10-CM | POA: Diagnosis not present

## 2018-04-24 DIAGNOSIS — M5134 Other intervertebral disc degeneration, thoracic region: Secondary | ICD-10-CM | POA: Diagnosis not present

## 2018-04-24 DIAGNOSIS — M9901 Segmental and somatic dysfunction of cervical region: Secondary | ICD-10-CM | POA: Diagnosis not present

## 2018-04-24 DIAGNOSIS — M9902 Segmental and somatic dysfunction of thoracic region: Secondary | ICD-10-CM | POA: Diagnosis not present

## 2018-04-24 DIAGNOSIS — M62830 Muscle spasm of back: Secondary | ICD-10-CM | POA: Diagnosis not present

## 2018-04-28 DIAGNOSIS — M62830 Muscle spasm of back: Secondary | ICD-10-CM | POA: Diagnosis not present

## 2018-04-28 DIAGNOSIS — M5134 Other intervertebral disc degeneration, thoracic region: Secondary | ICD-10-CM | POA: Diagnosis not present

## 2018-04-28 DIAGNOSIS — M9902 Segmental and somatic dysfunction of thoracic region: Secondary | ICD-10-CM | POA: Diagnosis not present

## 2018-04-28 DIAGNOSIS — M9901 Segmental and somatic dysfunction of cervical region: Secondary | ICD-10-CM | POA: Diagnosis not present

## 2018-04-29 DIAGNOSIS — M79605 Pain in left leg: Secondary | ICD-10-CM | POA: Diagnosis not present

## 2018-04-29 DIAGNOSIS — M79604 Pain in right leg: Secondary | ICD-10-CM | POA: Diagnosis not present

## 2018-04-29 DIAGNOSIS — R29898 Other symptoms and signs involving the musculoskeletal system: Secondary | ICD-10-CM | POA: Diagnosis not present

## 2018-04-30 DIAGNOSIS — M9902 Segmental and somatic dysfunction of thoracic region: Secondary | ICD-10-CM | POA: Diagnosis not present

## 2018-04-30 DIAGNOSIS — M5134 Other intervertebral disc degeneration, thoracic region: Secondary | ICD-10-CM | POA: Diagnosis not present

## 2018-04-30 DIAGNOSIS — M9901 Segmental and somatic dysfunction of cervical region: Secondary | ICD-10-CM | POA: Diagnosis not present

## 2018-04-30 DIAGNOSIS — M62830 Muscle spasm of back: Secondary | ICD-10-CM | POA: Diagnosis not present

## 2018-05-01 DIAGNOSIS — E669 Obesity, unspecified: Secondary | ICD-10-CM | POA: Diagnosis not present

## 2018-05-01 DIAGNOSIS — M7551 Bursitis of right shoulder: Secondary | ICD-10-CM | POA: Diagnosis not present

## 2018-05-05 DIAGNOSIS — M5134 Other intervertebral disc degeneration, thoracic region: Secondary | ICD-10-CM | POA: Diagnosis not present

## 2018-05-05 DIAGNOSIS — M62830 Muscle spasm of back: Secondary | ICD-10-CM | POA: Diagnosis not present

## 2018-05-05 DIAGNOSIS — M9902 Segmental and somatic dysfunction of thoracic region: Secondary | ICD-10-CM | POA: Diagnosis not present

## 2018-05-05 DIAGNOSIS — M9901 Segmental and somatic dysfunction of cervical region: Secondary | ICD-10-CM | POA: Diagnosis not present

## 2018-05-08 DIAGNOSIS — M9901 Segmental and somatic dysfunction of cervical region: Secondary | ICD-10-CM | POA: Diagnosis not present

## 2018-05-08 DIAGNOSIS — M62830 Muscle spasm of back: Secondary | ICD-10-CM | POA: Diagnosis not present

## 2018-05-08 DIAGNOSIS — M9902 Segmental and somatic dysfunction of thoracic region: Secondary | ICD-10-CM | POA: Diagnosis not present

## 2018-05-08 DIAGNOSIS — M5134 Other intervertebral disc degeneration, thoracic region: Secondary | ICD-10-CM | POA: Diagnosis not present

## 2018-05-09 DIAGNOSIS — L570 Actinic keratosis: Secondary | ICD-10-CM | POA: Diagnosis not present

## 2018-05-22 DIAGNOSIS — M1990 Unspecified osteoarthritis, unspecified site: Secondary | ICD-10-CM | POA: Diagnosis not present

## 2018-05-22 DIAGNOSIS — Z79899 Other long term (current) drug therapy: Secondary | ICD-10-CM | POA: Diagnosis not present

## 2018-06-17 ENCOUNTER — Encounter: Payer: Self-pay | Admitting: Family Medicine

## 2018-06-17 ENCOUNTER — Ambulatory Visit (INDEPENDENT_AMBULATORY_CARE_PROVIDER_SITE_OTHER): Payer: Medicare Other | Admitting: Family Medicine

## 2018-06-17 VITALS — BP 126/86 | HR 82 | Temp 98.4°F | Wt 234.0 lb

## 2018-06-17 DIAGNOSIS — I10 Essential (primary) hypertension: Secondary | ICD-10-CM | POA: Diagnosis not present

## 2018-06-17 DIAGNOSIS — Z1211 Encounter for screening for malignant neoplasm of colon: Secondary | ICD-10-CM

## 2018-06-17 DIAGNOSIS — M06041 Rheumatoid arthritis without rheumatoid factor, right hand: Secondary | ICD-10-CM

## 2018-06-17 DIAGNOSIS — E7800 Pure hypercholesterolemia, unspecified: Secondary | ICD-10-CM | POA: Diagnosis not present

## 2018-06-17 DIAGNOSIS — M06042 Rheumatoid arthritis without rheumatoid factor, left hand: Secondary | ICD-10-CM

## 2018-06-17 LAB — LP+ALT+AST PICCOLO, WAIVED
ALT (SGPT) Piccolo, Waived: 30 U/L (ref 10–47)
AST (SGOT) Piccolo, Waived: 29 U/L (ref 11–38)
Chol/HDL Ratio Piccolo,Waive: 3.5 mg/dL
Cholesterol Piccolo, Waived: 149 mg/dL (ref <200)
HDL CHOL PICCOLO, WAIVED: 43 mg/dL — AB (ref >59)
LDL Chol Calc Piccolo Waived: 67 mg/dL (ref <100)
Triglycerides Piccolo,Waived: 193 mg/dL — ABNORMAL HIGH (ref <150)
VLDL Chol Calc Piccolo,Waive: 39 mg/dL — ABNORMAL HIGH (ref <30)

## 2018-06-17 MED ORDER — LOSARTAN POTASSIUM 100 MG PO TABS: 100.0000 mg | ORAL_TABLET | Freq: Every day | ORAL | 2 refills | Status: DC

## 2018-06-17 NOTE — Assessment & Plan Note (Signed)
Stable followed by rheumatology and taking methotrexate

## 2018-06-17 NOTE — Progress Notes (Signed)
BP 126/86   Pulse 82   Temp 98.4 F (36.9 C) (Oral)   Wt 234 lb (106.1 kg)   SpO2 97%   BMI 34.56 kg/m    Subjective:    Patient ID: Paul Rodriguez, male    DOB: January 12, 1951, 68 y.o.   MRN: [id number]  HPI: Paul Rodriguez is a 68 y.o. male  Chief Complaint  Patient presents with  . Hyperlipidemia  . Hypertension  Patient all in all doing well no complaints cholesterol doing well without problems or issues. Blood pressure was taking losartan HCT now just losartan with blood pressure good control and patient not missing HCT. Rheumatoid arthritis stable taking methotrexate once a week with good control and no further pain issues.   Relevant past medical, surgical, family and social history reviewed and updated as indicated. Interim medical history since our last visit reviewed. Allergies and medications reviewed and updated.  Review of Systems  Constitutional: Negative.   Respiratory: Negative.   Cardiovascular: Negative.     Per HPI unless specifically indicated above     Objective:    BP 126/86   Pulse 82   Temp 98.4 F (36.9 C) (Oral)   Wt 234 lb (106.1 kg)   SpO2 97%   BMI 34.56 kg/m   Wt Readings from Last 3 Encounters:  06/17/18 234 lb (106.1 kg)  01/13/18 234 lb 11.2 oz (106.5 kg)  12/12/17 235 lb (106.6 kg)    Physical Exam Constitutional:      Appearance: He is well-developed.  HENT:     Head: Normocephalic and atraumatic.  Eyes:     Conjunctiva/sclera: Conjunctivae normal.  Neck:     Musculoskeletal: Normal range of motion.  Cardiovascular:     Rate and Rhythm: Normal rate and regular rhythm.     Heart sounds: Normal heart sounds.  Pulmonary:     Effort: Pulmonary effort is normal.     Breath sounds: Normal breath sounds.  Musculoskeletal: Normal range of motion.  Skin:    Findings: No erythema.  Neurological:     Mental Status: He is alert and oriented to person, place, and time.  Psychiatric:        Behavior: Behavior normal.        Thought Content: Thought content normal.        Judgment: Judgment normal.     Results for orders placed or performed in visit on 01/13/18  UA/M w/rflx Culture, Routine  Result Value Ref Range   Specific Gravity, UA 1.020 1.005 - 1.030   pH, UA 5.5 5.0 - 7.5   Color, UA Yellow Yellow   Appearance Ur Clear Clear   Leukocytes, UA Negative Negative   Protein, UA Negative Negative/Trace   Glucose, UA Negative Negative   Ketones, UA Negative Negative   RBC, UA Negative Negative   Bilirubin, UA Negative Negative   Urobilinogen, Ur 0.2 0.2 - 1.0 mg/dL   Nitrite, UA Negative Negative      Assessment & Plan:   Problem List Items Addressed This Visit      Cardiovascular and Mediastinum   Essential hypertension - Primary    The current medical regimen is effective;  continue present plan and medications.       Relevant Medications   losartan (COZAAR) 100 MG tablet   Other Relevant Orders   Basic metabolic panel     Musculoskeletal and Integument   Rheumatoid arthritis ([place])    Stable followed by rheumatology and taking methotrexate  Other   Hyperlipidemia    The current medical regimen is effective;  continue present plan and medications.       Relevant Medications   losartan (COZAAR) 100 MG tablet   Other Relevant Orders   LP+ALT+AST Piccolo, Waived    Other Visit Diagnoses    Encounter for screening colonoscopy       Relevant Orders   Ambulatory referral to Gastroenterology       Follow up plan: Return in about 6 months (around 12/16/2018) for Physical Exam.

## 2018-06-17 NOTE — Assessment & Plan Note (Signed)
The current medical regimen is effective;  continue present plan and medications.  

## 2018-06-18 ENCOUNTER — Encounter: Payer: Self-pay | Admitting: Family Medicine

## 2018-06-18 LAB — BASIC METABOLIC PANEL
BUN/Creatinine Ratio: 14 (ref 10–24)
BUN: 16 mg/dL (ref 8–27)
CHLORIDE: 104 mmol/L (ref 96–106)
CO2: 23 mmol/L (ref 20–29)
Calcium: 9.5 mg/dL (ref 8.6–10.2)
Creatinine, Ser: 1.16 mg/dL (ref 0.76–1.27)
GFR calc Af Amer: 75 mL/min/{1.73_m2} (ref >59)
GFR calc non Af Amer: 65 mL/min/{1.73_m2} (ref >59)
GLUCOSE: 113 mg/dL — AB (ref 65–99)
POTASSIUM: 4.1 mmol/L (ref 3.5–5.2)
SODIUM: 142 mmol/L (ref 134–144)

## 2018-06-19 ENCOUNTER — Telehealth: Payer: Self-pay | Admitting: Gastroenterology

## 2018-06-19 ENCOUNTER — Other Ambulatory Visit: Payer: Self-pay

## 2018-06-19 DIAGNOSIS — Z1211 Encounter for screening for malignant neoplasm of colon: Secondary | ICD-10-CM

## 2018-06-19 NOTE — Telephone Encounter (Signed)
Returned call.  Pt has been scheduled colonoscopy 07/23/18 at [facility] with Dr. Marius Ditch.  Thanks [employer]

## 2018-06-19 NOTE — Telephone Encounter (Signed)
Patient called to return Michelle's call to schedule a colonoscopy.

## 2018-06-20 DIAGNOSIS — M9902 Segmental and somatic dysfunction of thoracic region: Secondary | ICD-10-CM | POA: Diagnosis not present

## 2018-06-20 DIAGNOSIS — M62830 Muscle spasm of back: Secondary | ICD-10-CM | POA: Diagnosis not present

## 2018-06-20 DIAGNOSIS — M5134 Other intervertebral disc degeneration, thoracic region: Secondary | ICD-10-CM | POA: Diagnosis not present

## 2018-06-20 DIAGNOSIS — M9901 Segmental and somatic dysfunction of cervical region: Secondary | ICD-10-CM | POA: Diagnosis not present

## 2018-06-24 DIAGNOSIS — M1990 Unspecified osteoarthritis, unspecified site: Secondary | ICD-10-CM | POA: Diagnosis not present

## 2018-06-24 DIAGNOSIS — Z79899 Other long term (current) drug therapy: Secondary | ICD-10-CM | POA: Diagnosis not present

## 2018-07-14 ENCOUNTER — Other Ambulatory Visit: Payer: Self-pay

## 2018-07-14 ENCOUNTER — Encounter: Payer: Self-pay | Admitting: *Deleted

## 2018-07-21 DIAGNOSIS — M9901 Segmental and somatic dysfunction of cervical region: Secondary | ICD-10-CM | POA: Diagnosis not present

## 2018-07-21 DIAGNOSIS — M62830 Muscle spasm of back: Secondary | ICD-10-CM | POA: Diagnosis not present

## 2018-07-21 DIAGNOSIS — M9902 Segmental and somatic dysfunction of thoracic region: Secondary | ICD-10-CM | POA: Diagnosis not present

## 2018-07-21 DIAGNOSIS — M5134 Other intervertebral disc degeneration, thoracic region: Secondary | ICD-10-CM | POA: Diagnosis not present

## 2018-07-22 NOTE — Discharge Instructions (Signed)
General Anesthesia, Adult, Care After  This sheet gives you information about how to care for yourself after your procedure. Your health care provider may also give you more specific instructions. If you have problems or questions, contact your health care provider.  What can I expect after the procedure?  After the procedure, the following side effects are common:  Pain or discomfort at the IV site.  Nausea.  Vomiting.  Sore throat.  Trouble concentrating.  Feeling cold or chills.  Weak or tired.  Sleepiness and fatigue.  Soreness and body aches. These side effects can affect parts of the body that were not involved in surgery.  Follow these instructions at home:    For at least 24 hours after the procedure:  Have a responsible adult stay with you. It is important to have someone help care for you until you are awake and alert.  Rest as needed.  Do not:  Participate in activities in which you could fall or become injured.  Drive.  Use heavy machinery.  Drink alcohol.  Take sleeping pills or medicines that cause drowsiness.  Make important decisions or sign legal documents.  Take care of children on your own.  Eating and drinking  Follow any instructions from your health care provider about eating or drinking restrictions.  When you feel hungry, start by eating small amounts of foods that are soft and easy to digest (bland), such as toast. Gradually return to your regular diet.  Drink enough fluid to keep your urine pale yellow.  If you vomit, rehydrate by drinking water, juice, or clear broth.  General instructions  If you have sleep apnea, surgery and certain medicines can increase your risk for breathing problems. Follow instructions from your health care provider about wearing your sleep device:  Anytime you are sleeping, including during daytime naps.  While taking prescription pain medicines, sleeping medicines, or medicines that make you drowsy.  Return to your normal activities as told by your health care  provider. Ask your health care provider what activities are safe for you.  Take over-the-counter and prescription medicines only as told by your health care provider.  If you smoke, do not smoke without supervision.  Keep all follow-up visits as told by your health care provider. This is important.  Contact a health care provider if:  You have nausea or vomiting that does not get better with medicine.  You cannot eat or drink without vomiting.  You have pain that does not get better with medicine.  You are unable to pass urine.  You develop a skin rash.  You have a fever.  You have redness around your IV site that gets worse.  Get help right away if:  You have difficulty breathing.  You have chest pain.  You have blood in your urine or stool, or you vomit blood.  Summary  After the procedure, it is common to have a sore throat or nausea. It is also common to feel tired.  Have a responsible adult stay with you for the first 24 hours after general anesthesia. It is important to have someone help care for you until you are awake and alert.  When you feel hungry, start by eating small amounts of foods that are soft and easy to digest (bland), such as toast. Gradually return to your regular diet.  Drink enough fluid to keep your urine pale yellow.  Return to your normal activities as told by your health care provider. Ask your health care   provider what activities are safe for you.  This information is not intended to replace advice given to you by your health care provider. Make sure you discuss any questions you have with your health care provider.  Document Released: 08/13/2000 Document Revised: 12/21/2016 Document Reviewed: 12/21/2016  Elsevier Interactive Patient Education  2019 [employer]

## 2018-07-23 ENCOUNTER — Ambulatory Visit
Admission: RE | Admit: 2018-07-23 | Discharge: 2018-07-23 | Disposition: A | Discharge status: Home / Self Care | Payer: Medicare Other | Attending: Gastroenterology | Admitting: Gastroenterology

## 2018-07-23 ENCOUNTER — Encounter
Admission: RE | Disposition: A | Discharge status: Home / Self Care | Payer: Self-pay | Source: Home / Self Care | Attending: Gastroenterology

## 2018-07-23 ENCOUNTER — Ambulatory Visit: Payer: Medicare Other | Admitting: Anesthesiology

## 2018-07-23 DIAGNOSIS — Z1211 Encounter for screening for malignant neoplasm of colon: Secondary | ICD-10-CM

## 2018-07-23 DIAGNOSIS — Z87891 Personal history of nicotine dependence: Secondary | ICD-10-CM | POA: Diagnosis not present

## 2018-07-23 DIAGNOSIS — Z7982 Long term (current) use of aspirin: Secondary | ICD-10-CM | POA: Diagnosis not present

## 2018-07-23 DIAGNOSIS — I10 Essential (primary) hypertension: Secondary | ICD-10-CM | POA: Diagnosis not present

## 2018-07-23 DIAGNOSIS — Z79899 Other long term (current) drug therapy: Secondary | ICD-10-CM | POA: Insufficient documentation

## 2018-07-23 DIAGNOSIS — D123 Benign neoplasm of transverse colon: Secondary | ICD-10-CM | POA: Diagnosis not present

## 2018-07-23 DIAGNOSIS — M069 Rheumatoid arthritis, unspecified: Secondary | ICD-10-CM | POA: Diagnosis not present

## 2018-07-23 DIAGNOSIS — K5730 Diverticulosis of large intestine without perforation or abscess without bleeding: Secondary | ICD-10-CM | POA: Diagnosis not present

## 2018-07-23 DIAGNOSIS — E785 Hyperlipidemia, unspecified: Secondary | ICD-10-CM | POA: Insufficient documentation

## 2018-07-23 HISTORY — PX: COLONOSCOPY WITH PROPOFOL: SHX5780

## 2018-07-23 HISTORY — PX: POLYPECTOMY: SHX5525

## 2018-07-23 SURGERY — COLONOSCOPY WITH PROPOFOL
Anesthesia: General | Site: Rectum

## 2018-07-23 MED ORDER — SODIUM CHLORIDE 0.9 % IV SOLN: INTRAVENOUS | Status: DC

## 2018-07-23 MED ORDER — PROPOFOL 10 MG/ML IV BOLUS
INTRAVENOUS | Status: DC | PRN
  Administered 2018-07-23: 30 mg via INTRAVENOUS
  Administered 2018-07-23: 20 mg via INTRAVENOUS
  Administered 2018-07-23: 50 mg via INTRAVENOUS
  Administered 2018-07-23 (×2): 30 mg via INTRAVENOUS
  Administered 2018-07-23: 100 mg via INTRAVENOUS
  Administered 2018-07-23 (×2): 20 mg via INTRAVENOUS
  Administered 2018-07-23: 30 mg via INTRAVENOUS
  Administered 2018-07-23: 20 mg via INTRAVENOUS

## 2018-07-23 MED ORDER — LIDOCAINE HCL (CARDIAC) PF 100 MG/5ML IV SOSY
PREFILLED_SYRINGE | INTRAVENOUS | Status: DC | PRN
  Administered 2018-07-23: 40 mg via INTRAVENOUS

## 2018-07-23 MED ORDER — LACTATED RINGERS IV SOLN
INTRAVENOUS | Status: DC
  Administered 2018-07-23: 12:00:00 via INTRAVENOUS

## 2018-07-23 MED ORDER — STERILE WATER FOR IRRIGATION IR SOLN
Status: DC | PRN
  Administered 2018-07-23: .05 mL

## 2018-07-23 SURGICAL SUPPLY — 7 items
CANISTER SUCT 1200ML W/VALVE (MISCELLANEOUS) ×3 IMPLANT
GOWN CVR UNV OPN BCK APRN NK (MISCELLANEOUS) ×4 IMPLANT
GOWN ISOL THUMB LOOP REG UNIV (MISCELLANEOUS) ×2
KIT ENDO PROCEDURE OLY (KITS) ×3 IMPLANT
SNARE COLD EXACTO (MISCELLANEOUS) ×3 IMPLANT
TRAP ETRAP POLY (MISCELLANEOUS) ×3 IMPLANT
WATER STERILE IRR 250ML POUR (IV SOLUTION) ×3 IMPLANT

## 2018-07-23 NOTE — Transfer of Care (Signed)
Immediate Anesthesia Transfer of Care Note  Patient: Paul Rodriguez  Procedure(s) Performed: COLONOSCOPY WITH PROPOFOL (N/A Rectum) POLYPECTOMY (Rectum)  Patient Location: PACU  Anesthesia Type: General  Level of Consciousness: awake, alert  and patient cooperative  Airway and Oxygen Therapy: Patient Spontanous Breathing and Patient connected to supplemental oxygen  Post-op Assessment: Post-op Vital signs reviewed, Patient's Cardiovascular Status Stable, Respiratory Function Stable, Patent Airway and No signs of Nausea or vomiting  Post-op Vital Signs: Reviewed and stable  Complications: No apparent anesthesia complications

## 2018-07-23 NOTE — Anesthesia Postprocedure Evaluation (Signed)
Anesthesia Post Note  Patient: Paul Rodriguez  Procedure(s) Performed: COLONOSCOPY WITH PROPOFOL (N/A Rectum) POLYPECTOMY (Rectum)  Patient location during evaluation: PACU Anesthesia Type: General Level of consciousness: awake and alert Pain management: pain level controlled Vital Signs Assessment: post-procedure vital signs reviewed and stable Respiratory status: spontaneous breathing Cardiovascular status: stable Anesthetic complications: no    Jaci Standard, III,  Ayona Yniguez D

## 2018-07-23 NOTE — Op Note (Signed)
[facility]astroenterology Patient Name: Paul Rodriguez Procedure Date: 07/23/2018 10:43 AM MRN: [id number] Account #: [id number] Date of Birth: [birth year] Admit Type: Inpatient Age: 68 Room: [facility] OR ROOM 01 Gender: Male Note Status: Finalized Procedure:            Colonoscopy Indications:          Screening for colorectal malignant neoplasm, Last                        colonoscopy: December 2009 Providers:            Lin Landsman MD, MD Referring MD:         Guadalupe Maple, MD (Referring MD) Medicines:            Monitored Anesthesia Care Complications:        No immediate complications. Estimated blood loss: None. Procedure:            Pre-Anesthesia Assessment:                       - Prior to the procedure, a History and Physical was                        performed, and patient medications and allergies were                        reviewed. The patient is competent. The risks and                        benefits of the procedure and the sedation options and                        risks were discussed with the patient. All questions                        were answered and informed consent was obtained.                        Patient identification and proposed procedure were                        verified by the physician, the nurse, the                        anesthesiologist, the anesthetist and the technician in                        the pre-procedure area in the procedure room in the                        endoscopy suite. Mental Status Examination: alert and                        oriented. Airway Examination: normal oropharyngeal                        airway and neck mobility. Respiratory Examination:                        clear to auscultation. CV Examination: normal.  Prophylactic Antibiotics: The patient does not require                        prophylactic antibiotics. Prior Anticoagulants: The   patient has taken no previous anticoagulant or                        antiplatelet agents. ASA Grade Assessment: II - A                        patient with mild systemic disease. After reviewing the                        risks and benefits, the patient was deemed in                        satisfactory condition to undergo the procedure. The                        anesthesia plan was to use monitored anesthesia care                        (MAC). Immediately prior to administration of                        medications, the patient was re-assessed for adequacy                        to receive sedatives. The heart rate, respiratory rate,                        oxygen saturations, blood pressure, adequacy of                        pulmonary ventilation, and response to care were                        monitored throughout the procedure. The physical status                        of the patient was re-assessed after the procedure.                       After obtaining informed consent, the colonoscope was                        passed under direct vision. Throughout the procedure,                        the patient's blood pressure, pulse, and oxygen                        saturations were monitored continuously. The was                        introduced through the anus and advanced to the the                        cecum, identified by appendiceal orifice and ileocecal  valve. The colonoscopy was performed without                        difficulty. The patient tolerated the procedure well.                        The quality of the bowel preparation was evaluated                        using the BBPS [facility] Bowel Preparation Scale) with                        scores of: Right Colon = 2 (minor amount of residual                        staining, small fragments of stool and/or opaque                        liquid, but mucosa seen well), Transverse Colon = 3                         (entire mucosa seen well with no residual staining,                        small fragments of stool or opaque liquid) and Left                        Colon = 3 (entire mucosa seen well with no residual                        staining, small fragments of stool or opaque liquid).                        The total BBPS score equals 8. Findings:      The perianal and digital rectal examinations were normal. Pertinent       negatives include normal sphincter tone and no palpable rectal lesions.      Two sessile polyps were found in the transverse colon. The polyps were 2       to 5 mm in size. These polyps were removed with a cold snare. Resection       and retrieval were complete.      Multiple diverticula were found in the sigmoid colon and descending       colon. There was no evidence of diverticular bleeding.      The retroflexed view of the distal rectum and anal verge was normal and       showed no anal or rectal abnormalities. Impression:           - Two 2 to 5 mm polyps in the transverse colon, removed                        with a cold snare. Resected and retrieved.                       - Moderate diverticulosis in the sigmoid colon and in                        the descending colon. There was  no evidence of                        diverticular bleeding.                       - The distal rectum and anal verge are normal on                        retroflexion view. Recommendation:       - Discharge patient to home (with spouse).                       - Resume previous diet today.                       - Continue present medications.                       - Await pathology results.                       - Repeat colonoscopy in 7 years for surveillance based                        on pathology results. Procedure Code(s):    --- Professional ---                       [phone], Colonoscopy, flexible; with removal of tumor(s),                        polyp(s), or other lesion(s) by  snare technique Diagnosis Code(s):    --- Professional ---                       Z12.11, Encounter for screening for malignant neoplasm                        of colon                       D12.3, Benign neoplasm of transverse colon (hepatic                        flexure or splenic flexure)                       K57.30, Diverticulosis of large intestine without                        perforation or abscess without bleeding CPT copyright 2018 American Medical Association. All rights reserved. The codes documented in this report are preliminary and upon coder review may  be revised to meet current compliance requirements. Dr. Ulyess Mort Lin Landsman MD, MD 07/23/2018 12:04:03 PM This report has been signed electronically. Number of Addenda: 0 Note Initiated On: 07/23/2018 10:43 AM Scope Withdrawal Time: 0 hours 14 minutes 44 seconds  Total Procedure Duration: 0 hours 20 minutes 0 seconds       [facility]

## 2018-07-23 NOTE — Anesthesia Procedure Notes (Signed)
Procedure Name: MAC Date/Time: 07/23/2018 11:36 AM Performed by: Janna Arch, CRNA Pre-anesthesia Checklist: Patient identified, Emergency Drugs available, Suction available and Patient being monitored Patient Re-evaluated:Patient Re-evaluated prior to induction Oxygen Delivery Method: Nasal cannula

## 2018-07-23 NOTE — Anesthesia Preprocedure Evaluation (Signed)
Anesthesia Evaluation  Patient identified by MRN, date of birth, ID band Patient awake    Reviewed: Allergy & Precautions, H&P , NPO status , Patient's Chart, lab work & pertinent test results  History of Anesthesia Complications Negative for: history of anesthetic complications  Airway Mallampati: II  TM Distance: >3 FB Neck ROM: full    Dental no notable dental hx.    Pulmonary former smoker,    Pulmonary exam normal breath sounds clear to auscultation       Cardiovascular hypertension, On Medications Normal cardiovascular exam Rhythm:regular Rate:Normal     Neuro/Psych negative neurological ROS     GI/Hepatic Neg liver ROS, Medicated,  Endo/Other  negative endocrine ROS  Renal/GU negative Renal ROS  negative genitourinary   Musculoskeletal   Abdominal   Peds  Hematology negative hematology ROS (+)   Anesthesia Other Findings   Reproductive/Obstetrics negative OB ROS                             Anesthesia Physical Anesthesia Plan  ASA: II  Anesthesia Plan: General   Post-op Pain Management:    Induction:   PONV Risk Score and Plan:   Airway Management Planned:   Additional Equipment:   Intra-op Plan:   Post-operative Plan:   Informed Consent: I have reviewed the patients History and Physical, chart, labs and discussed the procedure including the risks, benefits and alternatives for the proposed anesthesia with the patient or authorized representative who has indicated his/her understanding and acceptance.       Plan Discussed with:   Anesthesia Plan Comments:         Anesthesia Quick Evaluation

## 2018-07-23 NOTE — H&P (Signed)
Cephas Darby, MD [address]  [place]  [place], [place] [address]  Main: [phone]  Fax: [phone] Pager: [phone]  Primary Care Physician:  Guadalupe Maple, MD Primary Gastroenterologist:  Dr. Cephas Darby  Pre-Procedure History & Physical: HPI:  Paul Rodriguez is a 68 y.o. male is here for an colonoscopy.   Past Medical History:  Diagnosis Date  . Allergy   . Arthritis    Rheumatoid arthritis  . Depression   . GERD (gastroesophageal reflux disease)   . History of kidney stones   . Hyperlipidemia   . Hypertension     Past Surgical History:  Procedure Laterality Date  . HIP SURGERY Right 2014   arthroscopic  . HIP SURGERY Left Nov 2014   arthroscopic  . KNEE SURGERY Left Nov 2014  . NOSE SURGERY      Prior to Admission medications   Medication Sig Start Date End Date Taking? Authorizing Provider  aspirin 81 MG tablet Take 81 mg by mouth daily.   Yes [provider]  FOLIC ACID PO Take by mouth daily.   Yes [provider]  ibuprofen (ADVIL,MOTRIN) 200 MG tablet Take 400 mg by mouth daily.   Yes [provider]  loratadine-pseudoephedrine (CLARITIN-D 12-HOUR) 5-120 MG tablet Take by mouth every 12 (twelve) hours as needed.   Yes [provider]  losartan (COZAAR) 100 MG tablet Take 1 tablet (100 mg total) by mouth daily. 06/17/18  Yes Crissman, Jeannette How, MD  methotrexate (RHEUMATREX) 2.5 MG tablet Take 15 mg by mouth once a week.  09/23/17  Yes [provider]  Multiple Vitamin (MULTIVITAMIN) tablet Take 1 tablet by mouth daily.   Yes [provider]  tadalafil (CIALIS) 5 MG tablet Take 1 tablet (5 mg total) by mouth daily as needed for erectile dysfunction. 01/13/18  Yes Volney American, PA-C  EPINEPHrine (EPIPEN 2-PAK) 0.3 mg/0.3 mL IJ SOAJ injection  11/17/12   [provider]  rosuvastatin (CRESTOR) 5 MG tablet Take 1 tablet (5 mg total) by mouth daily. Patient not taking:  Reported on 06/17/2018 12/12/17   Guadalupe Maple, MD    Allergies as of 06/19/2018 - Review Complete 06/17/2018  Allergen Reaction Noted  . Ultram [tramadol] Anaphylaxis 07/29/2015    Family History  Problem Relation Age of Onset  . Hypertension Mother   . Hypertension Father   . Heart disease Father   . Stroke Father   . Diabetes Maternal Grandfather   . Heart disease Maternal Grandmother   . Cancer Paternal Grandmother   . Cancer Paternal Grandfather     Social History   Socioeconomic History  . Marital status: Married    Spouse name: Not on file  . Number of children: Not on file  . Years of education: Not on file  . Highest education level: Not on file  Occupational History  . Not on file  Social Needs  . Financial resource strain: Not on file  . Food insecurity:    Worry: Not on file    Inability: Not on file  . Transportation needs:    Medical: Not on file    Non-medical: Not on file  Tobacco Use  . Smoking status: Former Smoker    Last attempt to quit: 05/22/1991    Years since quitting: 27.1  . Smokeless tobacco: Never Used  Substance and Sexual Activity  . Alcohol use: Yes    Alcohol/week: 2.0 standard drinks    Types: 2  Cans of beer per week    Comment: on occasion/socially  . Drug use: No  . Sexual activity: Yes  Lifestyle  . Physical activity:    Days per week: Not on file    Minutes per session: Not on file  . Stress: Not on file  Relationships  . Social connections:    Talks on phone: Not on file    Gets together: Not on file    Attends religious service: Not on file    Active member of club or organization: Not on file    Attends meetings of clubs or organizations: Not on file    Relationship status: Not on file  . Intimate partner violence:    Fear of current or ex partner: Not on file    Emotionally abused: Not on file    Physically abused: Not on file    Forced sexual activity: Not on file  Other Topics Concern  . Not on file    Social History Narrative  . Not on file    Review of Systems: See HPI, otherwise negative ROS  Physical Exam: BP (!) 150/87   Pulse 93   Temp (!) 97.3 F (36.3 C) (Temporal)   Resp 17   Ht 5\' 9"  (1.753 m)   Wt 104.2 kg   SpO2 99%   BMI 33.94 kg/m  General:   Alert,  pleasant and cooperative in NAD Head:  Normocephalic and atraumatic. Neck:  Supple; no masses or thyromegaly. Lungs:  Clear throughout to auscultation.    Heart:  Regular rate and rhythm. Abdomen:  Soft, nontender and nondistended. Normal bowel sounds, without guarding, and without rebound.   Neurologic:  Alert and  oriented x4;  grossly normal neurologically.  Impression/Plan: Paul Rodriguez is here for an colonoscopy to be performed for colon cancer screening  Risks, benefits, limitations, and alternatives regarding  colonoscopy have been reviewed with the patient.  Questions have been answered.  All parties agreeable.   Sherri Sear, MD  07/23/2018, 10:47 AM

## 2018-07-24 ENCOUNTER — Encounter: Payer: Self-pay | Admitting: Gastroenterology

## 2018-09-22 DIAGNOSIS — M1990 Unspecified osteoarthritis, unspecified site: Secondary | ICD-10-CM | POA: Diagnosis not present

## 2018-09-22 DIAGNOSIS — Z79899 Other long term (current) drug therapy: Secondary | ICD-10-CM | POA: Diagnosis not present

## 2018-10-09 ENCOUNTER — Encounter: Payer: Self-pay | Admitting: Family Medicine

## 2018-10-09 ENCOUNTER — Telehealth: Payer: Self-pay | Admitting: Family Medicine

## 2018-10-09 ENCOUNTER — Other Ambulatory Visit: Payer: Self-pay

## 2018-10-09 ENCOUNTER — Telehealth: Payer: Self-pay | Admitting: *Deleted

## 2018-10-09 ENCOUNTER — Ambulatory Visit: Payer: Self-pay

## 2018-10-09 ENCOUNTER — Ambulatory Visit (INDEPENDENT_AMBULATORY_CARE_PROVIDER_SITE_OTHER): Payer: Medicare Other | Admitting: Family Medicine

## 2018-10-09 VITALS — BP 136/98 | HR 70 | Temp 96.9°F | Wt 235.0 lb

## 2018-10-09 DIAGNOSIS — Z20822 Contact with and (suspected) exposure to covid-19: Secondary | ICD-10-CM

## 2018-10-09 DIAGNOSIS — R6889 Other general symptoms and signs: Secondary | ICD-10-CM

## 2018-10-09 DIAGNOSIS — J3089 Other allergic rhinitis: Secondary | ICD-10-CM

## 2018-10-09 MED ORDER — MONTELUKAST SODIUM 10 MG PO TABS: 10.0000 mg | ORAL_TABLET | Freq: Every day | ORAL | 1 refills | Status: DC

## 2018-10-09 NOTE — Telephone Encounter (Signed)
Patient needs covid testing.67yo,Rheumatoid Arthritis on methotrexate (immunosuppresed), with chills and sweats, body aches, SOB, cough for3-4 days. Please set up ASAP Pt. Name:Paul Rodriguez DOB:1951/01/24 YFV:[id number]  Reason for COVID-19 test:Rheumatoid Arthritis on methotrexate (immunosuppresed), with chills and sweats, body aches, SOB, cough for3-4 days.Please set up ASAP   Insurance information (name and policy number): Insurance eligibility:    Please provide Insurance Information and send to [place]

## 2018-10-09 NOTE — Telephone Encounter (Signed)
Pt scheduled for Covid-19 testing per Dr. Carron Brazen Johnson's orders. Appt secured for tomorrow 10/10/2018 at 0900. Pt is symptomatic. Testing process reviewed, verbalizes understanding.

## 2018-10-09 NOTE — Progress Notes (Signed)
BP (!) 136/98   Pulse 70   Temp (!) 96.9 F (36.1 C)   Wt 235 lb (106.6 kg)   BMI 34.70 kg/m    Subjective:    Patient ID: Paul Rodriguez, male    DOB: 12/08/1950, 68 y.o.   MRN: [id number]  HPI: [name] is a 68 y.o. male  Chief Complaint  Patient presents with  . Cough    feverish and upset stomach and fatigue   UPPER RESPIRATORY TRACT INFECTION- notes that he has a lot of allergies and is concerned that his allergies have been acting up. Has been taking allegra-D without much of a benefit over the past couple of days.  Duration: about a month- getting worse over the last week Worst symptom: cough, not feeling well Fever: no, chills and sweats Cough: yes Shortness of breath: yes Wheezing: no Chest pain: no Chest tightness: yes Chest congestion: yes Nasal congestion: yes Runny nose: no Post nasal drip: no Sneezing: no Sore throat: no Swollen glands: no Sinus pressure: yes Headache: yes Face pain: no Toothache: no Ear pain: no  Ear pressure: no  Eyes red/itching:no Eye drainage/crusting: no  Vomiting: no Rash: no Fatigue: yes Sick contacts: yes- his wife was tested negative about 2 weeks ago Strep contacts: no  Context: worse Recurrent sinusitis: no Relief with OTC cold/cough medications: no  Treatments attempted: anti-histamine and pseudoephedrine    Relevant past medical, surgical, family and social history reviewed and updated as indicated. Interim medical history since our last visit reviewed. Allergies and medications reviewed and updated.  Review of Systems  Constitutional: Positive for chills, diaphoresis and fatigue. Negative for activity change, appetite change, fever and unexpected weight change.  HENT: Positive for congestion, postnasal drip, rhinorrhea, sneezing and voice change. Negative for dental problem, drooling, ear discharge, ear pain, facial swelling, hearing loss, mouth sores, nosebleeds, sinus pressure, sinus pain, sore  throat, tinnitus and trouble swallowing.   Respiratory: Positive for cough, chest tightness and shortness of breath. Negative for apnea, choking, wheezing and stridor.   Cardiovascular: Negative.   Musculoskeletal: Positive for arthralgias and myalgias. Negative for back pain, gait problem, joint swelling, neck pain and neck stiffness.  Skin: Negative.   Psychiatric/Behavioral: Negative.     Per HPI unless specifically indicated above     Objective:    BP (!) 136/98   Pulse 70   Temp (!) 96.9 F (36.1 C)   Wt 235 lb (106.6 kg)   BMI 34.70 kg/m   Wt Readings from Last 3 Encounters:  10/09/18 235 lb (106.6 kg)  07/23/18 229 lb 12.8 oz (104.2 kg)  06/17/18 234 lb (106.1 kg)    Physical Exam Vitals signs and nursing note reviewed.  Constitutional:      General: He is not in acute distress.    Appearance: Normal appearance. He is not ill-appearing, toxic-appearing or diaphoretic.  HENT:     Head: Normocephalic and atraumatic.     Right Ear: External ear normal.     Left Ear: External ear normal.     Nose: Nose normal.     Mouth/Throat:     Mouth: Mucous membranes are moist.     Pharynx: Oropharynx is clear.  Eyes:     General: No scleral icterus.       Right eye: No discharge.        Left eye: No discharge.     Conjunctiva/sclera: Conjunctivae normal.     Pupils: Pupils are equal, round, and reactive  to light.  Neck:     Musculoskeletal: Normal range of motion.  Pulmonary:     Effort: Pulmonary effort is normal. No respiratory distress.     Comments: Speaking in full sentences Musculoskeletal: Normal range of motion.  Skin:    Coloration: Skin is not jaundiced or pale.     Findings: No bruising, erythema, lesion or rash.  Neurological:     Mental Status: He is alert and oriented to person, place, and time. Mental status is at baseline.  Psychiatric:        Mood and Affect: Mood normal.        Behavior: Behavior normal.        Thought Content: Thought content  normal.        Judgment: Judgment normal.     Results for orders placed or performed in visit on 89/38/10  Basic metabolic panel  Result Value Ref Range   Glucose 113 (H) 65 - 99 mg/dL   BUN 16 8 - 27 mg/dL   Creatinine, Ser 1.16 0.76 - 1.27 mg/dL   GFR calc non Af Amer 65 >59 mL/min/1.73   GFR calc Af Amer 75 >59 mL/min/1.73   BUN/Creatinine Ratio 14 10 - 24   Sodium 142 134 - 144 mmol/L   Potassium 4.1 3.5 - 5.2 mmol/L   Chloride 104 96 - 106 mmol/L   CO2 23 20 - 29 mmol/L   Calcium 9.5 8.6 - 10.2 mg/dL  LP+ALT+AST Piccolo, Waived  Result Value Ref Range   ALT (SGPT) Piccolo, Waived 30 10 - 47 U/L   AST (SGOT) Piccolo, Waived 29 11 - 38 U/L   Cholesterol Piccolo, Waived 149 <200 mg/dL   HDL Chol Piccolo, Waived 43 (L) >59 mg/dL   Triglycerides Piccolo,Waived 193 (H) <150 mg/dL   Chol/HDL Ratio Piccolo,Waive 3.5 mg/dL   LDL Chol Calc Piccolo Waived 67 <100 mg/dL   VLDL Chol Calc Piccolo,Waive 39 (H) <30 mg/dL      Assessment & Plan:   Problem List Items Addressed This Visit    None    Visit Diagnoses    Suspected Covid-19 Virus Infection    -  Primary   Given immunocompromised state and age, will arrange for testing. Await results. Self-quarantine until results available and no fever for >72 hours.   Relevant Orders   Temperature monitoring   Non-seasonal allergic rhinitis, unspecified trigger       Will start singulair to help. Call with any concerns or if not getting better.       Follow up plan: Return Next week- video visit, follow up symptoms.   . This visit was completed via FaceTime due to the restrictions of the COVID-19 pandemic. All issues as above were discussed and addressed. Physical exam was done as above through visual confirmation on FaceTime. If it was felt that the patient should be evaluated in the office, they were directed there. The patient verbally consented to this visit. . Location of the patient: home . Location of the provider: home .  Those involved with this call:  . Provider: Park Liter, DO . CMA: Tiffany Reel, CMA . Front Desk/Registration: Don Perking  . Time spent on call: 25 minutes with patient face to face via video conference. More than 50% of this time was spent in counseling and coordination of care. 40 minutes total spent in review of patient's record and preparation of their chart.

## 2018-10-09 NOTE — Telephone Encounter (Signed)
Incoming call from Patient complaint with With  A little of SoB, due to arthritis,  Onset was 3 weeks ago.    Has a dry cough.    Ususally has allergies.  States he is always  Tired.    Denies a fever.  Allergies worse the year.  Feels the same as yesterday. Patient schedule for app.     Answer Assessment - Initial Assessment Questions 1. COVID-19 DIAGNOSIS: "Who made your Coronavirus (COVID-19) diagnosis?" "Was it confirmed by a positive lab test?" If not diagnosed by a HCP, ask "Are there lots of cases (community spread) where you live?" (See public health department website, if unsure)   * MAJOR community spread: high number of cases; numbers of cases are increasing; many people hospitalized.   * MINOR community spread: low number of cases; not increasing; few or no people hospitalized     *No Answer* 2. ONSET: "When did the COVID-19 symptoms start?"     3 to 4Weeks ago 3. WORST SYMPTOM: "What is your worst symptom?" (e.g., cough, fever, shortness of breath, muscle aches)     SOB, muscle aches due to arthis 4. COUGH: "Do you have a cough?" If so, ask: "How bad is the cough?"      dry 5. FEVER: "Do you have a fever?" If so, ask: "What is your temperature, how was it measured, and when did it start?"     denies 6. RESPIRATORY STATUS: "Describe your breathing?" (e.g., shortness of breath, wheezing, unable to speak)     A little  SOB 7. BETTER-SAME-WORSE: "Are you getting better, staying the same or getting worse compared to yesterday?"  If getting worse, ask, "In what way?"       [facility] RISK DISEASE: "Do you have any chronic medical problems?" (e.g., asthma, heart or lung disease, weak immune system, etc.)     denies 9. PREGNANCY: "Is there any chance you are pregnant?" "When was your last menstrual period?"    na 10. OTHER SYMPTOMS: "Do you have any other symptoms?"  (e.g., runny nose, headache, sore throat, loss of smell)       Denies  Protocols used: CORONAVIRUS (COVID-19) DIAGNOSED  OR SUSPECTED-A-AH

## 2018-10-09 NOTE — Telephone Encounter (Signed)
Pt has FaceTime visit with Dr Wynetta Emery later today. Sending information to provider as well as Dr. Wynetta Emery.

## 2018-10-09 NOTE — Telephone Encounter (Signed)
      [name] Male, 68 y.o., 02-01-1951 MRN:  [id number] Phone:  [phone] Paul Rodriguez) PCP:  Paul Maple, MD Primary Cvg:  [place] PART A AND B Next Appt With Family Medicine ([place]) 01/05/2019 at 1:00 PM Patient Demographics   Patient Name Paul Rodriguez, Paul Rodriguez Sex Male DOB 29-Jun-1950 SSN [id number] Address [place] [address] Phone [phone] [facility]) [phone] [facility])  Emergency Contact(s)   Name Relation Home Work Mobile  Paul Rodriguez, Paul Rodriguez  [phone]    Patient-Level Documents:   There are no patient-level documents.  Guarantor Account: Paul Rodriguez, Paul Rodriguez ([id number])   Relation to Patient: Account Type Service Area  Self Personal/Family [place] for This Account    Coverage ID Payor Plan Insurance ID  [id number] MEDICARE MEDICARE PART A AND B [id number]  [id number] [facility] [place] [id number]    Guarantor Account: Paul Rodriguez, Paul Rodriguez ([id number])   Relation to Patient: Account Type Service Area  Self Personal/Family [place] for This Account    Coverage ID Payor Plan Insurance ID  [id number] MEDICARE MEDICARE PART A AND B [id number]  [id number] [employer] [place] [employer] [facility] SUP [id number]    Guarantor Account: Paul Rodriguez, Paul Rodriguez ([id number])   Relation to Patient: Account Type Service Area  Self Personal/Family [place]      PCP and Center   Primary Care Provider  Paul Maple, MD  Phone: [place]   Patient Employment   Status Employer  Retired [employer]  01/05/2019 1:00 PM [facility] [place]  01/05/2019 1:45 PM Paul Maple, MD [place]  Last 5 Appointments   Date Time Status Provider Department Type Appt Notes  01/05/2019 1:45 PM  Sch Paul Maple, MD [facility]FP-[facility] OV Follow up also seeing NHA please add modifier   01/05/2019 1:00 PM Sch [facility] NURSE HEALTH ADVISOR CFP-CFP [place] AWV-I Also seeing Dr. Jeananne Rama  10/09/2018 4:00 PM Comp Park Liter P, DO CFP-CFP OV facetime - allergies  [phone]  06/17/2018 9:30 AM Comp Paul Maple, MD CFP-CFP OV Room 5/ 6 mo fu  02/26/2018 10:45 AM Comp Georgina Peer, CMA CFP-CFP NURSE flu shot and pnuemonia shot

## 2018-10-10 ENCOUNTER — Other Ambulatory Visit: Payer: Medicare Other

## 2018-10-10 DIAGNOSIS — R6889 Other general symptoms and signs: Secondary | ICD-10-CM | POA: Diagnosis not present

## 2018-10-10 DIAGNOSIS — Z20822 Contact with and (suspected) exposure to covid-19: Secondary | ICD-10-CM

## 2018-10-12 LAB — NOVEL CORONAVIRUS, NAA: SARS-CoV-2, NAA: NOT DETECTED

## 2018-10-15 ENCOUNTER — Encounter: Payer: Self-pay | Admitting: Family Medicine

## 2018-10-20 DIAGNOSIS — B301 Conjunctivitis due to adenovirus: Secondary | ICD-10-CM | POA: Diagnosis not present

## 2018-12-01 ENCOUNTER — Other Ambulatory Visit: Payer: Self-pay | Admitting: Family Medicine

## 2018-12-01 DIAGNOSIS — I10 Essential (primary) hypertension: Secondary | ICD-10-CM

## 2018-12-01 NOTE — Telephone Encounter (Signed)
Please see note from pharmacy

## 2018-12-23 DIAGNOSIS — M1990 Unspecified osteoarthritis, unspecified site: Secondary | ICD-10-CM | POA: Diagnosis not present

## 2018-12-23 DIAGNOSIS — Z79899 Other long term (current) drug therapy: Secondary | ICD-10-CM | POA: Diagnosis not present

## 2018-12-30 DIAGNOSIS — M791 Myalgia, unspecified site: Secondary | ICD-10-CM | POA: Diagnosis not present

## 2018-12-30 DIAGNOSIS — M1990 Unspecified osteoarthritis, unspecified site: Secondary | ICD-10-CM | POA: Diagnosis not present

## 2019-01-01 NOTE — Progress Notes (Signed)
Subjective:   Paul Rodriguez is a 68 y.o. male who presents for an Initial Medicare Annual Wellness Visit.  This visit is being conducted via phone call  - after an attmept to do on video chat - due to the COVID-19 pandemic. This patient has given me verbal consent via phone to conduct this visit, patient states they are participating from their home address. Some vital signs may be absent or patient reported.   Patient identification: identified by name, DOB, and current address.    Review of Systems   Cardiac Risk Factors include: advanced age (>70men, >31 women);male gender;dyslipidemia;hypertension;obesity (BMI >30kg/m2)    Objective:    Today's Vitals   01/05/19 1300  BP: (!) 137/93  Pulse: 70  Weight: 242 lb (109.8 kg)  Height: 5\' 9"  (1.753 m)  PainSc: 3    Body mass index is 35.74 kg/m.  Advanced Directives 01/05/2019 07/23/2018 05/02/2017  Does Patient Have a Medical Advance Directive? Yes Yes Yes  Type of Paramedic of [place];Living will [place];Living will [place];Living will  Does patient want to make changes to medical advance directive? - No - Patient declined -  Copy of [place] in Chart? No - copy requested No - copy requested -    Current Medications (verified) Outpatient Encounter Medications as of 01/05/2019  Medication Sig  . aspirin 81 MG tablet Take 81 mg by mouth daily.  Marland Kitchen FOLIC ACID PO Take by mouth daily.  Marland Kitchen loratadine-pseudoephedrine (CLARITIN-D 12-HOUR) 5-120 MG tablet Take by mouth every 12 (twelve) hours as needed.  . meloxicam (MOBIC) 7.5 MG tablet Take by mouth.  . methotrexate (RHEUMATREX) 2.5 MG tablet Take 15 mg by mouth once a week.   . montelukast (SINGULAIR) 10 MG tablet Take 1 tablet (10 mg total) by mouth at bedtime.  . Multiple Vitamin (MULTIVITAMIN) tablet Take 1 tablet by mouth daily.  Marland Kitchen telmisartan (MICARDIS) 80 MG tablet Take 1 tablet (80 mg  total) by mouth daily.  Marland Kitchen EPINEPHrine (EPIPEN 2-PAK) 0.3 mg/0.3 mL IJ SOAJ injection   . ibuprofen (ADVIL,MOTRIN) 200 MG tablet Take 400 mg by mouth daily.  . rosuvastatin (CRESTOR) 5 MG tablet Take 1 tablet (5 mg total) by mouth daily. (Patient not taking: Reported on 06/17/2018)  . tadalafil (CIALIS) 5 MG tablet Take 1 tablet (5 mg total) by mouth daily as needed for erectile dysfunction. (Patient not taking: Reported on 10/09/2018)   No facility-administered encounter medications on file as of 01/05/2019.     Allergies (verified) Ultram [tramadol]   History: Past Medical History:  Diagnosis Date  . Allergy   . Arthritis    Rheumatoid arthritis  . Depression   . GERD (gastroesophageal reflux disease)   . History of kidney stones   . Hyperlipidemia   . Hypertension    Past Surgical History:  Procedure Laterality Date  . COLONOSCOPY WITH PROPOFOL N/A 07/23/2018   Procedure: COLONOSCOPY WITH PROPOFOL;  Surgeon: Lin Landsman, MD;  Location: [place];  Service: Endoscopy;  Laterality: N/A;  Requests early  . EYE SURGERY  Lasik  . HIP SURGERY Right 2014   arthroscopic  . HIP SURGERY Left Nov 2014   arthroscopic  . KNEE SURGERY Left Nov 2014  . NOSE SURGERY    . POLYPECTOMY  07/23/2018   Procedure: POLYPECTOMY;  Surgeon: Lin Landsman, MD;  Location: [place];  Service: Endoscopy;;   Family History  Problem Relation Age  of Onset  . Hypertension Mother   . Hypertension Father   . Heart disease Father   . Stroke Father   . Diabetes Maternal Grandfather   . Heart disease Maternal Grandmother   . Cancer Paternal Grandmother   . Cancer Paternal Grandfather    Social History   Socioeconomic History  . Marital status: Married    Spouse name: Not on file  . Number of children: Not on file  . Years of education: Not on file  . Highest education level: Associate degree: academic program  Occupational History  . Occupation: retired   Scientific laboratory technician   . Financial resource strain: Not hard at all  . Food insecurity    Worry: Never true    Inability: Never true  . Transportation needs    Medical: No    Non-medical: No  Tobacco Use  . Smoking status: Former Smoker    Packs/day: 1.00    Years: 10.00    Pack years: 10.00    Types: Cigarettes    Quit date: 05/22/1991    Years since quitting: 27.6  . Smokeless tobacco: Never Used  Substance and Sexual Activity  . Alcohol use: Yes    Alcohol/week: 2.0 standard drinks    Types: 2 Cans of beer per week    Comment: on occasion/socially  . Drug use: No  . Sexual activity: Yes    Birth control/protection: None  Lifestyle  . Physical activity    Days per week: 0 days    Minutes per session: 0 min  . Stress: Not at all  Relationships  . Social connections    Talks on phone: More than three times a week    Gets together: More than three times a week    Attends religious service: 1 to 4 times per year    Active member of club or organization: No    Attends meetings of clubs or organizations: Never    Relationship status: Married  Other Topics Concern  . Not on file  Social History Narrative   Stays active around the house   Tobacco Counseling Counseling given: Not Answered   Clinical Intake:  Pre-visit preparation completed: Yes  Pain : 0-10 Pain Score: 3  Pain Type: Chronic pain Pain Descriptors / Indicators: Aching Pain Onset: More than a month ago Pain Frequency: Constant     Nutritional Risks: None Diabetes: No  How often do you need to have someone help you when you read instructions, pamphlets, or other written materials from your doctor or pharmacy?: 1 - Never  Interpreter Needed?: No  Information entered by ::  ,LPN  Activities of Daily Living In your present state of health, do you have any difficulty performing the following activities: 01/05/2019 07/23/2018  Hearing? N N  Comment no hearing aids -  Vision? N N  Comment readin glasses -   Difficulty concentrating or making decisions? N N  Walking or climbing stairs? N N  Dressing or bathing? N N  Doing errands, shopping? N -  Preparing Food and eating ? N -  Using the Toilet? N -  In the past six months, have you accidently leaked urine? N -  Do you have problems with loss of bowel control? N -  Managing your Medications? N -  Managing your Finances? N -  Housekeeping or managing your Housekeeping? N -  Some recent data might be hidden     Immunizations and Health Maintenance Immunization History  Administered Date(s) Administered  .  Influenza, High Dose Seasonal PF 02/26/2018  . Influenza-Unspecified 02/21/2015, 01/17/2016  . Pneumococcal Conjugate-13 02/26/2018  . Pneumococcal Polysaccharide-23 02/23/2016  . Tdap 12/17/2013  . Zoster 02/23/2016   Health Maintenance Due  Topic Date Due  . INFLUENZA VACCINE  12/20/2018    Patient Care Team: Guadalupe Maple, MD as PCP - General (Family Medicine) Lucilla Lame, MD as Consulting Physician (Gastroenterology) Jac Canavan, MD (Unknown Physician Specialty) Emmaline Kluver., MD (Rheumatology)  Indicate any recent Medical Services you may have received from other than [facility] providers in the past year (date may be approximate).    Assessment:   This is a routine wellness examination for Terryn.  Hearing/Vision screen  Hearing Screening   125Hz  250Hz  500Hz  1000Hz  2000Hz  3000Hz  4000Hz  6000Hz  8000Hz   Right ear:           Left ear:           Vision Screening Comments: Sees dr.[email or web address]  Dietary issues and exercise activities discussed: Current Exercise Habits: The patient does not participate in regular exercise at present, Exercise limited by: None identified  Goals    . Exercise 3x per week (30 min per time) (pt-stated)     Discussed walking for 30 min 3x a week.       Depression Screen PHQ 2/9 Scores 01/05/2019 02/14/2017 08/13/2016 08/03/2015  PHQ - 2 Score 0 0 0 0    Fall Risk Fall Risk   01/05/2019 02/14/2017 08/13/2016 08/03/2015  Falls in the past year? 0 No No No    FALL RISK PREVENTION PERTAINING TO THE HOME:  Any stairs in or around the home? Yes  If so, are there any without handrails? No   Home free of loose throw rugs in walkways, pet beds, electrical cords, etc? Yes  Adequate lighting in your home to reduce risk of falls? Yes   ASSISTIVE DEVICES UTILIZED TO PREVENT FALLS:  Life alert? No  Use of a cane, walker or w/c? No  Grab bars in the bathroom? No  Shower chair or bench in shower? No  Elevated toilet seat or a handicapped toilet? No    TIMED UP AND GO:  Unable to perform   Cognitive Function:        Screening Tests Health Maintenance  Topic Date Due  . INFLUENZA VACCINE  12/20/2018  . PNA vac Low Risk Adult (2 of 2 - PPSV23) 02/22/2021  . TETANUS/TDAP  12/18/2023  . COLONOSCOPY  07/22/2025  . Hepatitis C Screening  Completed    Qualifies for Shingles Vaccine? Yes  Zostavax completed 02/23/2016. Due for Shingrix. Education has been provided regarding the importance of this vaccine. Pt has been advised to call insurance company to determine out of pocket expense. Advised may also receive vaccine at local pharmacy or Health Dept. Verbalized acceptance and understanding.  Tdap: up to date  Flu Vaccine: Due 01/2019  Pneumococcal Vaccine: up to date, due 02/27/2019  Cancer Screenings:  Colorectal Screening: Completed 07/23/2018 Repeat every 7 years  Lung Cancer Screening: (Low Dose CT Chest recommended if Age 61-80 years, 30 pack-year currently smoking OR have quit w/in 15years.) does not qualify.     Additional Screening:  Hepatitis C Screening: does qualify; Completed 08/03/2015  Vision Screening: Recommended annual ophthalmology exams for early detection of glaucoma and other disorders of the eye. Is the patient up to date with their annual eye exam?  Yes  Who is the provider or what is the name of the office in which the pt  attends  annual eye exams? [place] Screening: Recommended annual dental exams for proper oral hygiene  Community Resource Referral:  CRR required this visit?  No        Plan:  I have personally reviewed and addressed the Medicare Annual Wellness questionnaire and have noted the following in the patient's chart:  A. Medical and social history B. Use of alcohol, tobacco or illicit drugs  C. Current medications and supplements D. Functional ability and status E.  Nutritional status F.  Physical activity G. Advance directives H. List of other physicians I.  Hospitalizations, surgeries, and ER visits in previous 12 months J.  [place] such as hearing and vision if needed, cognitive and depression L. Referrals and appointments   In addition, I have reviewed and discussed with patient certain preventive protocols, quality metrics, and best practice recommendations. A written personalized care plan for preventive services as well as general preventive health recommendations were provided to patient.   Signed,    Bevelyn Ngo, LPN   09/14/621  Nurse Health Advisor   Nurse Notes: none

## 2019-01-05 ENCOUNTER — Ambulatory Visit (INDEPENDENT_AMBULATORY_CARE_PROVIDER_SITE_OTHER): Payer: Medicare Other | Admitting: Family Medicine

## 2019-01-05 ENCOUNTER — Encounter: Payer: Self-pay | Admitting: Family Medicine

## 2019-01-05 ENCOUNTER — Other Ambulatory Visit: Payer: Self-pay

## 2019-01-05 ENCOUNTER — Ambulatory Visit (INDEPENDENT_AMBULATORY_CARE_PROVIDER_SITE_OTHER): Payer: Medicare Other

## 2019-01-05 VITALS — BP 137/93 | HR 70 | Ht 69.0 in | Wt 242.0 lb

## 2019-01-05 DIAGNOSIS — E7800 Pure hypercholesterolemia, unspecified: Secondary | ICD-10-CM | POA: Diagnosis not present

## 2019-01-05 DIAGNOSIS — I10 Essential (primary) hypertension: Secondary | ICD-10-CM

## 2019-01-05 DIAGNOSIS — M06042 Rheumatoid arthritis without rheumatoid factor, left hand: Secondary | ICD-10-CM | POA: Diagnosis not present

## 2019-01-05 DIAGNOSIS — M06041 Rheumatoid arthritis without rheumatoid factor, right hand: Secondary | ICD-10-CM | POA: Diagnosis not present

## 2019-01-05 DIAGNOSIS — N400 Enlarged prostate without lower urinary tract symptoms: Secondary | ICD-10-CM

## 2019-01-05 DIAGNOSIS — Z0000 Encounter for general adult medical examination without abnormal findings: Secondary | ICD-10-CM | POA: Diagnosis not present

## 2019-01-05 DIAGNOSIS — Z7189 Other specified counseling: Secondary | ICD-10-CM | POA: Diagnosis not present

## 2019-01-05 DIAGNOSIS — D692 Other nonthrombocytopenic purpura: Secondary | ICD-10-CM

## 2019-01-05 MED ORDER — TELMISARTAN 80 MG PO TABS: 80.0000 mg | ORAL_TABLET | Freq: Every day | ORAL | 4 refills | Status: DC

## 2019-01-05 MED ORDER — ROSUVASTATIN CALCIUM 5 MG PO TABS: 5.0000 mg | ORAL_TABLET | Freq: Every day | ORAL | 4 refills | Status: DC

## 2019-01-05 NOTE — Assessment & Plan Note (Signed)
The current medical regimen is effective;  continue present plan and medications.  

## 2019-01-05 NOTE — Progress Notes (Signed)
BP (!) 137/95   Pulse 65    Subjective:    Patient ID: Paul Rodriguez, male    DOB: July 27, 1950, 68 y.o.   MRN: [id number]  HPI: Paul Rodriguez is a 68 y.o. male  Med check Discussed with patient all in all doing well followed by rheumatology for arthritis and doing okay. Not able to get losartan anymore is just started Micardis and blood pressures been elevated. Patient will settle down on Micardis and check blood pressure with next follow-up appointment.  If blood pressure not doing good in the meantime patient will go ahead and take 2 Micardis. Other medications stable but not taking cholesterol medicine.  Relevant past medical, surgical, family and social history reviewed and updated as indicated. Interim medical history since our last visit reviewed. Allergies and medications reviewed and updated.  Review of Systems  Constitutional: Negative.   HENT: Negative.   Eyes: Negative.   Respiratory: Negative.   Cardiovascular: Negative.   Gastrointestinal: Negative.   Endocrine: Negative.   Genitourinary: Negative.   Musculoskeletal: Negative.   Skin: Negative.   Allergic/Immunologic: Negative.   Neurological: Negative.   Hematological: Negative.   Psychiatric/Behavioral: Negative.     Per HPI unless specifically indicated above     Objective:    BP (!) 137/95   Pulse 65   Wt Readings from Last 3 Encounters:  01/05/19 242 lb (109.8 kg)  10/09/18 235 lb (106.6 kg)  07/23/18 229 lb 12.8 oz (104.2 kg)    Physical Exam  Results for orders placed or performed in visit on 10/10/18  Novel Coronavirus, NAA ([facility])  Result Value Ref Range   SARS-CoV-2, NAA Not Detected Not Detected      Assessment & Plan:   Problem List Items Addressed This Visit      Cardiovascular and Mediastinum   Essential hypertension    Discussed will recheck blood pressure with office visit in about a month virtually. Has finished losartan as that is not available anymore and on  Micardis.  Will check blood pressure in 1 month on Micardis if still elevated will need to make adjustments.       Relevant Medications   telmisartan (MICARDIS) 80 MG tablet   rosuvastatin (CRESTOR) 5 MG tablet   Other Relevant Orders   Comprehensive metabolic panel   TSH   Urinalysis, Routine w reflex microscopic   Purpura senilis (HCC)    The current medical regimen is effective;  continue present plan and medications.       Relevant Medications   telmisartan (MICARDIS) 80 MG tablet   rosuvastatin (CRESTOR) 5 MG tablet     Musculoskeletal and Integument   Rheumatoid arthritis (HCC)    The current medical regimen is effective;  continue present plan and medications.       Relevant Orders   Comprehensive metabolic panel   TSH     Genitourinary   BPH (benign prostatic hyperplasia)    The current medical regimen is effective;  continue present plan and medications.       Relevant Orders   PSA     Other   Hyperlipidemia    Discussed cholesterol patient not taking Crestor 5 mg has been off that about 6 months we will give refill on Crestor and check lipid panel.      Relevant Medications   telmisartan (MICARDIS) 80 MG tablet   rosuvastatin (CRESTOR) 5 MG tablet   Other Relevant Orders   Comprehensive metabolic panel   Lipid panel  Advanced care planning/counseling discussion    A voluntary discussion about advanced care planning including explanation and discussion of advanced directives was extentively discussed with the patient.  Explained about the healthcare proxy and living will was reviewed and packet with forms with expiration of how to fill them out was given.  Time spent: Encounter 16+ min individuals present: Patient          Telemedicine using audio/video telecommunications for a synchronous communication visit. Today's visit due to COVID-19 isolation precautions I connected with and verified that I am speaking with the correct person using two  identifiers.   I discussed the limitations, risks, security and privacy concerns of performing an evaluation and management service by telecommunication and the availability of in person appointments. I also discussed with the patient that there may be a patient responsible charge related to this service. The patient expressed understanding and agreed to proceed. The patient's location is home. I am at home.   I discussed the assessment and treatment plan with the patient. The patient was provided an opportunity to ask questions and all were answered. The patient agreed with the plan and demonstrated an understanding of the instructions.   The patient was advised to call back or seek an in-person evaluation if the symptoms worsen or if the condition fails to improve as anticipated.   I provided 21+ minutes of time during this encounter. Follow up plan: Return in about 4 weeks (around 02/02/2019) for Physical Exam.

## 2019-01-05 NOTE — Patient Instructions (Addendum)
Paul Rodriguez , Thank you for taking time to come for your Medicare Wellness Visit. I appreciate your ongoing commitment to your health goals. Please review the following plan we discussed and let me know if I can assist you in the future.   Screening recommendations/referrals: Colonoscopy: completed 07/23/2018, due 7 years Recommended yearly ophthalmology/optometry visit for glaucoma screening and checkup Recommended yearly dental visit for hygiene and checkup  Vaccinations: Influenza vaccine: due 01/2019 Pneumococcal vaccine: pneumovax 23 due 02/27/2019 Tdap vaccine: up to date Shingles vaccine: shingrix eligible, check with your insurance for coverage    Advanced directives: Please bring a copy of your health care power of attorney and living will to the office at your convenience.  Conditions/risks identified: discussed increasing exercise and health eating habits at todays visit.   Next appointment: Follow up in one year for your annual wellness visit.   Preventive Care 67 Years and Older, Male Preventive care refers to lifestyle choices and visits with your health care provider that can promote health and wellness. What does preventive care include?  A yearly physical exam. This is also called an annual well check.  Dental exams once or twice a year.  Routine eye exams. Ask your health care provider how often you should have your eyes checked.  Personal lifestyle choices, including:  Daily care of your teeth and gums.  Regular physical activity.  Eating a healthy diet.  Avoiding tobacco and drug use.  Limiting alcohol use.  Practicing safe sex.  Taking low doses of aspirin every day.  Taking vitamin and mineral supplements as recommended by your health care provider. What happens during an annual well check? The services and screenings done by your health care provider during your annual well check will depend on your age, overall health, lifestyle risk factors, and  family history of disease. Counseling  Your health care provider may ask you questions about your:  Alcohol use.  Tobacco use.  Drug use.  Emotional well-being.  Home and relationship well-being.  Sexual activity.  Eating habits.  History of falls.  Memory and ability to understand (cognition).  Work and work Statistician. Screening  You may have the following tests or measurements:  Height, weight, and BMI.  Blood pressure.  Lipid and cholesterol levels. These may be checked every 5 years, or more frequently if you are over 67 years old.  Skin check.  Lung cancer screening. You may have this screening every year starting at age 67 if you have a 30-pack-year history of smoking and currently smoke or have quit within the past 15 years.  Fecal occult blood test (FOBT) of the stool. You may have this test every year starting at age 67.  Flexible sigmoidoscopy or colonoscopy. You may have a sigmoidoscopy every 5 years or a colonoscopy every 10 years starting at age 57.  Prostate cancer screening. Recommendations will vary depending on your family history and other risks.  Hepatitis C blood test.  Hepatitis B blood test.  Sexually transmitted disease (STD) testing.  Diabetes screening. This is done by checking your blood sugar (glucose) after you have not eaten for a while (fasting). You may have this done every 1-3 years.  Abdominal aortic aneurysm (AAA) screening. You may need this if you are a current or former smoker.  Osteoporosis. You may be screened starting at age 67 if you are at high risk. Talk with your health care provider about your test results, treatment options, and if necessary, the need for more tests.  Vaccines  Your health care provider may recommend certain vaccines, such as:  Influenza vaccine. This is recommended every year.  Tetanus, diphtheria, and acellular pertussis (Tdap, Td) vaccine. You may need a Td booster every 10 years.  Zoster  vaccine. You may need this after age 67.  Pneumococcal 13-valent conjugate (PCV13) vaccine. One dose is recommended after age 67.  Pneumococcal polysaccharide (PPSV23) vaccine. One dose is recommended after age 67. Talk to your health care provider about which screenings and vaccines you need and how often you need them. This information is not intended to replace advice given to you by your health care provider. Make sure you discuss any questions you have with your health care provider. Document Released: 06/03/2015 Document Revised: 01/25/2016 Document Reviewed: 03/08/2015 Elsevier Interactive Patient Education  2017 [place] Prevention in the Home Falls can cause injuries. They can happen to people of all ages. There are many things you can do to make your home safe and to help prevent falls. What can I do on the outside of my home?  Regularly fix the edges of walkways and driveways and fix any cracks.  Remove anything that might make you trip as you walk through a door, such as a raised step or threshold.  Trim any bushes or trees on the path to your home.  Use bright outdoor lighting.  Clear any walking paths of anything that might make someone trip, such as rocks or tools.  Regularly check to see if handrails are loose or broken. Make sure that both sides of any steps have handrails.  Any raised decks and porches should have guardrails on the edges.  Have any leaves, snow, or ice cleared regularly.  Use sand or salt on walking paths during winter.  Clean up any spills in your garage right away. This includes oil or grease spills. What can I do in the bathroom?  Use night lights.  Install grab bars by the toilet and in the tub and shower. Do not use towel bars as grab bars.  Use non-skid mats or decals in the tub or shower.  If you need to sit down in the shower, use a plastic, non-slip stool.  Keep the floor dry. Clean up any water that spills on the  floor as soon as it happens.  Remove soap buildup in the tub or shower regularly.  Attach bath mats securely with double-sided non-slip rug tape.  Do not have throw rugs and other things on the floor that can make you trip. What can I do in the bedroom?  Use night lights.  Make sure that you have a light by your bed that is easy to reach.  Do not use any sheets or blankets that are too big for your bed. They should not hang down onto the floor.  Have a firm chair that has side arms. You can use this for support while you get dressed.  Do not have throw rugs and other things on the floor that can make you trip. What can I do in the kitchen?  Clean up any spills right away.  Avoid walking on wet floors.  Keep items that you use a lot in easy-to-reach places.  If you need to reach something above you, use a strong step stool that has a grab bar.  Keep electrical cords out of the way.  Do not use floor polish or wax that makes floors slippery. If you must use wax, use non-skid floor wax.  Do not have throw rugs and other things on the floor that can make you trip. What can I do with my stairs?  Do not leave any items on the stairs.  Make sure that there are handrails on both sides of the stairs and use them. Fix handrails that are broken or loose. Make sure that handrails are as long as the stairways.  Check any carpeting to make sure that it is firmly attached to the stairs. Fix any carpet that is loose or worn.  Avoid having throw rugs at the top or bottom of the stairs. If you do have throw rugs, attach them to the floor with carpet tape.  Make sure that you have a light switch at the top of the stairs and the bottom of the stairs. If you do not have them, ask someone to add them for you. What else can I do to help prevent falls?  Wear shoes that:  Do not have high heels.  Have rubber bottoms.  Are comfortable and fit you well.  Are closed at the toe. Do not wear  sandals.  If you use a stepladder:  Make sure that it is fully opened. Do not climb a closed stepladder.  Make sure that both sides of the stepladder are locked into place.  Ask someone to hold it for you, if possible.  Clearly mark and make sure that you can see:  Any grab bars or handrails.  First and last steps.  Where the edge of each step is.  Use tools that help you move around (mobility aids) if they are needed. These include:  Canes.  Walkers.  Scooters.  Crutches.  Turn on the lights when you go into a dark area. Replace any light bulbs as soon as they burn out.  Set up your furniture so you have a clear path. Avoid moving your furniture around.  If any of your floors are uneven, fix them.  If there are any pets around you, be aware of where they are.  Review your medicines with your doctor. Some medicines can make you feel dizzy. This can increase your chance of falling. Ask your doctor what other things that you can do to help prevent falls. This information is not intended to replace advice given to you by your health care provider. Make sure you discuss any questions you have with your health care provider. Document Released: 03/03/2009 Document Revised: 10/13/2015 Document Reviewed: 06/11/2014 Elsevier Interactive Patient Education  2017 [employer].

## 2019-01-05 NOTE — Assessment & Plan Note (Signed)
A voluntary discussion about advanced care planning including explanation and discussion of advanced directives was extentively discussed with the patient.  Explained about the healthcare proxy and living will was reviewed and packet with forms with expiration of how to fill them out was given.  Time spent: Encounter 16+ min individuals present: Patient 

## 2019-01-05 NOTE — Assessment & Plan Note (Signed)
Discussed cholesterol patient not taking Crestor 5 mg has been off that about 6 months we will give refill on Crestor and check lipid panel.

## 2019-01-05 NOTE — Assessment & Plan Note (Addendum)
Discussed will recheck blood pressure with office visit in about a month virtually. Has finished losartan as that is not available anymore and on Micardis.  Will check blood pressure in 1 month on Micardis if still elevated will need to make adjustments.

## 2019-01-21 DIAGNOSIS — H43813 Vitreous degeneration, bilateral: Secondary | ICD-10-CM | POA: Diagnosis not present

## 2019-01-21 DIAGNOSIS — M3501 Sicca syndrome with keratoconjunctivitis: Secondary | ICD-10-CM | POA: Diagnosis not present

## 2019-02-02 ENCOUNTER — Other Ambulatory Visit: Payer: Self-pay

## 2019-02-02 ENCOUNTER — Encounter: Payer: Self-pay | Admitting: Nurse Practitioner

## 2019-02-02 ENCOUNTER — Ambulatory Visit (INDEPENDENT_AMBULATORY_CARE_PROVIDER_SITE_OTHER): Payer: Medicare Other | Admitting: Nurse Practitioner

## 2019-02-02 VITALS — BP 133/85 | HR 79 | Temp 98.3°F | Ht 69.3 in | Wt 233.8 lb

## 2019-02-02 DIAGNOSIS — Z23 Encounter for immunization: Secondary | ICD-10-CM

## 2019-02-02 DIAGNOSIS — Z0000 Encounter for general adult medical examination without abnormal findings: Secondary | ICD-10-CM

## 2019-02-02 DIAGNOSIS — N400 Enlarged prostate without lower urinary tract symptoms: Secondary | ICD-10-CM

## 2019-02-02 DIAGNOSIS — M06041 Rheumatoid arthritis without rheumatoid factor, right hand: Secondary | ICD-10-CM | POA: Diagnosis not present

## 2019-02-02 DIAGNOSIS — E7800 Pure hypercholesterolemia, unspecified: Secondary | ICD-10-CM

## 2019-02-02 DIAGNOSIS — M06042 Rheumatoid arthritis without rheumatoid factor, left hand: Secondary | ICD-10-CM | POA: Diagnosis not present

## 2019-02-02 DIAGNOSIS — I10 Essential (primary) hypertension: Secondary | ICD-10-CM

## 2019-02-02 LAB — URINALYSIS, ROUTINE W REFLEX MICROSCOPIC
Bilirubin, UA: NEGATIVE
Glucose, UA: NEGATIVE
Ketones, UA: NEGATIVE
Leukocytes,UA: NEGATIVE
Nitrite, UA: NEGATIVE
Protein,UA: NEGATIVE
RBC, UA: NEGATIVE
Specific Gravity, UA: 1.015 (ref 1.005–1.030)
Urobilinogen, Ur: 0.2 mg/dL (ref 0.2–1.0)
pH, UA: 5 (ref 5.0–7.5)

## 2019-02-02 MED ORDER — LOSARTAN POTASSIUM 100 MG PO TABS: 100.0000 mg | ORAL_TABLET | Freq: Every day | ORAL | 3 refills | Status: DC

## 2019-02-02 NOTE — Assessment & Plan Note (Signed)
Chronic, ongoing.  Followed by rheumatology.  Continue current medication regimen and collaboration.

## 2019-02-02 NOTE — Assessment & Plan Note (Signed)
Chronic, ongoing.  Some elevations at home reported, states better control with his Losartan.  Will print script for him today for Losartan 100 MG, which he can obtain at pharmacy of choice.  Discontinue Micardis.  Continue to check BP daily.  Return in 6 months.

## 2019-02-02 NOTE — Assessment & Plan Note (Signed)
Chronic, stable without medication.  Labs today.

## 2019-02-02 NOTE — Patient Instructions (Signed)

## 2019-02-02 NOTE — Progress Notes (Signed)
BP 133/85   Pulse 79   Temp 98.3 F (36.8 C) (Oral)   Ht 5' 9.3" (1.76 m)   Wt 233 lb 12.8 oz (106.1 kg)   SpO2 97%   BMI 34.23 kg/m    Subjective:    Patient ID: Paul Rodriguez, male    DOB: 1950/06/24, 68 y.o.   MRN: [id number]  HPI: Paul Rodriguez is a 68 y.o. male presenting on 02/02/2019 for comprehensive medical examination. Current medical complaints include:none  He currently lives with: wife Interim Problems from his last visit: no   HYPERTENSION / HYPERLIPIDEMIA Would like to go back to Losartan, feels Micardis is not working.  Had to switch as [facility] did not have Losartan.  Wishes to return to Losartan and wants printed script so he can take it elsewhere for filling.  Continues on ASA, was taking Crestor but has stopped taking this.  Was started on statin by cardiology, was switched to Crestor by PCP.  Took it for one year and then was evaluated for RA by Dr. Jefm Bryant, who instructed him to stop Crestor at time to evaluate pain.  Restarted it one month ago by PCP, but then pain started to present again.  He stopped taking 2 weeks ago and pain improved.   Satisfied with current treatment? no Duration of hypertension: chronic BP monitoring frequency: daily BP range: 130-140/80 BP medication side effects: no Duration of hyperlipidemia: chronic Cholesterol medication side effects: no Cholesterol supplements: none Medication compliance: good compliance Aspirin: yes Recent stressors: no Recurrent headaches: no Visual changes: no Palpitations: no Dyspnea: no Chest pain: no Lower extremity edema: no Dizzy/lightheaded: no   RHEUMATOID ARTHRITIS: Followed by Dr. Jefm Bryant and continues on Methotrexate weekly.  Has started on diet that reduces sugar, no fried food, no red meat.  Has lost 8 pounds.  Reports improvement in pain.    Functional Status Survey: Is the patient deaf or have difficulty hearing?: No Does the patient have difficulty seeing, even when wearing  glasses/contacts?: No Does the patient have difficulty concentrating, remembering, or making decisions?: No Does the patient have difficulty walking or climbing stairs?: No Does the patient have difficulty dressing or bathing?: No Does the patient have difficulty doing errands alone such as visiting a doctor's office or shopping?: No  FALL RISK: Fall Risk  01/05/2019 02/14/2017 08/13/2016 08/03/2015  Falls in the past year? 0 No No No    Depression Screen Depression screen [facility] 2/9 01/05/2019 02/14/2017 08/13/2016 08/03/2015  Decreased Interest 0 0 0 0  Down, Depressed, Hopeless 0 0 0 0  PHQ - 2 Score 0 0 0 0    Advanced Directives <no information>  Past Medical History:  Past Medical History:  Diagnosis Date  . Allergy   . Arthritis    Rheumatoid arthritis  . Depression   . GERD (gastroesophageal reflux disease)   . History of kidney stones   . Hyperlipidemia   . Hypertension     Surgical History:  Past Surgical History:  Procedure Laterality Date  . COLONOSCOPY WITH PROPOFOL N/A 07/23/2018   Procedure: COLONOSCOPY WITH PROPOFOL;  Surgeon: Lin Landsman, MD;  Location: [facility];  Service: Endoscopy;  Laterality: N/A;  Requests early  . EYE SURGERY  Lasik  . HIP SURGERY Right 2014   arthroscopic  . HIP SURGERY Left Nov 2014   arthroscopic  . KNEE SURGERY Left Nov 2014  . NOSE SURGERY    . POLYPECTOMY  07/23/2018   Procedure: POLYPECTOMY;  Surgeon: Lin Landsman, MD;  Location: [facility];  Service: Endoscopy;;    Medications:  Current Outpatient Medications on File Prior to Visit  Medication Sig  . aspirin 81 MG tablet Take 81 mg by mouth daily.  Marland Kitchen EPINEPHrine (EPIPEN 2-PAK) 0.3 mg/0.3 mL IJ SOAJ injection   . FOLIC ACID PO Take by mouth daily.  Marland Kitchen ibuprofen (ADVIL,MOTRIN) 200 MG tablet Take 400 mg by mouth daily.  Marland Kitchen loratadine-pseudoephedrine (CLARITIN-D 12-HOUR) 5-120 MG tablet Take by mouth every 12 (twelve) hours as needed.  . meloxicam  (MOBIC) 7.5 MG tablet Take by mouth.  . methotrexate (RHEUMATREX) 2.5 MG tablet Take 15 mg by mouth once a week.   . montelukast (SINGULAIR) 10 MG tablet Take 1 tablet (10 mg total) by mouth at bedtime.  . Multiple Vitamin (MULTIVITAMIN) tablet Take 1 tablet by mouth daily.  . tadalafil (CIALIS) 5 MG tablet Take 1 tablet (5 mg total) by mouth daily as needed for erectile dysfunction.  . rosuvastatin (CRESTOR) 5 MG tablet Take 1 tablet (5 mg total) by mouth daily. (Patient not taking: Reported on 02/02/2019)   No current facility-administered medications on file prior to visit.     Allergies:  Allergies  Allergen Reactions  . Ultram [Tramadol] Anaphylaxis    Social History:  Social History   Socioeconomic History  . Marital status: Married    Spouse name: Not on file  . Number of children: Not on file  . Years of education: Not on file  . Highest education level: Associate degree: academic program  Occupational History  . Occupation: retired   Scientific laboratory technician  . Financial resource strain: Not hard at all  . Food insecurity    Worry: Never true    Inability: Never true  . Transportation needs    Medical: No    Non-medical: No  Tobacco Use  . Smoking status: Former Smoker    Packs/day: 1.00    Years: 10.00    Pack years: 10.00    Types: Cigarettes    Quit date: 05/22/1991    Years since quitting: 27.7  . Smokeless tobacco: Never Used  Substance and Sexual Activity  . Alcohol use: Yes    Alcohol/week: 2.0 standard drinks    Types: 2 Cans of beer per week    Comment: on occasion/socially  . Drug use: No  . Sexual activity: Yes    Birth control/protection: None  Lifestyle  . Physical activity    Days per week: 0 days    Minutes per session: 0 min  . Stress: Not at all  Relationships  . Social connections    Talks on phone: More than three times a week    Gets together: More than three times a week    Attends religious service: 1 to 4 times per year    Active member  of club or organization: No    Attends meetings of clubs or organizations: Never    Relationship status: Married  . Intimate partner violence    Fear of current or ex partner: No    Emotionally abused: No    Physically abused: No    Forced sexual activity: No  Other Topics Concern  . Not on file  Social History Narrative   Stays active around the house   Social History   Tobacco Use  Smoking Status Former Smoker  . Packs/day: 1.00  . Years: 10.00  . Pack years: 10.00  . Types: Cigarettes  . Quit date: 05/22/1991  .  Years since quitting: 27.7  Smokeless Tobacco Never Used   Social History   Substance and Sexual Activity  Alcohol Use Yes  . Alcohol/week: 2.0 standard drinks  . Types: 2 Cans of beer per week   Comment: on occasion/socially    Family History:  Family History  Problem Relation Age of Onset  . Hypertension Mother   . Hypertension Father   . Heart disease Father   . Stroke Father   . Diabetes Maternal Grandfather   . Heart disease Maternal Grandmother   . Cancer Paternal Grandmother   . Cancer Paternal Grandfather     Past medical history, surgical history, medications, allergies, family history and social history reviewed with patient today and changes made to appropriate areas of the chart.   Review of Systems - negative All other ROS negative except what is listed above and in the HPI.      Objective:    BP 133/85   Pulse 79   Temp 98.3 F (36.8 C) (Oral)   Ht 5' 9.3" (1.76 m)   Wt 233 lb 12.8 oz (106.1 kg)   SpO2 97%   BMI 34.23 kg/m   Wt Readings from Last 3 Encounters:  02/02/19 233 lb 12.8 oz (106.1 kg)  01/05/19 242 lb (109.8 kg)  10/09/18 235 lb (106.6 kg)    Physical Exam Vitals signs and nursing note reviewed.  Constitutional:      General: He is awake. He is not in acute distress.    Appearance: He is well-developed. He is obese. He is not ill-appearing.  HENT:     Head: Normocephalic and atraumatic.     Right Ear:  Hearing, tympanic membrane, ear canal and external ear normal. No drainage.     Left Ear: Hearing, tympanic membrane, ear canal and external ear normal. No drainage.     Nose: Nose normal.     Mouth/Throat:     Mouth: Mucous membranes are moist.     Pharynx: Uvula midline.  Eyes:     General: Lids are normal.        Right eye: No discharge.        Left eye: No discharge.     Extraocular Movements: Extraocular movements intact.     Conjunctiva/sclera: Conjunctivae normal.     Pupils: Pupils are equal, round, and reactive to light.     Visual Fields: Right eye visual fields normal and left eye visual fields normal.  Neck:     Musculoskeletal: Normal range of motion and neck supple.     Thyroid: No thyromegaly.     Vascular: No carotid bruit.  Cardiovascular:     Rate and Rhythm: Normal rate and regular rhythm.     Heart sounds: Normal heart sounds, S1 normal and S2 normal. No murmur. No gallop.   Pulmonary:     Effort: Pulmonary effort is normal. No accessory muscle usage or respiratory distress.     Breath sounds: Normal breath sounds.  Abdominal:     General: Bowel sounds are normal.     Palpations: Abdomen is soft. There is no hepatomegaly or splenomegaly.     Tenderness: There is no abdominal tenderness.  Musculoskeletal: Normal range of motion.     Right lower leg: No edema.     Left lower leg: No edema.  Skin:    General: Skin is warm and dry.     Capillary Refill: Capillary refill takes less than 2 seconds.     Findings: No rash.  Neurological:     Mental Status: He is alert and oriented to person, place, and time.     Cranial Nerves: Cranial nerves are intact.     Gait: Gait is intact.     Deep Tendon Reflexes: Reflexes are normal and symmetric.     Reflex Scores:      Brachioradialis reflexes are 2+ on the right side and 2+ on the left side.      Patellar reflexes are 2+ on the right side and 2+ on the left side. Psychiatric:        Attention and Perception:  Attention normal.        Mood and Affect: Mood normal.        Speech: Speech normal.        Behavior: Behavior normal. Behavior is cooperative.        Thought Content: Thought content normal.        Judgment: Judgment normal.    6CIT Screen 02/02/2019  What Year? 0 points  What month? 0 points  What time? 0 points  Count back from 20 0 points  Months in reverse 0 points  Repeat phrase 0 points  Total Score 0    Results for orders placed or performed in visit on 02/02/19  Urinalysis, Routine w reflex microscopic  Result Value Ref Range   Specific Gravity, UA 1.015 1.005 - 1.030   pH, UA 5.0 5.0 - 7.5   Color, UA Yellow Yellow   Appearance Ur Clear Clear   Leukocytes,UA Negative Negative   Protein,UA Negative Negative/Trace   Glucose, UA Negative Negative   Ketones, UA Negative Negative   RBC, UA Negative Negative   Bilirubin, UA Negative Negative   Urobilinogen, Ur 0.2 0.2 - 1.0 mg/dL   Nitrite, UA Negative Negative      Assessment & Plan:   Problem List Items Addressed This Visit      Cardiovascular and Mediastinum   Essential hypertension    Chronic, ongoing.  Some elevations at home reported, states better control with his Losartan.  Will print script for him today for Losartan 100 MG, which he can obtain at pharmacy of choice.  Discontinue Micardis.  Continue to check BP daily.  Return in 6 months.      Relevant Medications   losartan (COZAAR) 100 MG tablet     Musculoskeletal and Integument   Rheumatoid arthritis (HCC)    Chronic, ongoing.  Followed by rheumatology.  Continue current medication regimen and collaboration.        Genitourinary   BPH (benign prostatic hyperplasia)    Chronic, stable without medication.  Labs today.        Other   Hyperlipidemia    Chronic, ongoing.  Continue current medication regimen and adjust as needed.  Labs today.      Relevant Medications   losartan (COZAAR) 100 MG tablet    Other Visit Diagnoses    Annual  physical exam    -  Primary   Need for pneumococcal vaccination       Need for influenza vaccination       Relevant Orders   Flu Vaccine QUAD High Dose(Fluad) (Completed)       Discussed aspirin prophylaxis for myocardial infarction prevention and decision was made to continue ASA  LABORATORY TESTING:  Health maintenance labs ordered today as discussed above.   The natural history of prostate cancer and ongoing controversy regarding screening and potential treatment outcomes of prostate cancer has been discussed with  the patient. The meaning of a false positive PSA and a false negative PSA has been discussed. He indicates understanding of the limitations of this screening test and wishes to proceed with screening PSA testing.   IMMUNIZATIONS:   - Tdap: Tetanus vaccination status reviewed: last tetanus booster within 10 years. - Influenza: Up to date - Pneumovax: Up to date - Prevnar: Up to date - Zostavax vaccine: wishes to think about it  SCREENING: - Colonoscopy: Up to date  Discussed with patient purpose of the colonoscopy is to detect colon cancer at curable precancerous or early stages   - AAA Screening: Not applicable  -Hearing Test: Not applicable  -Spirometry: Not applicable   PATIENT COUNSELING:    Sexuality: Discussed sexually transmitted diseases, partner selection, use of condoms, avoidance of unintended pregnancy  and contraceptive alternatives.   Advised to avoid cigarette smoking.  I discussed with the patient that most people either abstain from alcohol or drink within safe limits (<=14/week and <=4 drinks/occasion for males, <=7/weeks and <= 3 drinks/occasion for females) and that the risk for alcohol disorders and other health effects rises proportionally with the number of drinks per week and how often a drinker exceeds daily limits.  Discussed cessation/primary prevention of drug use and availability of treatment for abuse.   Diet: Encouraged to adjust  caloric intake to maintain  or achieve ideal body weight, to reduce intake of dietary saturated fat and total fat, to limit sodium intake by avoiding high sodium foods and not adding table salt, and to maintain adequate dietary potassium and calcium preferably from fresh fruits, vegetables, and low-fat dairy products.    stressed the importance of regular exercise  Injury prevention: Discussed safety belts, safety helmets, smoke detector, smoking near bedding or upholstery.   Dental health: Discussed importance of regular tooth brushing, flossing, and dental visits.   Follow up plan: NEXT PREVENTATIVE PHYSICAL DUE IN 1 YEAR. Return in about 6 months (around 08/02/2019) for chronic disease follow-up.

## 2019-02-02 NOTE — Assessment & Plan Note (Signed)
Chronic, ongoing.  Continue current medication regimen and adjust as needed.  Labs today. 

## 2019-02-03 ENCOUNTER — Ambulatory Visit: Payer: Medicare Other | Admitting: Family Medicine

## 2019-02-03 ENCOUNTER — Encounter: Payer: Self-pay | Admitting: Family Medicine

## 2019-02-03 LAB — LIPID PANEL
Chol/HDL Ratio: 3.6 ratio (ref 0.0–5.0)
Cholesterol, Total: 152 mg/dL (ref 100–199)
HDL: 42 mg/dL (ref >39)
LDL Chol Calc (NIH): 85 mg/dL (ref 0–99)
Triglycerides: 140 mg/dL (ref 0–149)
VLDL Cholesterol Cal: 25 mg/dL (ref 5–40)

## 2019-02-03 LAB — COMPREHENSIVE METABOLIC PANEL
ALT: 34 IU/L (ref 0–44)
AST: 30 IU/L (ref 0–40)
Albumin/Globulin Ratio: 2.3 — ABNORMAL HIGH (ref 1.2–2.2)
Albumin: 4.8 g/dL (ref 3.8–4.8)
Alkaline Phosphatase: 79 IU/L (ref 39–117)
BUN/Creatinine Ratio: 19 (ref 10–24)
BUN: 25 mg/dL (ref 8–27)
Bilirubin Total: 0.9 mg/dL (ref 0.0–1.2)
CO2: 18 mmol/L — ABNORMAL LOW (ref 20–29)
Calcium: 9.3 mg/dL (ref 8.6–10.2)
Chloride: 102 mmol/L (ref 96–106)
Creatinine, Ser: 1.31 mg/dL — ABNORMAL HIGH (ref 0.76–1.27)
GFR calc Af Amer: 65 mL/min/{1.73_m2} (ref >59)
GFR calc non Af Amer: 56 mL/min/{1.73_m2} — ABNORMAL LOW (ref >59)
Globulin, Total: 2.1 g/dL (ref 1.5–4.5)
Glucose: 103 mg/dL — ABNORMAL HIGH (ref 65–99)
Potassium: 4.5 mmol/L (ref 3.5–5.2)
Sodium: 136 mmol/L (ref 134–144)
Total Protein: 6.9 g/dL (ref 6.0–8.5)

## 2019-02-03 LAB — PSA: Prostate Specific Ag, Serum: 0.8 ng/mL (ref 0.0–4.0)

## 2019-02-03 LAB — TSH: TSH: 2.61 u[IU]/mL (ref 0.450–4.500)

## 2019-02-10 ENCOUNTER — Encounter: Payer: Self-pay | Admitting: Family Medicine

## 2019-02-10 ENCOUNTER — Other Ambulatory Visit: Payer: Self-pay

## 2019-02-10 ENCOUNTER — Ambulatory Visit (INDEPENDENT_AMBULATORY_CARE_PROVIDER_SITE_OTHER): Payer: Medicare Other | Admitting: Family Medicine

## 2019-02-10 ENCOUNTER — Other Ambulatory Visit: Payer: Self-pay | Admitting: *Deleted

## 2019-02-10 DIAGNOSIS — J329 Chronic sinusitis, unspecified: Secondary | ICD-10-CM | POA: Diagnosis not present

## 2019-02-10 DIAGNOSIS — M06042 Rheumatoid arthritis without rheumatoid factor, left hand: Secondary | ICD-10-CM | POA: Diagnosis not present

## 2019-02-10 DIAGNOSIS — I10 Essential (primary) hypertension: Secondary | ICD-10-CM | POA: Diagnosis not present

## 2019-02-10 DIAGNOSIS — M06041 Rheumatoid arthritis without rheumatoid factor, right hand: Secondary | ICD-10-CM

## 2019-02-10 DIAGNOSIS — Z20822 Contact with and (suspected) exposure to covid-19: Secondary | ICD-10-CM

## 2019-02-10 DIAGNOSIS — R6889 Other general symptoms and signs: Secondary | ICD-10-CM | POA: Diagnosis not present

## 2019-02-10 MED ORDER — AZITHROMYCIN 250 MG PO TABS: ORAL_TABLET | ORAL | 0 refills | Status: DC

## 2019-02-10 NOTE — Assessment & Plan Note (Signed)
The current medical regimen is effective;  continue present plan and medications.  

## 2019-02-10 NOTE — Assessment & Plan Note (Signed)
Discussed sinusitis care and treatment will give a Z-Pak to hold start taking if getting worse discussed OTC medications

## 2019-02-10 NOTE — Progress Notes (Signed)
There were no vitals taken for this visit.   Subjective:    Patient ID: Paul Rodriguez, male    DOB: 11-30-1950, 68 y.o.   MRN: [id number]  HPI: Paul Rodriguez is a 68 y.o. male  Sick check Patient with 4 to 5 days of getting sicker with marked sore throat nasal congestion facial pressure especially bending and sloshing type motion.  Feels bad all over no known tick exposure no fever but constitutional symptoms of fatigue and generalized malaise. Discussed COVID-19 no fever. Other medical issues are stable.   Relevant past medical, surgical, family and social history reviewed and updated as indicated. Interim medical history since our last visit reviewed. Allergies and medications reviewed and updated.  Review of Systems  Constitutional: Positive for chills, diaphoresis and fatigue. Negative for fever.  HENT: Positive for congestion, postnasal drip, rhinorrhea, sinus pressure, sinus pain, sneezing and sore throat.   Respiratory: Positive for cough. Negative for shortness of breath.   Cardiovascular: Negative.     Per HPI unless specifically indicated above     Objective:    There were no vitals taken for this visit.  Wt Readings from Last 3 Encounters:  02/02/19 233 lb 12.8 oz (106.1 kg)  01/05/19 242 lb (109.8 kg)  10/09/18 235 lb (106.6 kg)    Physical Exam  Results for orders placed or performed in visit on 02/02/19  PSA  Result Value Ref Range   Prostate Specific Ag, Serum 0.8 0.0 - 4.0 ng/mL  Urinalysis, Routine w reflex microscopic  Result Value Ref Range   Specific Gravity, UA 1.015 1.005 - 1.030   pH, UA 5.0 5.0 - 7.5   Color, UA Yellow Yellow   Appearance Ur Clear Clear   Leukocytes,UA Negative Negative   Protein,UA Negative Negative/Trace   Glucose, UA Negative Negative   Ketones, UA Negative Negative   RBC, UA Negative Negative   Bilirubin, UA Negative Negative   Urobilinogen, Ur 0.2 0.2 - 1.0 mg/dL   Nitrite, UA Negative Negative  TSH  Result  Value Ref Range   TSH 2.610 0.450 - 4.500 uIU/mL  Lipid panel  Result Value Ref Range   Cholesterol, Total 152 100 - 199 mg/dL   Triglycerides 140 0 - 149 mg/dL   HDL 42 >39 mg/dL   VLDL Cholesterol Cal 25 5 - 40 mg/dL   LDL Chol Calc (NIH) 85 0 - 99 mg/dL   Chol/HDL Ratio 3.6 0.0 - 5.0 ratio  Comprehensive metabolic panel  Result Value Ref Range   Glucose 103 (H) 65 - 99 mg/dL   BUN 25 8 - 27 mg/dL   Creatinine, Ser 1.31 (H) 0.76 - 1.27 mg/dL   GFR calc non Af Amer 56 (L) >59 mL/min/1.73   GFR calc Af Amer 65 >59 mL/min/1.73   BUN/Creatinine Ratio 19 10 - 24   Sodium 136 134 - 144 mmol/L   Potassium 4.5 3.5 - 5.2 mmol/L   Chloride 102 96 - 106 mmol/L   CO2 18 (L) 20 - 29 mmol/L   Calcium 9.3 8.6 - 10.2 mg/dL   Total Protein 6.9 6.0 - 8.5 g/dL   Albumin 4.8 3.8 - 4.8 g/dL   Globulin, Total 2.1 1.5 - 4.5 g/dL   Albumin/Globulin Ratio 2.3 (H) 1.2 - 2.2   Bilirubin Total 0.9 0.0 - 1.2 mg/dL   Alkaline Phosphatase 79 39 - 117 IU/L   AST 30 0 - 40 IU/L   ALT 34 0 - 44 IU/L  Assessment & Plan:   Problem List Items Addressed This Visit      Cardiovascular and Mediastinum   Essential hypertension    The current medical regimen is effective;  continue present plan and medications.         Respiratory   Sinusitis    Discussed sinusitis care and treatment will give a Z-Pak to hold start taking if getting worse discussed OTC medications      Relevant Medications   azithromycin (ZITHROMAX) 250 MG tablet     Musculoskeletal and Integument   Rheumatoid arthritis ([place])    The current medical regimen is effective;  continue present plan and medications.           Follow up plan: Return if symptoms worsen or fail to improve.

## 2019-02-11 LAB — NOVEL CORONAVIRUS, NAA: SARS-CoV-2, NAA: NOT DETECTED

## 2019-02-23 DIAGNOSIS — M353 Polymyalgia rheumatica: Secondary | ICD-10-CM | POA: Diagnosis not present

## 2019-02-23 DIAGNOSIS — M791 Myalgia, unspecified site: Secondary | ICD-10-CM | POA: Diagnosis not present

## 2019-02-23 DIAGNOSIS — M1990 Unspecified osteoarthritis, unspecified site: Secondary | ICD-10-CM | POA: Diagnosis not present

## 2019-02-23 DIAGNOSIS — H903 Sensorineural hearing loss, bilateral: Secondary | ICD-10-CM | POA: Diagnosis not present

## 2019-02-23 DIAGNOSIS — M8949 Other hypertrophic osteoarthropathy, multiple sites: Secondary | ICD-10-CM | POA: Diagnosis not present

## 2019-04-03 ENCOUNTER — Other Ambulatory Visit: Payer: Self-pay | Admitting: Family Medicine

## 2019-04-29 DIAGNOSIS — M5416 Radiculopathy, lumbar region: Secondary | ICD-10-CM | POA: Diagnosis not present

## 2019-04-29 DIAGNOSIS — M5136 Other intervertebral disc degeneration, lumbar region: Secondary | ICD-10-CM | POA: Diagnosis not present

## 2019-04-29 DIAGNOSIS — M9901 Segmental and somatic dysfunction of cervical region: Secondary | ICD-10-CM | POA: Diagnosis not present

## 2019-04-29 DIAGNOSIS — M9903 Segmental and somatic dysfunction of lumbar region: Secondary | ICD-10-CM | POA: Diagnosis not present

## 2019-05-01 DIAGNOSIS — M5136 Other intervertebral disc degeneration, lumbar region: Secondary | ICD-10-CM | POA: Diagnosis not present

## 2019-05-01 DIAGNOSIS — M9901 Segmental and somatic dysfunction of cervical region: Secondary | ICD-10-CM | POA: Diagnosis not present

## 2019-05-01 DIAGNOSIS — M9903 Segmental and somatic dysfunction of lumbar region: Secondary | ICD-10-CM | POA: Diagnosis not present

## 2019-05-01 DIAGNOSIS — M5416 Radiculopathy, lumbar region: Secondary | ICD-10-CM | POA: Diagnosis not present

## 2019-06-03 DIAGNOSIS — M5416 Radiculopathy, lumbar region: Secondary | ICD-10-CM | POA: Diagnosis not present

## 2019-06-03 DIAGNOSIS — M9901 Segmental and somatic dysfunction of cervical region: Secondary | ICD-10-CM | POA: Diagnosis not present

## 2019-06-03 DIAGNOSIS — M5136 Other intervertebral disc degeneration, lumbar region: Secondary | ICD-10-CM | POA: Diagnosis not present

## 2019-06-03 DIAGNOSIS — M9903 Segmental and somatic dysfunction of lumbar region: Secondary | ICD-10-CM | POA: Diagnosis not present

## 2019-06-09 ENCOUNTER — Telehealth: Payer: Self-pay | Admitting: *Deleted

## 2019-06-09 NOTE — Telephone Encounter (Signed)
Patient calling to schedule COVID vaccine. Pt advised that appts for the vaccine were not being scheduled at this time. Pt notified that he would need fill out the waitli st form online. Pt states he filled out the form last night. Pt advised that he would be contacted once appts are available. Understanding verbalized.

## 2019-06-14 DIAGNOSIS — Z23 Encounter for immunization: Secondary | ICD-10-CM | POA: Diagnosis not present

## 2019-07-01 DIAGNOSIS — M5136 Other intervertebral disc degeneration, lumbar region: Secondary | ICD-10-CM | POA: Diagnosis not present

## 2019-07-01 DIAGNOSIS — M9901 Segmental and somatic dysfunction of cervical region: Secondary | ICD-10-CM | POA: Diagnosis not present

## 2019-07-01 DIAGNOSIS — M5416 Radiculopathy, lumbar region: Secondary | ICD-10-CM | POA: Diagnosis not present

## 2019-07-01 DIAGNOSIS — M9903 Segmental and somatic dysfunction of lumbar region: Secondary | ICD-10-CM | POA: Diagnosis not present

## 2019-07-05 DIAGNOSIS — Z23 Encounter for immunization: Secondary | ICD-10-CM | POA: Diagnosis not present

## 2019-07-29 DIAGNOSIS — M9903 Segmental and somatic dysfunction of lumbar region: Secondary | ICD-10-CM | POA: Diagnosis not present

## 2019-07-29 DIAGNOSIS — M9901 Segmental and somatic dysfunction of cervical region: Secondary | ICD-10-CM | POA: Diagnosis not present

## 2019-07-29 DIAGNOSIS — M5416 Radiculopathy, lumbar region: Secondary | ICD-10-CM | POA: Diagnosis not present

## 2019-07-29 DIAGNOSIS — M5136 Other intervertebral disc degeneration, lumbar region: Secondary | ICD-10-CM | POA: Diagnosis not present

## 2019-08-03 ENCOUNTER — Ambulatory Visit (INDEPENDENT_AMBULATORY_CARE_PROVIDER_SITE_OTHER): Payer: Medicare Other | Admitting: Nurse Practitioner

## 2019-08-03 ENCOUNTER — Encounter: Payer: Self-pay | Admitting: Nurse Practitioner

## 2019-08-03 ENCOUNTER — Other Ambulatory Visit: Payer: Self-pay

## 2019-08-03 VITALS — BP 136/87 | HR 63 | Temp 97.8°F | Ht 69.3 in | Wt 238.0 lb

## 2019-08-03 DIAGNOSIS — N183 Chronic kidney disease, stage 3 unspecified: Secondary | ICD-10-CM | POA: Insufficient documentation

## 2019-08-03 DIAGNOSIS — N1831 Chronic kidney disease, stage 3a: Secondary | ICD-10-CM | POA: Diagnosis not present

## 2019-08-03 DIAGNOSIS — I10 Essential (primary) hypertension: Secondary | ICD-10-CM | POA: Diagnosis not present

## 2019-08-03 DIAGNOSIS — E7800 Pure hypercholesterolemia, unspecified: Secondary | ICD-10-CM | POA: Diagnosis not present

## 2019-08-03 NOTE — Progress Notes (Signed)
BP 136/87   Pulse 63   Temp 97.8 F (36.6 C) (Oral)   Ht 5' 9.3" (1.76 m)   Wt 238 lb (108 kg)   SpO2 97%   BMI 34.84 kg/m    Subjective:    Patient ID: Paul Rodriguez, male    DOB: [birth year], 69 y.o.   MRN: [id number]  HPI: Paul Rodriguez is a 69 y.o. male  Chief Complaint  Patient presents with  . Hypertension  . Hyperlipidemia   HYPERTENSION / HYPERLIPIDEMIA Continues on Losartan 100 MG daily, tried Crestor 5 MG every other day but continued to have muscle aches with this, which made it difficult as he has underlying RA.  Saw cardiology in past, but none recently.  Has tried Atorvastatin in past.   Satisfied with current treatment? yes Duration of hypertension: chronic BP monitoring frequency: not checking BP range:  BP medication side effects: no Duration of hyperlipidemia: chronic Cholesterol medication side effects: yes Cholesterol supplements: none Medication compliance: good compliance Aspirin: yes Recent stressors: no Recurrent headaches: no Visual changes: no Palpitations: no Dyspnea: no Chest pain: no Lower extremity edema: no Dizzy/lightheaded: no  The 10-year ASCVD risk score Paul Rodriguez) is: 22.6%   Values used to calculate the score:     Age: 42 years     Sex: Male     Is Non-Hispanic African American: No     Diabetic: No     Tobacco smoker: Yes     Systolic Blood Pressure: [id number] mmHg     Is BP treated: Yes     HDL Cholesterol: 42 mg/dL     Total Cholesterol: 152 mg/dL   CHRONIC KIDNEY DISEASE Noted on past labs with CRT 1.31 and GFR 56.  CKD status: stable Medications renally dose: yes Previous renal evaluation: no Pneumovax:  Up to Date Influenza Vaccine:  Up to Date  Relevant past medical, surgical, family and social history reviewed and updated as indicated. Interim medical history since our last visit reviewed. Allergies and medications reviewed and updated.  Review of Systems  Constitutional: Negative for  activity change, diaphoresis, fatigue and fever.  Respiratory: Negative for cough, chest tightness, shortness of breath and wheezing.   Cardiovascular: Negative for chest pain, palpitations and leg swelling.  Gastrointestinal: Negative.   Neurological: Negative.   Psychiatric/Behavioral: Negative.     Per HPI unless specifically indicated above     Objective:    BP 136/87   Pulse 63   Temp 97.8 F (36.6 C) (Oral)   Ht 5' 9.3" (1.76 m)   Wt 238 lb (108 kg)   SpO2 97%   BMI 34.84 kg/m   Wt Readings from Last 3 Encounters:  08/03/19 238 lb (108 kg)  02/02/19 233 lb 12.8 oz (106.1 kg)  01/05/19 242 lb (109.8 kg)    Physical Exam Vitals and nursing note reviewed.  Constitutional:      General: He is not in acute distress.    Appearance: He is well-developed, well-groomed and overweight. He is not ill-appearing.  HENT:     Head: Normocephalic and atraumatic.     Right Ear: Hearing normal. No drainage.     Left Ear: Hearing normal. No drainage.  Eyes:     General: Lids are normal.        Right eye: No discharge.        Left eye: No discharge.     Conjunctiva/sclera: Conjunctivae normal.     Pupils: Pupils are  equal, round, and reactive to light.  Neck:     Vascular: No carotid bruit.  Cardiovascular:     Rate and Rhythm: Normal rate and regular rhythm.     Heart sounds: Normal heart sounds, S1 normal and S2 normal. No murmur. No gallop.   Pulmonary:     Effort: Pulmonary effort is normal. No accessory muscle usage or respiratory distress.     Breath sounds: Normal breath sounds.  Abdominal:     General: Bowel sounds are normal.     Palpations: Abdomen is soft.  Musculoskeletal:        General: Normal range of motion.     Cervical back: Normal range of motion and neck supple.     Right lower leg: No edema.     Left lower leg: No edema.  Skin:    General: Skin is warm and dry.     Capillary Refill: Capillary refill takes less than 2 seconds.  Neurological:      Mental Status: He is alert and oriented to person, place, and time.     Deep Tendon Reflexes: Reflexes are normal and symmetric.  Psychiatric:        Attention and Perception: Attention normal.        Mood and Affect: Mood normal.        Speech: Speech normal.        Behavior: Behavior normal.     Results for orders placed or performed in visit on 02/10/19  Novel Coronavirus, NAA ([facility])   Specimen: Oropharyngeal(OP) collection in vial transport medium   OROPHARYNGEA  TESTING  Result Value Ref Range   SARS-CoV-2, NAA Not Detected Not Detected      Assessment & Plan:   Problem List Items Addressed This Visit      Cardiovascular and Mediastinum   Essential hypertension - Primary    Chronic, stable with BP close today, suspect home readings will be at goal.  Recommend he check BP at home at least a few days a week and document.  Continue current medication regimen and adjust as needed.  BMP today.      Relevant Orders   Basic metabolic panel     Genitourinary   CKD (chronic kidney disease) stage 3, GFR 30-59 ml/min    New, noted on recent labs.  Recheck BMP today and continue Losartan for kidney protection.  Educated patient.        Other   Hyperlipidemia    Chronic, ongoing.  Recommend he try taking Crestor once a week and adjust as needed.  Lipid panel today.  If poor tolerance to once a week dosing will consider twice weekly or monthly Repatha injections.      Relevant Orders   Lipid Panel w/o Chol/HDL Ratio       Follow up plan: Return in about 6 months (around 02/03/2020) for Annual physical.

## 2019-08-03 NOTE — Patient Instructions (Signed)
Fat and Cholesterol Restricted Eating Plan Getting too much fat and cholesterol in your diet may cause health problems. Choosing the right foods helps keep your fat and cholesterol at normal levels. This can keep you from getting certain diseases. Your doctor may recommend an eating plan that includes:  Total fat: ______% or less of total calories a day.  Saturated fat: ______% or less of total calories a day.  Cholesterol: less than _________mg a day.  Fiber: ______g a day. What are tips for following this plan? Meal planning  At meals, divide your plate into four equal parts: ? Fill one-half of your plate with vegetables and green salads. ? Fill one-fourth of your plate with whole grains. ? Fill one-fourth of your plate with low-fat (lean) protein foods.  Eat fish that is high in omega-3 fats at least two times a week. This includes mackerel, tuna, sardines, and salmon.  Eat foods that are high in fiber, such as whole grains, beans, apples, broccoli, carrots, peas, and barley. General tips   Work with your doctor to lose weight if you need to.  Avoid: ? Foods with added sugar. ? Fried foods. ? Foods with partially hydrogenated oils.  Limit alcohol intake to no more than 1 drink a day for nonpregnant women and 2 drinks a day for men. One drink equals 12 oz of beer, 5 oz of wine, or 1 oz of hard liquor. Reading food labels  Check food labels for: ? Trans fats. ? Partially hydrogenated oils. ? Saturated fat (g) in each serving. ? Cholesterol (mg) in each serving. ? Fiber (g) in each serving.  Choose foods with healthy fats, such as: ? Monounsaturated fats. ? Polyunsaturated fats. ? Omega-3 fats.  Choose grain products that have whole grains. Look for the word "whole" as the first word in the ingredient list. Cooking  Cook foods using low-fat methods. These include baking, boiling, grilling, and broiling.  Eat more home-cooked foods. Eat at restaurants and buffets  less often.  Avoid cooking using saturated fats, such as butter, cream, palm oil, palm kernel oil, and coconut oil. Recommended foods  Fruits  All fresh, canned (in natural juice), or frozen fruits. Vegetables  Fresh or frozen vegetables (raw, steamed, roasted, or grilled). Green salads. Grains  Whole grains, such as whole wheat or whole grain breads, crackers, cereals, and pasta. Unsweetened oatmeal, bulgur, barley, quinoa, or brown rice. Corn or whole wheat flour tortillas. Meats and other protein foods  Ground beef (85% or leaner), grass-fed beef, or beef trimmed of fat. Skinless chicken or turkey. Ground chicken or turkey. Pork trimmed of fat. All fish and seafood. Egg whites. Dried beans, peas, or lentils. Unsalted nuts or seeds. Unsalted canned beans. Nut butters without added sugar or oil. Dairy  Low-fat or nonfat dairy products, such as skim or 1% milk, 2% or reduced-fat cheeses, low-fat and fat-free ricotta or cottage cheese, or plain low-fat and nonfat yogurt. Fats and oils  Tub margarine without trans fats. Light or reduced-fat mayonnaise and salad dressings. Avocado. Olive, canola, sesame, or safflower oils. The items listed above may not be a complete list of foods and beverages you can eat. Contact a dietitian for more information. Foods to avoid Fruits  Canned fruit in heavy syrup. Fruit in cream or butter sauce. Fried fruit. Vegetables  Vegetables cooked in cheese, cream, or butter sauce. Fried vegetables. Grains  White bread. White pasta. White rice. Cornbread. Bagels, pastries, and croissants. Crackers and snack foods that contain trans fat   and hydrogenated oils. Meats and other protein foods  Fatty cuts of meat. Ribs, chicken wings, bacon, sausage, bologna, salami, chitterlings, fatback, hot dogs, bratwurst, and packaged lunch meats. Liver and organ meats. Whole eggs and egg yolks. Chicken and turkey with skin. Fried meat. Dairy  Whole or 2% milk, cream,  half-and-half, and cream cheese. Whole milk cheeses. Whole-fat or sweetened yogurt. Full-fat cheeses. Nondairy creamers and whipped toppings. Processed cheese, cheese spreads, and cheese curds. Beverages  Alcohol. Sugar-sweetened drinks such as sodas, lemonade, and fruit drinks. Fats and oils  Butter, stick margarine, lard, shortening, ghee, or bacon fat. Coconut, palm kernel, and palm oils. Sweets and desserts  Corn syrup, sugars, honey, and molasses. Candy. Jam and jelly. Syrup. Sweetened cereals. Cookies, pies, cakes, donuts, muffins, and ice cream. The items listed above may not be a complete list of foods and beverages you should avoid. Contact a dietitian for more information. Summary  Choosing the right foods helps keep your fat and cholesterol at normal levels. This can keep you from getting certain diseases.  At meals, fill one-half of your plate with vegetables and green salads.  Eat high-fiber foods, like whole grains, beans, apples, carrots, peas, and barley.  Limit added sugar, saturated fats, alcohol, and fried foods. This information is not intended to replace advice given to you by your health care provider. Make sure you discuss any questions you have with your health care provider. Document Revised: 01/08/2018 Document Reviewed: 01/22/2017 Elsevier Patient Education  2020 Elsevier Inc.  

## 2019-08-03 NOTE — Assessment & Plan Note (Signed)
Chronic, stable with BP close today, suspect home readings will be at goal.  Recommend he check BP at home at least a few days a week and document.  Continue current medication regimen and adjust as needed.  BMP today.

## 2019-08-03 NOTE — Assessment & Plan Note (Signed)
Chronic, ongoing.  Recommend he try taking Crestor once a week and adjust as needed.  Lipid panel today.  If poor tolerance to once a week dosing will consider twice weekly or monthly Repatha injections.

## 2019-08-03 NOTE — Assessment & Plan Note (Signed)
New, noted on recent labs.  Recheck BMP today and continue Losartan for kidney protection.  Educated patient.

## 2019-08-04 LAB — LIPID PANEL W/O CHOL/HDL RATIO
Cholesterol, Total: 144 mg/dL (ref 100–199)
HDL: 38 mg/dL — ABNORMAL LOW (ref >39)
LDL Chol Calc (NIH): 82 mg/dL (ref 0–99)
Triglycerides: 137 mg/dL (ref 0–149)
VLDL Cholesterol Cal: 24 mg/dL (ref 5–40)

## 2019-08-04 LAB — BASIC METABOLIC PANEL
BUN/Creatinine Ratio: 12 (ref 10–24)
BUN: 15 mg/dL (ref 8–27)
CO2: 24 mmol/L (ref 20–29)
Calcium: 9 mg/dL (ref 8.6–10.2)
Chloride: 106 mmol/L (ref 96–106)
Creatinine, Ser: 1.22 mg/dL (ref 0.76–1.27)
GFR calc Af Amer: 70 mL/min/{1.73_m2} (ref >59)
GFR calc non Af Amer: 61 mL/min/{1.73_m2} (ref >59)
Glucose: 99 mg/dL (ref 65–99)
Potassium: 4.6 mmol/L (ref 3.5–5.2)
Sodium: 142 mmol/L (ref 134–144)

## 2019-08-04 NOTE — Progress Notes (Signed)
Contacted via MyChart

## 2019-08-17 DIAGNOSIS — M1990 Unspecified osteoarthritis, unspecified site: Secondary | ICD-10-CM | POA: Diagnosis not present

## 2019-08-17 DIAGNOSIS — M791 Myalgia, unspecified site: Secondary | ICD-10-CM | POA: Diagnosis not present

## 2019-08-17 DIAGNOSIS — M8949 Other hypertrophic osteoarthropathy, multiple sites: Secondary | ICD-10-CM | POA: Diagnosis not present

## 2019-08-24 DIAGNOSIS — Z79899 Other long term (current) drug therapy: Secondary | ICD-10-CM | POA: Diagnosis not present

## 2019-08-24 DIAGNOSIS — M8949 Other hypertrophic osteoarthropathy, multiple sites: Secondary | ICD-10-CM | POA: Diagnosis not present

## 2019-08-24 DIAGNOSIS — M1990 Unspecified osteoarthritis, unspecified site: Secondary | ICD-10-CM | POA: Diagnosis not present

## 2019-08-24 DIAGNOSIS — M353 Polymyalgia rheumatica: Secondary | ICD-10-CM | POA: Diagnosis not present

## 2019-08-26 DIAGNOSIS — M5136 Other intervertebral disc degeneration, lumbar region: Secondary | ICD-10-CM | POA: Diagnosis not present

## 2019-08-26 DIAGNOSIS — M9903 Segmental and somatic dysfunction of lumbar region: Secondary | ICD-10-CM | POA: Diagnosis not present

## 2019-08-26 DIAGNOSIS — M5416 Radiculopathy, lumbar region: Secondary | ICD-10-CM | POA: Diagnosis not present

## 2019-08-26 DIAGNOSIS — M9901 Segmental and somatic dysfunction of cervical region: Secondary | ICD-10-CM | POA: Diagnosis not present

## 2019-09-23 DIAGNOSIS — M5416 Radiculopathy, lumbar region: Secondary | ICD-10-CM | POA: Diagnosis not present

## 2019-09-23 DIAGNOSIS — M5136 Other intervertebral disc degeneration, lumbar region: Secondary | ICD-10-CM | POA: Diagnosis not present

## 2019-09-23 DIAGNOSIS — M9903 Segmental and somatic dysfunction of lumbar region: Secondary | ICD-10-CM | POA: Diagnosis not present

## 2019-09-23 DIAGNOSIS — M9901 Segmental and somatic dysfunction of cervical region: Secondary | ICD-10-CM | POA: Diagnosis not present

## 2019-10-21 DIAGNOSIS — M5136 Other intervertebral disc degeneration, lumbar region: Secondary | ICD-10-CM | POA: Diagnosis not present

## 2019-10-21 DIAGNOSIS — M9901 Segmental and somatic dysfunction of cervical region: Secondary | ICD-10-CM | POA: Diagnosis not present

## 2019-10-21 DIAGNOSIS — M9903 Segmental and somatic dysfunction of lumbar region: Secondary | ICD-10-CM | POA: Diagnosis not present

## 2019-10-21 DIAGNOSIS — M5416 Radiculopathy, lumbar region: Secondary | ICD-10-CM | POA: Diagnosis not present

## 2019-12-23 DIAGNOSIS — M5136 Other intervertebral disc degeneration, lumbar region: Secondary | ICD-10-CM | POA: Diagnosis not present

## 2019-12-23 DIAGNOSIS — M9901 Segmental and somatic dysfunction of cervical region: Secondary | ICD-10-CM | POA: Diagnosis not present

## 2019-12-23 DIAGNOSIS — M9903 Segmental and somatic dysfunction of lumbar region: Secondary | ICD-10-CM | POA: Diagnosis not present

## 2019-12-23 DIAGNOSIS — M5416 Radiculopathy, lumbar region: Secondary | ICD-10-CM | POA: Diagnosis not present

## 2019-12-25 ENCOUNTER — Other Ambulatory Visit: Payer: Self-pay

## 2019-12-25 ENCOUNTER — Ambulatory Visit: Payer: Medicare Other

## 2019-12-25 DIAGNOSIS — Z20822 Contact with and (suspected) exposure to covid-19: Secondary | ICD-10-CM | POA: Diagnosis not present

## 2019-12-26 LAB — SARS-COV-2, NAA 2 DAY TAT

## 2019-12-26 LAB — NOVEL CORONAVIRUS, NAA: SARS-CoV-2, NAA: NOT DETECTED

## 2020-01-07 ENCOUNTER — Other Ambulatory Visit: Payer: Self-pay | Admitting: Nurse Practitioner

## 2020-01-07 DIAGNOSIS — Z23 Encounter for immunization: Secondary | ICD-10-CM | POA: Diagnosis not present

## 2020-01-07 NOTE — Telephone Encounter (Signed)
Requested Prescriptions  Pending Prescriptions Disp Refills  . losartan (COZAAR) 100 MG tablet [Pharmacy Med Name: LOSARTAN POTASSIUM 100 MG TAB] 90 tablet 3    Sig: TAKE 1 TABLET BY MOUTH EVERY DAY     Cardiovascular:  Angiotensin Receptor Blockers Passed - 01/07/2020  4:16 AM      Passed - Cr in normal range and within 180 days    Creatinine, Ser  Date Value Ref Range Status  08/03/2019 1.22 0.76 - 1.27 mg/dL Final         Passed - K in normal range and within 180 days    Potassium  Date Value Ref Range Status  08/03/2019 4.6 3.5 - 5.2 mmol/L Final         Passed - Patient is not pregnant      Passed - Last BP in normal range    BP Readings from Last 1 Encounters:  08/03/19 136/87         Passed - Valid encounter within last 6 months    Recent Outpatient Visits          5 months ago Essential hypertension   [place], [place] T, NP   11 months ago Sinusitis, unspecified chronicity, unspecified location   [facility] [place], Jeannette How, MD   11 months ago Annual physical exam   [place], Henrine Screws T, NP   1 year ago Essential hypertension   [facility] Cr[place], Jeannette How, MD   1 year ago Suspected Covid-19 Virus Infection   [facility] [place], Megan P, DO      Future Appointments            In 3 weeks Cannady, Barbaraann Faster, NP [employer]

## 2020-01-20 DIAGNOSIS — M955 Acquired deformity of pelvis: Secondary | ICD-10-CM | POA: Diagnosis not present

## 2020-01-20 DIAGNOSIS — M5431 Sciatica, right side: Secondary | ICD-10-CM | POA: Diagnosis not present

## 2020-01-20 DIAGNOSIS — M9905 Segmental and somatic dysfunction of pelvic region: Secondary | ICD-10-CM | POA: Diagnosis not present

## 2020-01-20 DIAGNOSIS — M9903 Segmental and somatic dysfunction of lumbar region: Secondary | ICD-10-CM | POA: Diagnosis not present

## 2020-01-21 DIAGNOSIS — H2511 Age-related nuclear cataract, right eye: Secondary | ICD-10-CM | POA: Diagnosis not present

## 2020-01-21 DIAGNOSIS — Z23 Encounter for immunization: Secondary | ICD-10-CM | POA: Diagnosis not present

## 2020-01-26 DIAGNOSIS — M9905 Segmental and somatic dysfunction of pelvic region: Secondary | ICD-10-CM | POA: Diagnosis not present

## 2020-01-26 DIAGNOSIS — M5431 Sciatica, right side: Secondary | ICD-10-CM | POA: Diagnosis not present

## 2020-01-26 DIAGNOSIS — M9903 Segmental and somatic dysfunction of lumbar region: Secondary | ICD-10-CM | POA: Diagnosis not present

## 2020-01-26 DIAGNOSIS — M955 Acquired deformity of pelvis: Secondary | ICD-10-CM | POA: Diagnosis not present

## 2020-02-01 ENCOUNTER — Ambulatory Visit (INDEPENDENT_AMBULATORY_CARE_PROVIDER_SITE_OTHER): Payer: Medicare Other

## 2020-02-01 VITALS — Ht 69.0 in | Wt 230.0 lb

## 2020-02-01 DIAGNOSIS — Z0000 Encounter for general adult medical examination without abnormal findings: Secondary | ICD-10-CM

## 2020-02-01 NOTE — Progress Notes (Signed)
I connected with Erenest Blank today by telephone and verified that I am speaking with the correct person using two identifiers. Location patient: home Location provider: work Persons participating in the virtual visit: Lattie Cervi, Glenna Durand LPN.   I discussed the limitations, risks, security and privacy concerns of performing an evaluation and management service by telephone and the availability of in person appointments. I also discussed with the patient that there may be a patient responsible charge related to this service. The patient expressed understanding and verbally consented to this telephonic visit.    Interactive audio and video telecommunications were attempted between this provider and patient, however failed, due to patient having technical difficulties OR patient did not have access to video capability.  We continued and completed visit with audio only.     Vital signs may be patient reported or missing.  Subjective:   Paul Rodriguez is a 69 y.o. male who presents for Medicare Annual/Subsequent preventive examination.  Review of Systems     Cardiac Risk Factors include: advanced age (>33men, >38 women);hypertension;male gender;obesity (BMI >30kg/m2);sedentary lifestyle     Objective:    Today's Vitals   02/01/20 0811  Weight: 230 lb (104.3 kg)  Height: 5\' 9"  (1.753 m)   Body mass index is 33.97 kg/m.  Advanced Directives 02/01/2020 01/05/2019 07/23/2018 05/02/2017  Does Patient Have a Medical Advance Directive? Yes Yes Yes Yes  Type of Paramedic of [place];Living will [place];Living will [place];Living will  Does patient want to make changes to medical advance directive? - - No - Patient declined -  Copy of [place] in Chart? No - copy requested No - copy requested No - copy requested -    Current Medications  (verified) Outpatient Encounter Medications as of 02/01/2020  Medication Sig  . aspirin 81 MG tablet Take 81 mg by mouth daily.  Marland Kitchen EPINEPHrine (EPIPEN 2-PAK) 0.3 mg/0.3 mL IJ SOAJ injection   . FOLIC ACID PO Take by mouth daily.  Marland Kitchen ibuprofen (ADVIL,MOTRIN) 200 MG tablet Take 400 mg by mouth daily.  Marland Kitchen loratadine-pseudoephedrine (CLARITIN-D 12-HOUR) 5-120 MG tablet Take by mouth every 12 (twelve) hours as needed.  Marland Kitchen losartan (COZAAR) 100 MG tablet TAKE 1 TABLET BY MOUTH EVERY DAY  . methotrexate (RHEUMATREX) 2.5 MG tablet Take 15 mg by mouth once a week.   . Multiple Vitamin (MULTIVITAMIN) tablet Take 1 tablet by mouth daily.  Marland Kitchen omeprazole (PRILOSEC) 20 MG capsule Take 20 mg by mouth daily.  . tadalafil (CIALIS) 5 MG tablet Take 1 tablet (5 mg total) by mouth daily as needed for erectile dysfunction.   No facility-administered encounter medications on file as of 02/01/2020.    Allergies (verified) Ultram [tramadol]   History: Past Medical History:  Diagnosis Date  . Allergy   . Arthritis    Rheumatoid arthritis  . Depression   . GERD (gastroesophageal reflux disease)   . History of kidney stones   . Hyperlipidemia   . Hypertension    Past Surgical History:  Procedure Laterality Date  . COLONOSCOPY WITH PROPOFOL N/A 07/23/2018   Procedure: COLONOSCOPY WITH PROPOFOL;  Surgeon: Lin Landsman, MD;  Location: [facility];  Service: Endoscopy;  Laterality: N/A;  Requests early  . EYE SURGERY  Lasik  . HIP SURGERY Right 2014   arthroscopic  . HIP SURGERY Left Nov 2014   arthroscopic  . KNEE SURGERY Left Nov 2014  .  NOSE SURGERY    . POLYPECTOMY  07/23/2018   Procedure: POLYPECTOMY;  Surgeon: Lin Landsman, MD;  Location: [facility];  Service: Endoscopy;;   Family History  Problem Relation Age of Onset  . Hypertension Mother   . Hypertension Father   . Heart disease Father   . Stroke Father   . Diabetes Maternal Grandfather   . Heart disease Maternal  Grandmother   . Cancer Paternal Grandmother   . Cancer Paternal Grandfather    Social History   Socioeconomic History  . Marital status: Married    Spouse name: Not on file  . Number of children: Not on file  . Years of education: Not on file  . Highest education level: Associate degree: academic program  Occupational History  . Occupation: retired   Tobacco Use  . Smoking status: Former Smoker    Packs/day: 1.00    Years: 10.00    Pack years: 10.00    Types: Cigarettes    Quit date: 05/22/1991    Years since quitting: 28.7  . Smokeless tobacco: Never Used  Vaping Use  . Vaping Use: Never used  Substance and Sexual Activity  . Alcohol use: Yes    Alcohol/week: 5.0 standard drinks    Types: 5 Standard drinks or equivalent per week    Comment: on occasion/socially  . Drug use: No  . Sexual activity: Yes    Birth control/protection: None  Other Topics Concern  . Not on file  Social History Narrative   Stays active around the house   Social Determinants of Health   Financial Resource Strain: Low Risk   . Difficulty of Paying Living Expenses: Not hard at all  Food Insecurity: No Food Insecurity  . Worried About Charity fundraiser in the Last Year: Never true  . Ran Out of Food in the Last Year: Never true  Transportation Needs: No Transportation Needs  . Lack of Transportation (Medical): No  . Lack of Transportation (Non-Medical): No  Physical Activity: Inactive  . Days of Exercise per Week: 0 days  . Minutes of Exercise per Session: 0 min  Stress: No Stress Concern Present  . Feeling of Stress : Not at all  Social Connections:   . Frequency of Communication with Friends and Family: Not on file  . Frequency of Social Gatherings with Friends and Family: Not on file  . Attends Religious Services: Not on file  . Active Member of Clubs or Organizations: Not on file  . Attends Archivist Meetings: Not on file  . Marital Status: Not on file    Tobacco  Counseling Counseling given: Not Answered   Clinical Intake:  Pre-visit preparation completed: Yes  Pain : No/denies pain     Nutritional Risks: None Diabetes: No  How often do you need to have someone help you when you read instructions, pamphlets, or other written materials from your doctor or pharmacy?: 1 - Never What is the last grade level you completed in school?: college  Diabetic? no  Interpreter Needed?: No  Information entered by :: NAllen LPN   Activities of Daily Living In your present state of health, do you have any difficulty performing the following activities: 02/01/2020 02/02/2019  Hearing? Y N  Comment hearing aides -  Vision? N N  Difficulty concentrating or making decisions? N N  Walking or climbing stairs? N N  Dressing or bathing? N N  Doing errands, shopping? N N  Preparing Food and eating ?  N -  Using the Toilet? N -  In the past six months, have you accidently leaked urine? Y -  Comment if held too long -  Do you have problems with loss of bowel control? N -  Managing your Medications? N -  Managing your Finances? N -  Housekeeping or managing your Housekeeping? N -  Some recent data might be hidden    Patient Care Team: Guadalupe Maple, MD as PCP - General (Family Medicine) Lucilla Lame, MD as Consulting Physician (Gastroenterology) Jac Canavan, MD (Unknown Physician Specialty) Emmaline Kluver., MD (Rheumatology)  Indicate any recent Medical Services you may have received from other than [facility] providers in the past year (date may be approximate).     Assessment:   This is a routine wellness examination for Paul Rodriguez.  Hearing/Vision screen  Hearing Screening   125Hz  250Hz  500Hz  1000Hz  2000Hz  3000Hz  4000Hz  6000Hz  8000Hz   Right ear:           Left ear:           Vision Screening Comments: Regular eye exams, [facility]  Dietary issues and exercise activities discussed: Current Exercise Habits: The patient does not  participate in regular exercise at present  Goals    .  Exercise 3x per week (30 min per time) (pt-stated)      Discussed walking for 30 min 3x a week.     .  Patient Stated      02/01/2020, no goals      Depression Screen PHQ 2/9 Scores 02/01/2020 01/05/2019 02/14/2017 08/13/2016 08/03/2015  PHQ - 2 Score 0 0 0 0 0  PHQ- 9 Score 0 - - - -    Fall Risk Fall Risk  02/01/2020 01/05/2019 02/14/2017 08/13/2016 08/03/2015  Falls in the past year? 0 0 No No No  Risk for fall due to : Medication side effect - - - -  Follow up Falls evaluation completed;Education provided;Falls prevention discussed - - - -    Any stairs in or around the home? Yes  If so, are there any without handrails? No  Home free of loose throw rugs in walkways, pet beds, electrical cords, etc? Yes  Adequate lighting in your home to reduce risk of falls? Yes   ASSISTIVE DEVICES UTILIZED TO PREVENT FALLS:  Life alert? No  Use of a cane, walker or w/c? No  Grab bars in the bathroom? No  Shower chair or bench in shower? No  Elevated toilet seat or a handicapped toilet? No   TIMED UP AND GO:  Was the test performed? No . .  Cognitive Function:     6CIT Screen 02/01/2020 02/02/2019  What Year? 0 points 0 points  What month? 0 points 0 points  What time? 0 points 0 points  Count back from 20 0 points 0 points  Months in reverse 0 points 0 points  Repeat phrase 2 points 0 points  Total Score 2 0    Immunizations Immunization History  Administered Date(s) Administered  . Fluad Quad(high Dose 65+) 02/02/2019  . Influenza, High Dose Seasonal PF 02/26/2018  . Influenza-Unspecified 02/21/2015, 01/17/2016, 01/26/2020  . PFIZER SARS-COV-2 Vaccination 06/14/2019, 07/05/2019, 01/07/2020  . Pneumococcal Conjugate-13 02/26/2018  . Pneumococcal Polysaccharide-23 02/23/2016  . Tdap 12/17/2013  . Zoster 02/23/2016  . Zoster Recombinat (Shingrix) 03/16/2019, 09/19/2019    TDAP status: Up to date Flu Vaccine status: Up to  date Pneumococcal vaccine status: Up to date Covid-19 vaccine status: Completed vaccines  Qualifies for Shingles Vaccine? Yes   Zostavax completed Yes   Shingrix Completed?: Yes  Screening Tests Health Maintenance  Topic Date Due  . PNA vac Low Risk Adult (2 of 2 - PPSV23) 02/22/2021  . TETANUS/TDAP  12/18/2023  . COLONOSCOPY  07/22/2025  . INFLUENZA VACCINE  Completed  . COVID-19 Vaccine  Completed  . Hepatitis C Screening  Completed    Health Maintenance  There are no preventive care reminders to display for this patient.  Colorectal cancer screening: Completed 07/23/2018. Repeat every 7 years  Lung Cancer Screening: (Low Dose CT Chest recommended if Age 70-80 years, 30 pack-year currently smoking OR have quit w/in 15years.) does not qualify.   Lung Cancer Screening Referral: no  Additional Screening:  Hepatitis C Screening: does qualify; Completed 08/03/2015  Vision Screening: Recommended annual ophthalmology exams for early detection of glaucoma and other disorders of the eye. Is the patient up to date with their annual eye exam?  Yes  Who is the provider or what is the name of the office in which the patient attends annual eye exams? [facility] If pt is not established with a provider, would they like to be referred to a provider to establish care? No .   Dental Screening: Recommended annual dental exams for proper oral hygiene  Community Resource Referral / Chronic Care Management: CRR required this visit?  No   CCM required this visit?  No      Plan:     I have personally reviewed and noted the following in the patient's chart:   . Medical and social history . Use of alcohol, tobacco or illicit drugs  . Current medications and supplements . Functional ability and status . Nutritional status . Physical activity . Advanced directives . List of other physicians . Hospitalizations, surgeries, and ER visits in previous 12  months . Vitals . Screenings to include cognitive, depression, and falls . Referrals and appointments  In addition, I have reviewed and discussed with patient certain preventive protocols, quality metrics, and best practice recommendations. A written personalized care plan for preventive services as well as general preventive health recommendations were provided to patient.     Kellie Simmering, LPN   [id number]   Nurse Notes:

## 2020-02-01 NOTE — Patient Instructions (Signed)
Paul Rodriguez , Thank you for taking time to come for your Medicare Wellness Visit. I appreciate your ongoing commitment to your health goals. Please review the following plan we discussed and let me know if I can assist you in the future.   Screening recommendations/referrals: Colonoscopy: completed 07/23/2018 Recommended yearly ophthalmology/optometry visit for glaucoma screening and checkup Recommended yearly dental visit for hygiene and checkup  Vaccinations: Influenza vaccine: completed 01/26/2020 Pneumococcal vaccine: completed 02/26/2018 Tdap vaccine: completed 12/17/2013 Shingles vaccine: completed   Covid-19:  01/07/2020, 07/05/2019, 06/14/2019  Advanced directives: Please bring a copy of your POA (Power of Attorney) and/or Living Will to your next appointment.   Conditions/risks identified: none  Next appointment: Follow up in one year for your annual wellness visit.   Preventive Care 66 Years and Older, Male Preventive care refers to lifestyle choices and visits with your health care provider that can promote health and wellness. What does preventive care include?  A yearly physical exam. This is also called an annual well check.  Dental exams once or twice a year.  Routine eye exams. Ask your health care provider how often you should have your eyes checked.  Personal lifestyle choices, including:  Daily care of your teeth and gums.  Regular physical activity.  Eating a healthy diet.  Avoiding tobacco and drug use.  Limiting alcohol use.  Practicing safe sex.  Taking low doses of aspirin every day.  Taking vitamin and mineral supplements as recommended by your health care provider. What happens during an annual well check? The services and screenings done by your health care provider during your annual well check will depend on your age, overall health, lifestyle risk factors, and family history of disease. Counseling  Your health care provider may ask you questions  about your:  Alcohol use.  Tobacco use.  Drug use.  Emotional well-being.  Home and relationship well-being.  Sexual activity.  Eating habits.  History of falls.  Memory and ability to understand (cognition).  Work and work Statistician. Screening  You may have the following tests or measurements:  Height, weight, and BMI.  Blood pressure.  Lipid and cholesterol levels. These may be checked every 5 years, or more frequently if you are over 23 years old.  Skin check.  Lung cancer screening. You may have this screening every year starting at age 104 if you have a 30-pack-year history of smoking and currently smoke or have quit within the past 15 years.  Fecal occult blood test (FOBT) of the stool. You may have this test every year starting at age 17.  Flexible sigmoidoscopy or colonoscopy. You may have a sigmoidoscopy every 5 years or a colonoscopy every 10 years starting at age 16.  Prostate cancer screening. Recommendations will vary depending on your family history and other risks.  Hepatitis C blood test.  Hepatitis B blood test.  Sexually transmitted disease (STD) testing.  Diabetes screening. This is done by checking your blood sugar (glucose) after you have not eaten for a while (fasting). You may have this done every 1-3 years.  Abdominal aortic aneurysm (AAA) screening. You may need this if you are a current or former smoker.  Osteoporosis. You may be screened starting at age 55 if you are at high risk. Talk with your health care provider about your test results, treatment options, and if necessary, the need for more tests. Vaccines  Your health care provider may recommend certain vaccines, such as:  Influenza vaccine. This is recommended every year.  Tetanus, diphtheria, and acellular pertussis (Tdap, Td) vaccine. You may need a Td booster every 10 years.  Zoster vaccine. You may need this after age 69.  Pneumococcal 13-valent conjugate (PCV13)  vaccine. One dose is recommended after age 46.  Pneumococcal polysaccharide (PPSV23) vaccine. One dose is recommended after age 18. Talk to your health care provider about which screenings and vaccines you need and how often you need them. This information is not intended to replace advice given to you by your health care provider. Make sure you discuss any questions you have with your health care provider. Document Released: 06/03/2015 Document Revised: 01/25/2016 Document Reviewed: 03/08/2015 Elsevier Interactive Patient Education  2017 [employer] Prevention in the Home Falls can cause injuries. They can happen to people of all ages. There are many things you can do to make your home safe and to help prevent falls. What can I do on the outside of my home?  Regularly fix the edges of walkways and driveways and fix any cracks.  Remove anything that might make you trip as you walk through a door, such as a raised step or threshold.  Trim any bushes or trees on the path to your home.  Use bright outdoor lighting.  Clear any walking paths of anything that might make someone trip, such as rocks or tools.  Regularly check to see if handrails are loose or broken. Make sure that both sides of any steps have handrails.  Any raised decks and porches should have guardrails on the edges.  Have any leaves, snow, or ice cleared regularly.  Use sand or salt on walking paths during winter.  Clean up any spills in your garage right away. This includes oil or grease spills. What can I do in the bathroom?  Use night lights.  Install grab bars by the toilet and in the tub and shower. Do not use towel bars as grab bars.  Use non-skid mats or decals in the tub or shower.  If you need to sit down in the shower, use a plastic, non-slip stool.  Keep the floor dry. Clean up any water that spills on the floor as soon as it happens.  Remove soap buildup in the tub or shower  regularly.  Attach bath mats securely with double-sided non-slip rug tape.  Do not have throw rugs and other things on the floor that can make you trip. What can I do in the bedroom?  Use night lights.  Make sure that you have a light by your bed that is easy to reach.  Do not use any sheets or blankets that are too big for your bed. They should not hang down onto the floor.  Have a firm chair that has side arms. You can use this for support while you get dressed.  Do not have throw rugs and other things on the floor that can make you trip. What can I do in the kitchen?  Clean up any spills right away.  Avoid walking on wet floors.  Keep items that you use a lot in easy-to-reach places.  If you need to reach something above you, use a strong step stool that has a grab bar.  Keep electrical cords out of the way.  Do not use floor polish or wax that makes floors slippery. If you must use wax, use non-skid floor wax.  Do not have throw rugs and other things on the floor that can make you trip. What can I do  with my stairs?  Do not leave any items on the stairs.  Make sure that there are handrails on both sides of the stairs and use them. Fix handrails that are broken or loose. Make sure that handrails are as long as the stairways.  Check any carpeting to make sure that it is firmly attached to the stairs. Fix any carpet that is loose or worn.  Avoid having throw rugs at the top or bottom of the stairs. If you do have throw rugs, attach them to the floor with carpet tape.  Make sure that you have a light switch at the top of the stairs and the bottom of the stairs. If you do not have them, ask someone to add them for you. What else can I do to help prevent falls?  Wear shoes that:  Do not have high heels.  Have rubber bottoms.  Are comfortable and fit you well.  Are closed at the toe. Do not wear sandals.  If you use a stepladder:  Make sure that it is fully  opened. Do not climb a closed stepladder.  Make sure that both sides of the stepladder are locked into place.  Ask someone to hold it for you, if possible.  Clearly mark and make sure that you can see:  Any grab bars or handrails.  First and last steps.  Where the edge of each step is.  Use tools that help you move around (mobility aids) if they are needed. These include:  Canes.  Walkers.  Scooters.  Crutches.  Turn on the lights when you go into a dark area. Replace any light bulbs as soon as they burn out.  Set up your furniture so you have a clear path. Avoid moving your furniture around.  If any of your floors are uneven, fix them.  If there are any pets around you, be aware of where they are.  Review your medicines with your doctor. Some medicines can make you feel dizzy. This can increase your chance of falling. Ask your doctor what other things that you can do to help prevent falls. This information is not intended to replace advice given to you by your health care provider. Make sure you discuss any questions you have with your health care provider. Document Released: 03/03/2009 Document Revised: 10/13/2015 Document Reviewed: 06/11/2014 Elsevier Interactive Patient Education  2017 [employer].

## 2020-02-03 ENCOUNTER — Encounter: Payer: Medicare Other | Admitting: Nurse Practitioner

## 2020-02-23 DIAGNOSIS — M9903 Segmental and somatic dysfunction of lumbar region: Secondary | ICD-10-CM | POA: Diagnosis not present

## 2020-02-23 DIAGNOSIS — M5136 Other intervertebral disc degeneration, lumbar region: Secondary | ICD-10-CM | POA: Diagnosis not present

## 2020-02-23 DIAGNOSIS — M353 Polymyalgia rheumatica: Secondary | ICD-10-CM | POA: Diagnosis not present

## 2020-02-23 DIAGNOSIS — M8949 Other hypertrophic osteoarthropathy, multiple sites: Secondary | ICD-10-CM | POA: Diagnosis not present

## 2020-02-23 DIAGNOSIS — M5033 Other cervical disc degeneration, cervicothoracic region: Secondary | ICD-10-CM | POA: Diagnosis not present

## 2020-02-23 DIAGNOSIS — M9901 Segmental and somatic dysfunction of cervical region: Secondary | ICD-10-CM | POA: Diagnosis not present

## 2020-02-23 DIAGNOSIS — M1990 Unspecified osteoarthritis, unspecified site: Secondary | ICD-10-CM | POA: Diagnosis not present

## 2020-02-23 DIAGNOSIS — Z79899 Other long term (current) drug therapy: Secondary | ICD-10-CM | POA: Diagnosis not present

## 2020-02-29 DIAGNOSIS — Z79899 Other long term (current) drug therapy: Secondary | ICD-10-CM | POA: Diagnosis not present

## 2020-02-29 DIAGNOSIS — M8949 Other hypertrophic osteoarthropathy, multiple sites: Secondary | ICD-10-CM | POA: Diagnosis not present

## 2020-02-29 DIAGNOSIS — M791 Myalgia, unspecified site: Secondary | ICD-10-CM | POA: Diagnosis not present

## 2020-02-29 DIAGNOSIS — M1990 Unspecified osteoarthritis, unspecified site: Secondary | ICD-10-CM | POA: Diagnosis not present

## 2020-02-29 DIAGNOSIS — M353 Polymyalgia rheumatica: Secondary | ICD-10-CM | POA: Diagnosis not present

## 2020-03-04 ENCOUNTER — Other Ambulatory Visit: Payer: Self-pay

## 2020-03-04 ENCOUNTER — Ambulatory Visit (INDEPENDENT_AMBULATORY_CARE_PROVIDER_SITE_OTHER): Payer: Medicare Other | Admitting: Nurse Practitioner

## 2020-03-04 ENCOUNTER — Encounter: Payer: Self-pay | Admitting: Nurse Practitioner

## 2020-03-04 VITALS — BP 131/85 | HR 60 | Temp 98.2°F | Resp 16 | Ht 69.0 in | Wt 237.0 lb

## 2020-03-04 DIAGNOSIS — R12 Heartburn: Secondary | ICD-10-CM | POA: Diagnosis not present

## 2020-03-04 DIAGNOSIS — E559 Vitamin D deficiency, unspecified: Secondary | ICD-10-CM

## 2020-03-04 DIAGNOSIS — M791 Myalgia, unspecified site: Secondary | ICD-10-CM | POA: Diagnosis not present

## 2020-03-04 DIAGNOSIS — M06041 Rheumatoid arthritis without rheumatoid factor, right hand: Secondary | ICD-10-CM

## 2020-03-04 DIAGNOSIS — E7800 Pure hypercholesterolemia, unspecified: Secondary | ICD-10-CM | POA: Diagnosis not present

## 2020-03-04 DIAGNOSIS — D692 Other nonthrombocytopenic purpura: Secondary | ICD-10-CM

## 2020-03-04 DIAGNOSIS — Z0000 Encounter for general adult medical examination without abnormal findings: Secondary | ICD-10-CM | POA: Diagnosis not present

## 2020-03-04 DIAGNOSIS — I10 Essential (primary) hypertension: Secondary | ICD-10-CM

## 2020-03-04 DIAGNOSIS — N1831 Chronic kidney disease, stage 3a: Secondary | ICD-10-CM

## 2020-03-04 DIAGNOSIS — N400 Enlarged prostate without lower urinary tract symptoms: Secondary | ICD-10-CM

## 2020-03-04 DIAGNOSIS — T466X5A Adverse effect of antihyperlipidemic and antiarteriosclerotic drugs, initial encounter: Secondary | ICD-10-CM | POA: Diagnosis not present

## 2020-03-04 DIAGNOSIS — M06042 Rheumatoid arthritis without rheumatoid factor, left hand: Secondary | ICD-10-CM

## 2020-03-04 MED ORDER — LOSARTAN POTASSIUM 100 MG PO TABS
100.0000 mg | ORAL_TABLET | Freq: Every day | ORAL | 4 refills | Status: DC
Start: 2020-03-04 — End: 2020-03-18

## 2020-03-04 NOTE — Assessment & Plan Note (Signed)
Has tried multiple statins that led to myalgias, has underlying RA.  Will consider Repatha, provided information on this for him to review and he will alert provider if he wishes to trial this.  Has family history of stroke and heart issues, would benefit from LDL lowering.

## 2020-03-04 NOTE — Assessment & Plan Note (Signed)
Chronic, stable without medication.  PSA today.

## 2020-03-04 NOTE — Assessment & Plan Note (Signed)
Takes daily Prilosec as ordered by rheumatology, check Mag level today.

## 2020-03-04 NOTE — Progress Notes (Signed)
BP 131/85 (BP Location: Left Arm, Patient Position: Sitting, Cuff Size: Large)   Pulse 60   Temp 98.2 F (36.8 C) (Oral)   Resp 16   Ht 5\' 9"  (1.753 m)   Wt 237 lb (107.5 kg)   SpO2 98%   BMI 35.00 kg/m    Subjective:    Patient ID: Paul Rodriguez, male    DOB: 12/23/1950, 69 y.o.   MRN: [id number]  HPI: Paul Rodriguez is a 69 y.o. male presenting on 03/04/2020 for comprehensive medical examination. Current medical complaints include:none  He currently lives with: wife Interim Problems from his last visit: no   HYPERTENSION / HYPERLIPIDEMIA Continues on Losartan 100 MG and ASA.  Has tried multiple statins in past, but these cause pain along with his RA.  Has seen cardiology in past, 2 years ago, Dr. Nehemiah Massed.   Take Vitamin D3 daily for history of low levels.  Satisfied with current treatment? no Duration of hypertension: chronic BP monitoring frequency: weekly BP range: 130-140/80 BP medication side effects: no Duration of hyperlipidemia: chronic Cholesterol medication side effects: no Cholesterol supplements: none Medication compliance: good compliance Aspirin: yes Recent stressors: no Recurrent headaches: no Visual changes: no Palpitations: no Dyspnea: no Chest pain: no Lower extremity edema: no Dizzy/lightheaded: no  The 10-year ASCVD risk score [name], et al., 2013) is: 22.8%   Values used to calculate the score:     Age: 75 years     Sex: Male     Is Non-Hispanic African American: No     Diabetic: No     Tobacco smoker: Yes     Systolic Blood Pressure: 967 mmHg     Is BP treated: Yes     HDL Cholesterol: 38 mg/dL     Total Cholesterol: 144 mg/dL  GERD Continues on Prilosec -- takes this due to discomfort with Methotrexate. GERD control status: stable  Satisfied with current treatment? yes Heartburn frequency:  Medication side effects: no  Medication compliance: stable Previous GERD medications: Antacid use frequency:   Dysphagia:  no Odynophagia:  no Hematemesis: no Blood in stool: no EGD: no  RHEUMATOID ARTHRITIS: Followed by Dr. Jefm Bryant and continues on Methotrexate weekly, last visit 02/29/20.  Continues to take Methotrexate 15 MG weekly.    Functional Status Survey: Is the patient deaf or have difficulty hearing?: No Does the patient have difficulty seeing, even when wearing glasses/contacts?: No Does the patient have difficulty concentrating, remembering, or making decisions?: No Does the patient have difficulty walking or climbing stairs?: No Does the patient have difficulty dressing or bathing?: No Does the patient have difficulty doing errands alone such as visiting a doctor's office or shopping?: No  FALL RISK: Fall Risk  02/01/2020 01/05/2019 02/14/2017 08/13/2016 08/03/2015  Falls in the past year? 0 0 No No No  Risk for fall due to : Medication side effect - - - -  Follow up Falls evaluation completed;Education provided;Falls prevention discussed - - - -    Depression Screen Depression screen [facility] 2/9 02/01/2020 01/05/2019 02/14/2017 08/13/2016 08/03/2015  Decreased Interest 0 0 0 0 0  Down, Depressed, Hopeless 0 0 0 0 0  PHQ - 2 Score 0 0 0 0 0  Altered sleeping 0 - - - -  Tired, decreased energy 0 - - - -  Change in appetite 0 - - - -  Feeling bad or failure about yourself  0 - - - -  Trouble concentrating 0 - - - -  Moving slowly or fidgety/restless 0 - - - -  Suicidal thoughts 0 - - - -  PHQ-9 Score 0 - - - -  Difficult doing work/chores Not difficult at all - - - -    Advanced Directives <no information>  Past Medical History:  Past Medical History:  Diagnosis Date  . Allergy   . Arthritis    Rheumatoid arthritis  . Depression   . GERD (gastroesophageal reflux disease)   . History of kidney stones   . Hyperlipidemia   . Hypertension     Surgical History:  Past Surgical History:  Procedure Laterality Date  . COLONOSCOPY WITH PROPOFOL N/A 07/23/2018   Procedure: COLONOSCOPY WITH  PROPOFOL;  Surgeon: Lin Landsman, MD;  Location: [place];  Service: Endoscopy;  Laterality: N/A;  Requests early  . EYE SURGERY  Lasik  . HIP SURGERY Right 2014   arthroscopic  . HIP SURGERY Left Nov 2014   arthroscopic  . KNEE SURGERY Left Nov 2014  . NOSE SURGERY    . POLYPECTOMY  07/23/2018   Procedure: POLYPECTOMY;  Surgeon: Lin Landsman, MD;  Location: [place];  Service: Endoscopy;;    Medications:  Current Outpatient Medications on File Prior to Visit  Medication Sig  . aspirin 81 MG tablet Take 81 mg by mouth daily.  . Cholecalciferol (VITAMIN D3) 25 MCG (1000 UT) CAPS Take 3,000 Units by mouth daily.  Marland Kitchen EPINEPHrine (EPIPEN 2-PAK) 0.3 mg/0.3 mL IJ SOAJ injection   . FOLIC ACID PO Take by mouth daily.  Marland Kitchen ibuprofen (ADVIL,MOTRIN) 200 MG tablet Take 400 mg by mouth daily.  Marland Kitchen loratadine-pseudoephedrine (CLARITIN-D 12-HOUR) 5-120 MG tablet Take by mouth every 12 (twelve) hours as needed.  . methotrexate (RHEUMATREX) 2.5 MG tablet Take 15 mg by mouth once a week.   . Multiple Vitamin (MULTIVITAMIN) tablet Take 1 tablet by mouth daily.  . Omega-3 Fatty Acids (FISH OIL) 1000 MG CAPS Take 1,000 mg by mouth daily.  Marland Kitchen omeprazole (PRILOSEC) 20 MG capsule Take 20 mg by mouth daily.  . tadalafil (CIALIS) 5 MG tablet Take 1 tablet (5 mg total) by mouth daily as needed for erectile dysfunction.  . vitamin B-12 (CYANOCOBALAMIN) 1000 MCG tablet Take 1,000 mcg by mouth daily.   No current facility-administered medications on file prior to visit.    Allergies:  Allergies  Allergen Reactions  . Ultram [Tramadol] Anaphylaxis    Social History:  Social History   Socioeconomic History  . Marital status: Married    Spouse name: Not on file  . Number of children: Not on file  . Years of education: Not on file  . Highest education level: Associate degree: academic program  Occupational History  . Occupation: retired   Tobacco Use  . Smoking status:  Former Smoker    Packs/day: 1.00    Years: 10.00    Pack years: 10.00    Types: Cigarettes    Quit date: 05/22/1991    Years since quitting: 28.8  . Smokeless tobacco: Never Used  Vaping Use  . Vaping Use: Never used  Substance and Sexual Activity  . Alcohol use: Yes    Alcohol/week: 5.0 standard drinks    Types: 5 Standard drinks or equivalent per week    Comment: on occasion/socially  . Drug use: No  . Sexual activity: Yes    Birth control/protection: None  Other Topics Concern  . Not on file  Social History Narrative   Stays active around the house  Social Determinants of Health   Financial Resource Strain: Low Risk   . Difficulty of Paying Living Expenses: Not hard at all  Food Insecurity: No Food Insecurity  . Worried About Charity fundraiser in the Last Year: Never true  . Ran Out of Food in the Last Year: Never true  Transportation Needs: No Transportation Needs  . Lack of Transportation (Medical): No  . Lack of Transportation (Non-Medical): No  Physical Activity: Inactive  . Days of Exercise per Week: 0 days  . Minutes of Exercise per Session: 0 min  Stress: No Stress Concern Present  . Feeling of Stress : Not at all  Social Connections:   . Frequency of Communication with Friends and Family: Not on file  . Frequency of Social Gatherings with Friends and Family: Not on file  . Attends Religious Services: Not on file  . Active Member of Clubs or Organizations: Not on file  . Attends Archivist Meetings: Not on file  . Marital Status: Not on file  Intimate Partner Violence:   . Fear of Current or Ex-Partner: Not on file  . Emotionally Abused: Not on file  . Physically Abused: Not on file  . Sexually Abused: Not on file   Social History   Tobacco Use  Smoking Status Former Smoker  . Packs/day: 1.00  . Years: 10.00  . Pack years: 10.00  . Types: Cigarettes  . Quit date: 05/22/1991  . Years since quitting: 28.8  Smokeless Tobacco Never Used    Social History   Substance and Sexual Activity  Alcohol Use Yes  . Alcohol/week: 5.0 standard drinks  . Types: 5 Standard drinks or equivalent per week   Comment: on occasion/socially    Family History:  Family History  Problem Relation Age of Onset  . Hypertension Mother   . Hypertension Father   . Heart disease Father   . Stroke Father   . Diabetes Maternal Grandfather   . Heart disease Maternal Grandmother   . Cancer Paternal Grandmother   . Cancer Paternal Grandfather     Past medical history, surgical history, medications, allergies, family history and social history reviewed with patient today and changes made to appropriate areas of the chart.   Review of Systems - negative All other ROS negative except what is listed above and in the HPI.      Objective:    BP 131/85 (BP Location: Left Arm, Patient Position: Sitting, Cuff Size: Large)   Pulse 60   Temp 98.2 F (36.8 C) (Oral)   Resp 16   Ht 5\' 9"  (1.753 m)   Wt 237 lb (107.5 kg)   SpO2 98%   BMI 35.00 kg/m   Wt Readings from Last 3 Encounters:  03/04/20 237 lb (107.5 kg)  02/01/20 230 lb (104.3 kg)  08/03/19 238 lb (108 kg)    Physical Exam Vitals signs and nursing note reviewed.  Constitutional:      General: He is awake. He is not in acute distress.    Appearance: He is well-developed. He is obese. He is not ill-appearing.  HENT:     Head: Normocephalic and atraumatic.     Right Ear: Hearing, tympanic membrane, ear canal and external ear normal. No drainage.     Left Ear: Hearing, tympanic membrane, ear canal and external ear normal. No drainage.     Nose: Nose normal.     Mouth/Throat:     Mouth: Mucous membranes are moist.  Pharynx: Uvula midline.  Eyes:     General: Lids are normal.        Right eye: No discharge.        Left eye: No discharge.     Extraocular Movements: Extraocular movements intact.     Conjunctiva/sclera: Conjunctivae normal.     Pupils: Pupils are equal, round,  and reactive to light.     Visual Fields: Right eye visual fields normal and left eye visual fields normal.  Neck:     Musculoskeletal: Normal range of motion and neck supple.     Thyroid: No thyromegaly.     Vascular: No carotid bruit.  Cardiovascular:     Rate and Rhythm: Normal rate and regular rhythm.     Heart sounds: Normal heart sounds, S1 normal and S2 normal. No murmur. No gallop.   Pulmonary:     Effort: Pulmonary effort is normal. No accessory muscle usage or respiratory distress.     Breath sounds: Normal breath sounds.  Abdominal:     General: Bowel sounds are normal.     Palpations: Abdomen is soft. There is no hepatomegaly or splenomegaly.     Tenderness: There is no abdominal tenderness.  Musculoskeletal: Normal range of motion.     Right lower leg: No edema.     Left lower leg: No edema.  Skin:    General: Skin is warm and dry. A few scattered pale purple bruises bilateral upper extremities.    Capillary Refill: Capillary refill takes less than 2 seconds.     Findings: No rash.  Neurological:     Mental Status: He is alert and oriented to person, place, and time.     Cranial Nerves: Cranial nerves are intact.     Gait: Gait is intact.     Deep Tendon Reflexes: Reflexes are normal and symmetric.     Reflex Scores:      Brachioradialis reflexes are 2+ on the right side and 2+ on the left side.      Patellar reflexes are 2+ on the right side and 2+ on the left side. Psychiatric:        Attention and Perception: Attention normal.        Mood and Affect: Mood normal.        Speech: Speech normal.        Behavior: Behavior normal. Behavior is cooperative.        Thought Content: Thought content normal.        Judgment: Judgment normal.    6CIT Screen 02/01/2020 02/02/2019  What Year? 0 points 0 points  What month? 0 points 0 points  What time? 0 points 0 points  Count back from 20 0 points 0 points  Months in reverse 0 points 0 points  Repeat phrase 2 points 0  points  Total Score 2 0    Results for orders placed or performed in visit on 12/25/19  Novel Coronavirus, NAA ([facility])   Specimen: Nasopharyngeal(NP) swabs in vial transport medium   Nasopharynge  Screenin  Result Value Ref Range   SARS-CoV-2, NAA Not Detected Not Detected  SARS-COV-2, NAA 2 DAY TAT   Nasopharynge  Screenin  Result Value Ref Range   SARS-CoV-2, NAA 2 DAY TAT Performed       Assessment & Plan:   Problem List Items Addressed This Visit      Cardiovascular and Mediastinum   Essential hypertension    Chronic, stable with BP at goal today and home readings  at goal. Continue current medication regimen and adjust as needed.  Recommend he check BP at home at least a few days a week and document.  CMP and TSH today.  Refills sent in.  Return in 6 months for follow-up.      Relevant Medications   losartan (COZAAR) 100 MG tablet   Other Relevant Orders   Comprehensive metabolic panel   Lipid Panel With LDL/HDL Ratio   TSH   Purpura senilis (HCC)    Chronic, stable.  Recommend he monitor skin for breakdown and notify provider if present + gentle skin cleansing at home.      Relevant Medications   losartan (COZAAR) 100 MG tablet     Musculoskeletal and Integument   Rheumatoid arthritis (HCC)    Chronic, ongoing.  Continue collaboration with rheumatology and current regimen as ordered by them.  CBC and CMP today.        Genitourinary   BPH (benign prostatic hyperplasia)    Chronic, stable without medication.  PSA today.      Relevant Orders   PSA   CKD (chronic kidney disease) stage 3, GFR 30-59 ml/min (HCC)    Recent labs improved.  Recheck CMP today and continue Losartan for kidney protection.  Educated patient.      Relevant Orders   Comprehensive metabolic panel   CBC with Differential/Platelet     Other   Hyperlipidemia    Chronic, ongoing.  Has poorly tolerated all trials of statins due to myalgias and his RA.  Lipid panel today.  Will consider  Repatha, provided information on this for him to review and he will alert provider if he wishes to trial this.  Has family history of stroke and heart issues, would benefit from LDL lowering.      Relevant Medications   losartan (COZAAR) 100 MG tablet   Other Relevant Orders   Lipid Panel With LDL/HDL Ratio   Vitamin D deficiency    Ongoing, continue daily supplement and check Vit D level today.      Relevant Orders   VITAMIN D 25 Hydroxy (Vit-D Deficiency, Fractures)   Myalgia due to statin    Has tried multiple statins that led to myalgias, has underlying RA.  Will consider Repatha, provided information on this for him to review and he will alert provider if he wishes to trial this.  Has family history of stroke and heart issues, would benefit from LDL lowering.      Heart burn    Takes daily Prilosec as ordered by rheumatology, check Mag level today.      Relevant Orders   Magnesium    Other Visit Diagnoses    Routine general medical examination at a health care facility    -  Primary   Relevant Orders   Comprehensive metabolic panel   Lipid Panel With LDL/HDL Ratio   PSA   TSH   CBC with Differential/Platelet       Discussed aspirin prophylaxis for myocardial infarction prevention and decision was made to continue ASA  LABORATORY TESTING:  Health maintenance labs ordered today as discussed above.   The natural history of prostate cancer and ongoing controversy regarding screening and potential treatment outcomes of prostate cancer has been discussed with the patient. The meaning of a false positive PSA and a false negative PSA has been discussed. He indicates understanding of the limitations of this screening test and wishes to proceed with screening PSA testing.   IMMUNIZATIONS:   - Tdap:  Tetanus vaccination status reviewed: last tetanus booster within 10 years. - Influenza: Up to date - Pneumovax: Up to date - Prevnar: Up to date - Zostavax vaccine: wishes to  think about it  SCREENING: - Colonoscopy: Up to date  Discussed with patient purpose of the colonoscopy is to detect colon cancer at curable precancerous or early stages   - AAA Screening: Not applicable  -Hearing Test: Not applicable  -Spirometry: Not applicable   PATIENT COUNSELING:    Sexuality: Discussed sexually transmitted diseases, partner selection, use of condoms, avoidance of unintended pregnancy  and contraceptive alternatives.   Advised to avoid cigarette smoking.  I discussed with the patient that most people either abstain from alcohol or drink within safe limits (<=14/week and <=4 drinks/occasion for males, <=7/weeks and <= 3 drinks/occasion for females) and that the risk for alcohol disorders and other health effects rises proportionally with the number of drinks per week and how often a drinker exceeds daily limits.  Discussed cessation/primary prevention of drug use and availability of treatment for abuse.   Diet: Encouraged to adjust caloric intake to maintain  or achieve ideal body weight, to reduce intake of dietary saturated fat and total fat, to limit sodium intake by avoiding high sodium foods and not adding table salt, and to maintain adequate dietary potassium and calcium preferably from fresh fruits, vegetables, and low-fat dairy products.    Stressed the importance of regular exercise  Injury prevention: Discussed safety belts, safety helmets, smoke detector, smoking near bedding or upholstery.   Dental health: Discussed importance of regular tooth brushing, flossing, and dental visits.   Follow up plan: NEXT PREVENTATIVE PHYSICAL DUE IN 1 YEAR. Return in about 6 months (around 09/02/2020) for HTN/HLD, RA -- meet new PCP in office.

## 2020-03-04 NOTE — Assessment & Plan Note (Signed)
Chronic, ongoing.  Has poorly tolerated all trials of statins due to myalgias and his RA.  Lipid panel today.  Will consider Repatha, provided information on this for him to review and he will alert provider if he wishes to trial this.  Has family history of stroke and heart issues, would benefit from LDL lowering.

## 2020-03-04 NOTE — Assessment & Plan Note (Signed)
Chronic, stable with BP at goal today and home readings at goal. Continue current medication regimen and adjust as needed.  Recommend he check BP at home at least a few days a week and document.  CMP and TSH today.  Refills sent in.  Return in 6 months for follow-up.

## 2020-03-04 NOTE — Assessment & Plan Note (Signed)
Chronic, ongoing.  Continue collaboration with rheumatology and current regimen as ordered by them.  CBC and CMP today. 

## 2020-03-04 NOTE — Assessment & Plan Note (Signed)
Ongoing, continue daily supplement and check Vit D level today. 

## 2020-03-04 NOTE — Assessment & Plan Note (Signed)
Chronic, stable.  Recommend he monitor skin for breakdown and notify provider if present + gentle skin cleansing at home.

## 2020-03-04 NOTE — Patient Instructions (Signed)
Evolocumab injection What is this medicine? EVOLOCUMAB (e voe LOK ue mab) is known as a PCSK9 inhibitor. It is used to lower the level of cholesterol in the blood. It may be used alone or in combination with other cholesterol-lowering drugs. This drug may also be used to reduce the risk of heart attack, stroke, and certain types of heart surgery in patients with heart disease. This medicine may be used for other purposes; ask your health care provider or pharmacist if you have questions. COMMON BRAND NAME(S): Repatha What should I tell my health care provider before I take this medicine? They need to know if you have any of these conditions:  an unusual or allergic reaction to evolocumab, other medicines, latex, foods, dyes, or preservatives  pregnant or trying to get pregnant  breast-feeding How should I use this medicine? This medicine is for injection under the skin. You will be taught how to prepare and give this medicine. Use exactly as directed. Take your medicine at regular intervals. Do not take your medicine more often than directed. It is important that you put your used needles and syringes in a special sharps container. Do not put them in a trash can. If you do not have a sharps container, call your pharmacist or health care provider to get one. Talk to your pediatrician regarding the use of this medicine in children. While this drug may be prescribed for children as young as 13 years for selected conditions, precautions do apply. Overdosage: If you think you have taken too much of this medicine contact a poison control center or emergency room at once. NOTE: This medicine is only for you. Do not share this medicine with others. What if I miss a dose? If you miss a dose, take it as soon as you can if there are more than 7 days until the next scheduled dose, or skip the missed dose and take the next dose according to your original schedule. Do not take double or extra doses. What may  interact with this medicine? Interactions are not expected. This list may not describe all possible interactions. Give your health care provider a list of all the medicines, herbs, non-prescription drugs, or dietary supplements you use. Also tell them if you smoke, drink alcohol, or use illegal drugs. Some items may interact with your medicine. What should I watch for while using this medicine? Visit your health care provider for regular checks on your progress. Tell your health care provider if your symptoms do not start to get better or if they get worse. You may need blood work done while you are taking this drug. Do not wear the on-body infuser during an MRI. What side effects may I notice from receiving this medicine? Side effects that you should report to your doctor or health care professional as soon as possible:  allergic reactions like skin rash, itching or hives, swelling of the face, lips, or tongue  signs and symptoms of high blood sugar such as dizziness; dry mouth; dry skin; fruity breath; nausea; stomach pain; increased hunger or thirst; increased urination  signs and symptoms of infection like fever or chills; cough; sore throat; pain or trouble passing urine Side effects that usually do not require medical attention (report to your doctor or health care professional if they continue or are bothersome):  diarrhea  nausea  muscle pain  pain, redness, or irritation at site where injected This list may not describe all possible side effects. Call your doctor for medical   advice about side effects. You may report side effects to FDA at 1-800-FDA-1088. Where should I keep my medicine? Keep out of the reach of children. You will be instructed on how to store this medicine. Throw away any unused medicine after the expiration date on the label. NOTE: This sheet is a summary. It may not cover all possible information. If you have questions about this medicine, talk to your doctor,  pharmacist, or health care provider.  2020 Elsevier/Gold Standard (2019-03-10 16:22:29)  

## 2020-03-04 NOTE — Assessment & Plan Note (Signed)
Recent labs improved.  Recheck CMP today and continue Losartan for kidney protection.  Educated patient.

## 2020-03-05 LAB — COMPREHENSIVE METABOLIC PANEL
ALT: 22 IU/L (ref 0–44)
AST: 22 IU/L (ref 0–40)
Albumin/Globulin Ratio: 2.2 (ref 1.2–2.2)
Albumin: 4.4 g/dL (ref 3.8–4.8)
Alkaline Phosphatase: 82 IU/L (ref 44–121)
BUN/Creatinine Ratio: 14 (ref 10–24)
BUN: 17 mg/dL (ref 8–27)
Bilirubin Total: 0.7 mg/dL (ref 0.0–1.2)
CO2: 21 mmol/L (ref 20–29)
Calcium: 9 mg/dL (ref 8.6–10.2)
Chloride: 106 mmol/L (ref 96–106)
Creatinine, Ser: 1.21 mg/dL (ref 0.76–1.27)
GFR calc Af Amer: 71 mL/min/{1.73_m2} (ref >59)
GFR calc non Af Amer: 61 mL/min/{1.73_m2} (ref >59)
Globulin, Total: 2 g/dL (ref 1.5–4.5)
Glucose: 83 mg/dL (ref 65–99)
Potassium: 4.3 mmol/L (ref 3.5–5.2)
Sodium: 140 mmol/L (ref 134–144)
Total Protein: 6.4 g/dL (ref 6.0–8.5)

## 2020-03-05 LAB — CBC WITH DIFFERENTIAL/PLATELET
Basophils Absolute: 0.1 10*3/uL (ref 0.0–0.2)
Basos: 1 %
EOS (ABSOLUTE): 0.1 10*3/uL (ref 0.0–0.4)
Eos: 2 %
Hematocrit: 41.7 % (ref 37.5–51.0)
Hemoglobin: 14.3 g/dL (ref 13.0–17.7)
Immature Grans (Abs): 0 10*3/uL (ref 0.0–0.1)
Immature Granulocytes: 0 %
Lymphocytes Absolute: 2.5 10*3/uL (ref 0.7–3.1)
Lymphs: 34 %
MCH: 32.9 pg (ref 26.6–33.0)
MCHC: 34.3 g/dL (ref 31.5–35.7)
MCV: 96 fL (ref 79–97)
Monocytes Absolute: 0.5 10*3/uL (ref 0.1–0.9)
Monocytes: 7 %
Neutrophils Absolute: 4 10*3/uL (ref 1.4–7.0)
Neutrophils: 56 %
Platelets: 203 10*3/uL (ref 150–450)
RBC: 4.35 x10E6/uL (ref 4.14–5.80)
RDW: 13.2 % (ref 11.6–15.4)
WBC: 7.1 10*3/uL (ref 3.4–10.8)

## 2020-03-05 LAB — LIPID PANEL WITH LDL/HDL RATIO
Cholesterol, Total: 151 mg/dL (ref 100–199)
HDL: 38 mg/dL — ABNORMAL LOW (ref >39)
LDL Chol Calc (NIH): 81 mg/dL (ref 0–99)
LDL/HDL Ratio: 2.1 ratio (ref 0.0–3.6)
Triglycerides: 185 mg/dL — ABNORMAL HIGH (ref 0–149)
VLDL Cholesterol Cal: 32 mg/dL (ref 5–40)

## 2020-03-05 LAB — PSA: Prostate Specific Ag, Serum: 0.7 ng/mL (ref 0.0–4.0)

## 2020-03-05 LAB — MAGNESIUM: Magnesium: 1.8 mg/dL (ref 1.6–2.3)

## 2020-03-05 LAB — VITAMIN D 25 HYDROXY (VIT D DEFICIENCY, FRACTURES): Vit D, 25-Hydroxy: 70.1 ng/mL (ref 30.0–100.0)

## 2020-03-05 LAB — TSH: TSH: 2.48 u[IU]/mL (ref 0.450–4.500)

## 2020-03-06 NOTE — Progress Notes (Signed)
Contacted via [facility] Paul Rodriguez, your labs have returned and overall look great.  No medication changes needed at this time.  Any questions? Keep being awesome!!  Thank you for allowing me to participate in your care. Kindest regards, Sonnet Rizor

## 2020-03-18 ENCOUNTER — Other Ambulatory Visit: Payer: Self-pay | Admitting: Nurse Practitioner

## 2020-03-22 DIAGNOSIS — M5136 Other intervertebral disc degeneration, lumbar region: Secondary | ICD-10-CM | POA: Diagnosis not present

## 2020-03-22 DIAGNOSIS — M9901 Segmental and somatic dysfunction of cervical region: Secondary | ICD-10-CM | POA: Diagnosis not present

## 2020-03-22 DIAGNOSIS — M5033 Other cervical disc degeneration, cervicothoracic region: Secondary | ICD-10-CM | POA: Diagnosis not present

## 2020-03-22 DIAGNOSIS — M9903 Segmental and somatic dysfunction of lumbar region: Secondary | ICD-10-CM | POA: Diagnosis not present

## 2020-04-26 DIAGNOSIS — M5033 Other cervical disc degeneration, cervicothoracic region: Secondary | ICD-10-CM | POA: Diagnosis not present

## 2020-04-26 DIAGNOSIS — M5136 Other intervertebral disc degeneration, lumbar region: Secondary | ICD-10-CM | POA: Diagnosis not present

## 2020-04-26 DIAGNOSIS — M9903 Segmental and somatic dysfunction of lumbar region: Secondary | ICD-10-CM | POA: Diagnosis not present

## 2020-04-26 DIAGNOSIS — M9902 Segmental and somatic dysfunction of thoracic region: Secondary | ICD-10-CM | POA: Diagnosis not present

## 2020-04-28 DIAGNOSIS — H57813 Brow ptosis, bilateral: Secondary | ICD-10-CM | POA: Diagnosis not present

## 2020-05-02 DIAGNOSIS — M5136 Other intervertebral disc degeneration, lumbar region: Secondary | ICD-10-CM | POA: Diagnosis not present

## 2020-05-02 DIAGNOSIS — M5033 Other cervical disc degeneration, cervicothoracic region: Secondary | ICD-10-CM | POA: Diagnosis not present

## 2020-05-02 DIAGNOSIS — M9902 Segmental and somatic dysfunction of thoracic region: Secondary | ICD-10-CM | POA: Diagnosis not present

## 2020-05-02 DIAGNOSIS — M9903 Segmental and somatic dysfunction of lumbar region: Secondary | ICD-10-CM | POA: Diagnosis not present

## 2020-05-04 DIAGNOSIS — M5033 Other cervical disc degeneration, cervicothoracic region: Secondary | ICD-10-CM | POA: Diagnosis not present

## 2020-05-04 DIAGNOSIS — M9902 Segmental and somatic dysfunction of thoracic region: Secondary | ICD-10-CM | POA: Diagnosis not present

## 2020-05-04 DIAGNOSIS — M9903 Segmental and somatic dysfunction of lumbar region: Secondary | ICD-10-CM | POA: Diagnosis not present

## 2020-05-04 DIAGNOSIS — M5136 Other intervertebral disc degeneration, lumbar region: Secondary | ICD-10-CM | POA: Diagnosis not present

## 2020-05-09 DIAGNOSIS — M9903 Segmental and somatic dysfunction of lumbar region: Secondary | ICD-10-CM | POA: Diagnosis not present

## 2020-05-09 DIAGNOSIS — M5033 Other cervical disc degeneration, cervicothoracic region: Secondary | ICD-10-CM | POA: Diagnosis not present

## 2020-05-09 DIAGNOSIS — M5136 Other intervertebral disc degeneration, lumbar region: Secondary | ICD-10-CM | POA: Diagnosis not present

## 2020-05-09 DIAGNOSIS — M9902 Segmental and somatic dysfunction of thoracic region: Secondary | ICD-10-CM | POA: Diagnosis not present

## 2020-05-10 DIAGNOSIS — M5441 Lumbago with sciatica, right side: Secondary | ICD-10-CM | POA: Diagnosis not present

## 2020-05-10 DIAGNOSIS — M1611 Unilateral primary osteoarthritis, right hip: Secondary | ICD-10-CM | POA: Diagnosis not present

## 2020-05-10 DIAGNOSIS — Z20822 Contact with and (suspected) exposure to covid-19: Secondary | ICD-10-CM | POA: Diagnosis not present

## 2020-05-10 DIAGNOSIS — M5136 Other intervertebral disc degeneration, lumbar region: Secondary | ICD-10-CM | POA: Diagnosis not present

## 2020-05-10 DIAGNOSIS — G8929 Other chronic pain: Secondary | ICD-10-CM | POA: Diagnosis not present

## 2020-05-10 DIAGNOSIS — E669 Obesity, unspecified: Secondary | ICD-10-CM | POA: Diagnosis not present

## 2020-05-10 DIAGNOSIS — M545 Low back pain, unspecified: Secondary | ICD-10-CM | POA: Diagnosis not present

## 2020-05-11 DIAGNOSIS — X32XXXA Exposure to sunlight, initial encounter: Secondary | ICD-10-CM | POA: Diagnosis not present

## 2020-05-11 DIAGNOSIS — L821 Other seborrheic keratosis: Secondary | ICD-10-CM | POA: Diagnosis not present

## 2020-05-11 DIAGNOSIS — L570 Actinic keratosis: Secondary | ICD-10-CM | POA: Diagnosis not present

## 2020-05-31 DIAGNOSIS — M1990 Unspecified osteoarthritis, unspecified site: Secondary | ICD-10-CM | POA: Diagnosis not present

## 2020-05-31 DIAGNOSIS — Z79899 Other long term (current) drug therapy: Secondary | ICD-10-CM | POA: Diagnosis not present

## 2020-06-17 DIAGNOSIS — H57813 Brow ptosis, bilateral: Secondary | ICD-10-CM | POA: Insufficient documentation

## 2020-06-17 DIAGNOSIS — H02834 Dermatochalasis of left upper eyelid: Secondary | ICD-10-CM | POA: Insufficient documentation

## 2020-06-17 DIAGNOSIS — H02831 Dermatochalasis of right upper eyelid: Secondary | ICD-10-CM | POA: Insufficient documentation

## 2020-06-21 HISTORY — PX: OTHER SURGICAL HISTORY: SHX169

## 2020-08-08 DIAGNOSIS — H02834 Dermatochalasis of left upper eyelid: Secondary | ICD-10-CM | POA: Diagnosis not present

## 2020-08-08 DIAGNOSIS — H02831 Dermatochalasis of right upper eyelid: Secondary | ICD-10-CM | POA: Diagnosis not present

## 2020-08-08 DIAGNOSIS — M1990 Unspecified osteoarthritis, unspecified site: Secondary | ICD-10-CM | POA: Diagnosis not present

## 2020-08-08 DIAGNOSIS — I10 Essential (primary) hypertension: Secondary | ICD-10-CM | POA: Diagnosis not present

## 2020-08-08 DIAGNOSIS — H57813 Brow ptosis, bilateral: Secondary | ICD-10-CM | POA: Diagnosis not present

## 2020-08-29 DIAGNOSIS — M1990 Unspecified osteoarthritis, unspecified site: Secondary | ICD-10-CM | POA: Diagnosis not present

## 2020-08-29 DIAGNOSIS — Z79899 Other long term (current) drug therapy: Secondary | ICD-10-CM | POA: Diagnosis not present

## 2020-09-05 DIAGNOSIS — M5431 Sciatica, right side: Secondary | ICD-10-CM | POA: Diagnosis not present

## 2020-09-05 DIAGNOSIS — Z79899 Other long term (current) drug therapy: Secondary | ICD-10-CM | POA: Diagnosis not present

## 2020-09-05 DIAGNOSIS — M353 Polymyalgia rheumatica: Secondary | ICD-10-CM | POA: Diagnosis not present

## 2020-09-05 DIAGNOSIS — M159 Polyosteoarthritis, unspecified: Secondary | ICD-10-CM | POA: Diagnosis not present

## 2020-09-05 DIAGNOSIS — M1990 Unspecified osteoarthritis, unspecified site: Secondary | ICD-10-CM | POA: Diagnosis not present

## 2020-09-10 DIAGNOSIS — Z23 Encounter for immunization: Secondary | ICD-10-CM | POA: Diagnosis not present

## 2020-09-14 DIAGNOSIS — M5416 Radiculopathy, lumbar region: Secondary | ICD-10-CM | POA: Diagnosis not present

## 2020-09-14 DIAGNOSIS — M5136 Other intervertebral disc degeneration, lumbar region: Secondary | ICD-10-CM | POA: Diagnosis not present

## 2020-09-19 ENCOUNTER — Other Ambulatory Visit: Payer: Self-pay | Admitting: Nurse Practitioner

## 2020-09-19 ENCOUNTER — Telehealth: Payer: Medicare Other | Admitting: Family

## 2020-09-19 DIAGNOSIS — U071 COVID-19: Secondary | ICD-10-CM

## 2020-09-19 MED ORDER — DEXAMETHASONE 6 MG PO TABS: 6.0000 mg | ORAL_TABLET | Freq: Two times a day (BID) | ORAL | 0 refills | Status: DC

## 2020-09-19 MED ORDER — BENZONATATE 100 MG PO CAPS: 100.0000 mg | ORAL_CAPSULE | Freq: Three times a day (TID) | ORAL | 0 refills | Status: DC | PRN

## 2020-09-19 MED ORDER — FLUTICASONE PROPIONATE 50 MCG/ACT NA SUSP: 2.0000 | Freq: Every day | NASAL | 6 refills | Status: DC

## 2020-09-19 NOTE — Progress Notes (Signed)
E-Visit for Positive Covid Test Result We are sorry you are not feeling well. We are here to help!  You have tested positive for COVID-19, meaning that you were infected with the novel coronavirus and could give the virus to others.  It is vitally important that you stay home so you do not spread it to others.      Please continue isolation at home, for at least 10 days since the start of your symptoms and until you have had 24 hours with no fever (without taking a fever reducer) and with improving of symptoms.  If you have no symptoms but tested positive (or all symptoms resolve after 5 days and you have no fever) you can leave your house but continue to wear a mask around others for an additional 5 days. If you have a fever,continue to stay home until you have had 24 hours of no fever. Most cases improve 5-10 days from onset but we have seen a small number of patients who have gotten worse after the 10 days.  Please be sure to watch for worsening symptoms and remain taking the proper precautions.   Go to the nearest hospital ED for assessment if fever/cough/breathlessness are severe or illness seems like a threat to life.    The following symptoms may appear 2-14 days after exposure: . Fever . Cough . Shortness of breath or difficulty breathing . Chills . Repeated shaking with chills . Muscle pain . Headache . Sore throat . New loss of taste or smell . Fatigue . Congestion or runny nose . Nausea or vomiting . Diarrhea  You have been enrolled in [facility] for COVID-19. Daily you will receive a questionnaire within the [facility] website. Our COVID-19 response team will be monitoring your responses daily.  You can use medication such as prescription cough medication called Tessalon Perles 100 mg. You may take 1-2 capsules every 8 hours as needed for cough,  prescription inhaler called Albuterol MDI 90 mcg /actuation 2 puffs every 4 hours as needed for shortness of breath,  wheezing, cough and prescription for Fluticasone nasal spray 2 sprays in each nostril one time per day and dexamethasone 6 MG twice a day.   You may also take acetaminophen (Tylenol) as needed for fever.  HOME CARE: . Only take medications as instructed by your medical team. . Drink plenty of fluids and get plenty of rest. . A steam or ultrasonic humidifier can help if you have congestion.   GET HELP RIGHT AWAY IF YOU HAVE EMERGENCY WARNING SIGNS.  Call 911 or proceed to your closest emergency facility if: . You develop worsening high fever. . Trouble breathing . Bluish lips or face . Persistent pain or pressure in the chest . New confusion . Inability to wake or stay awake . You cough up blood. . Your symptoms become more severe . Inability to hold down food or fluids  This list is not all possible symptoms. Contact your medical provider for any symptoms that are severe or concerning to you.    Your e-visit answers were reviewed by a board certified advanced clinical practitioner to complete your personal care plan.  Depending on the condition, your plan could have included both over the counter or prescription medications.  If there is a problem please reply once you have received a response from your provider.  Your safety is important to [place].  If you have drug allergies check your prescription carefully.    You can use MyChart  to ask questions about today's visit, request a non-urgent call back, or ask for a work or school excuse for 24 hours related to this e-Visit. If it has been greater than 24 hours you will need to follow up with your provider, or enter a new e-Visit to address those concerns. You will get an e-mail in the next two days asking about your experience.  I hope that your e-visit has been valuable and will speed your recovery. Thank you for using e-visits.     Approximately 5 minutes was spent documenting and reviewing patient's chart.

## 2020-09-19 NOTE — Telephone Encounter (Signed)
Pt tested positive today for covid scheduled apt for 10/11/2020 for medication refills couldn't be seen sooner as he has covid states he has enough medication for apt and has already had virtual apt for covid.

## 2020-09-19 NOTE — Telephone Encounter (Signed)
Requested medication (s) are due for refill today: yes   Requested medication (s) are on the active medication list: yes  Last refill:  06/25/2020  Future visit scheduled:no  Notes to clinic: Patient due for a 6 month follow up on 09/02/2020   Requested Prescriptions  Pending Prescriptions Disp Refills   losartan (COZAAR) 100 MG tablet [Pharmacy Med Name: LOSARTAN POTASSIUM 100 MG TAB] 90 tablet 1    Sig: TAKE 1 TABLET BY MOUTH EVERY DAY      Cardiovascular:  Angiotensin Receptor Blockers Failed - 09/19/2020  1:34 AM      Failed - Cr in normal range and within 180 days    Creatinine, Ser  Date Value Ref Range Status  03/04/2020 1.21 0.76 - 1.27 mg/dL Final          Failed - K in normal range and within 180 days    Potassium  Date Value Ref Range Status  03/04/2020 4.3 3.5 - 5.2 mmol/L Final          Failed - Valid encounter within last 6 months    Recent Outpatient Visits           6 months ago Routine general medical examination at a health care facility   [place], Henrine Screws T, NP   1 year ago Essential hypertension   [place], Henrine Screws T, NP   1 year ago Sinusitis, unspecified chronicity, unspecified location   [facility] [place], Jeannette How, MD   1 year ago Annual physical exam   [place], Barbaraann Faster, NP   1 year ago Essential hypertension   [place], MD       Future Appointments             In 4 months [place], [place] - Patient is not pregnant      Passed - Last BP in normal range    BP Readings from Last 1 Encounters:  03/04/20 131/85

## 2020-09-20 NOTE — Progress Notes (Signed)
Please see patient questionannaire.

## 2020-09-24 DIAGNOSIS — Z20822 Contact with and (suspected) exposure to covid-19: Secondary | ICD-10-CM | POA: Diagnosis not present

## 2020-09-28 ENCOUNTER — Telehealth: Payer: Self-pay

## 2020-09-28 ENCOUNTER — Encounter (INDEPENDENT_AMBULATORY_CARE_PROVIDER_SITE_OTHER): Payer: Self-pay

## 2020-09-28 NOTE — Telephone Encounter (Signed)
Patient states that he is very fatigue and has some congestion. Patient doesn't have any severe sob but feels like it takes a little more breaths to do things. Patient does still have appetite and has been able to drink fluids. Patient was advise per protocols for worsening symptoms. Patient agrees with the plan and verbalized understanding.     Shortness of breath is the same: continue to monitor at home   If symptoms become severe, i.e. shortness of breath at rest, gasping for air, wheezing, CALL 911 AND SEEK TREATMENT IN THE ED    If appetite becomes worse: encourage patient to drink fluids as tolerated, work their way up to bland solid food such as crackers, pretzels, soup, bread or applesauce and boiled starches.   If patient is unable to tolerate any foods or liquids, notify PCP.   IF PATIENT DEVELOPS SEVERE VOMITING (MORE THAN 6 TIMES A DAY AND OR >8 HOURS) AND/OR SEVERE ABDOMINAL PAIN ADVISE PATIENT TO CALL 911 AND SEEK TREATMENT IN ED   IF PATIENT HAS WORSENING WEAKNESS WITH INABILITY TO STAND OR IF PATIENT HAS TO HOLD ON TO SOMETHING TO GET BALANCE, ADVISE PATIENT TO CALL 911 AND SEEK TREATMENT IN ED

## 2020-09-29 ENCOUNTER — Telehealth: Payer: Medicare Other | Admitting: Physician Assistant

## 2020-09-29 ENCOUNTER — Encounter: Payer: Self-pay | Admitting: Family Medicine

## 2020-09-29 ENCOUNTER — Ambulatory Visit
Admission: RE | Admit: 2020-09-29 | Discharge: 2020-09-29 | Disposition: A | Discharge status: Home / Self Care | Payer: Medicare Other | Attending: Family Medicine | Admitting: Family Medicine

## 2020-09-29 ENCOUNTER — Ambulatory Visit
Admission: RE | Admit: 2020-09-29 | Discharge: 2020-09-29 | Disposition: A | Discharge status: Home / Self Care | Payer: Medicare Other | Source: Ambulatory Visit | Attending: Family Medicine | Admitting: Family Medicine

## 2020-09-29 ENCOUNTER — Other Ambulatory Visit: Payer: Self-pay

## 2020-09-29 ENCOUNTER — Telehealth: Payer: Self-pay | Admitting: *Deleted

## 2020-09-29 ENCOUNTER — Telehealth (INDEPENDENT_AMBULATORY_CARE_PROVIDER_SITE_OTHER): Payer: Medicare Other | Admitting: Family Medicine

## 2020-09-29 DIAGNOSIS — U071 COVID-19: Secondary | ICD-10-CM

## 2020-09-29 DIAGNOSIS — R0602 Shortness of breath: Secondary | ICD-10-CM | POA: Diagnosis not present

## 2020-09-29 DIAGNOSIS — R21 Rash and other nonspecific skin eruption: Secondary | ICD-10-CM | POA: Diagnosis not present

## 2020-09-29 IMAGING — DX DG CHEST 2V
4 series · 4 of 4 positions shown · non-contrast
Comparison: Chest radiograph dated [DATE].

CLINICAL DATA: 69-year-old male with shortness of breath.

EXAM:
CHEST - 2 VIEW

[chest pa (1 of 2)]
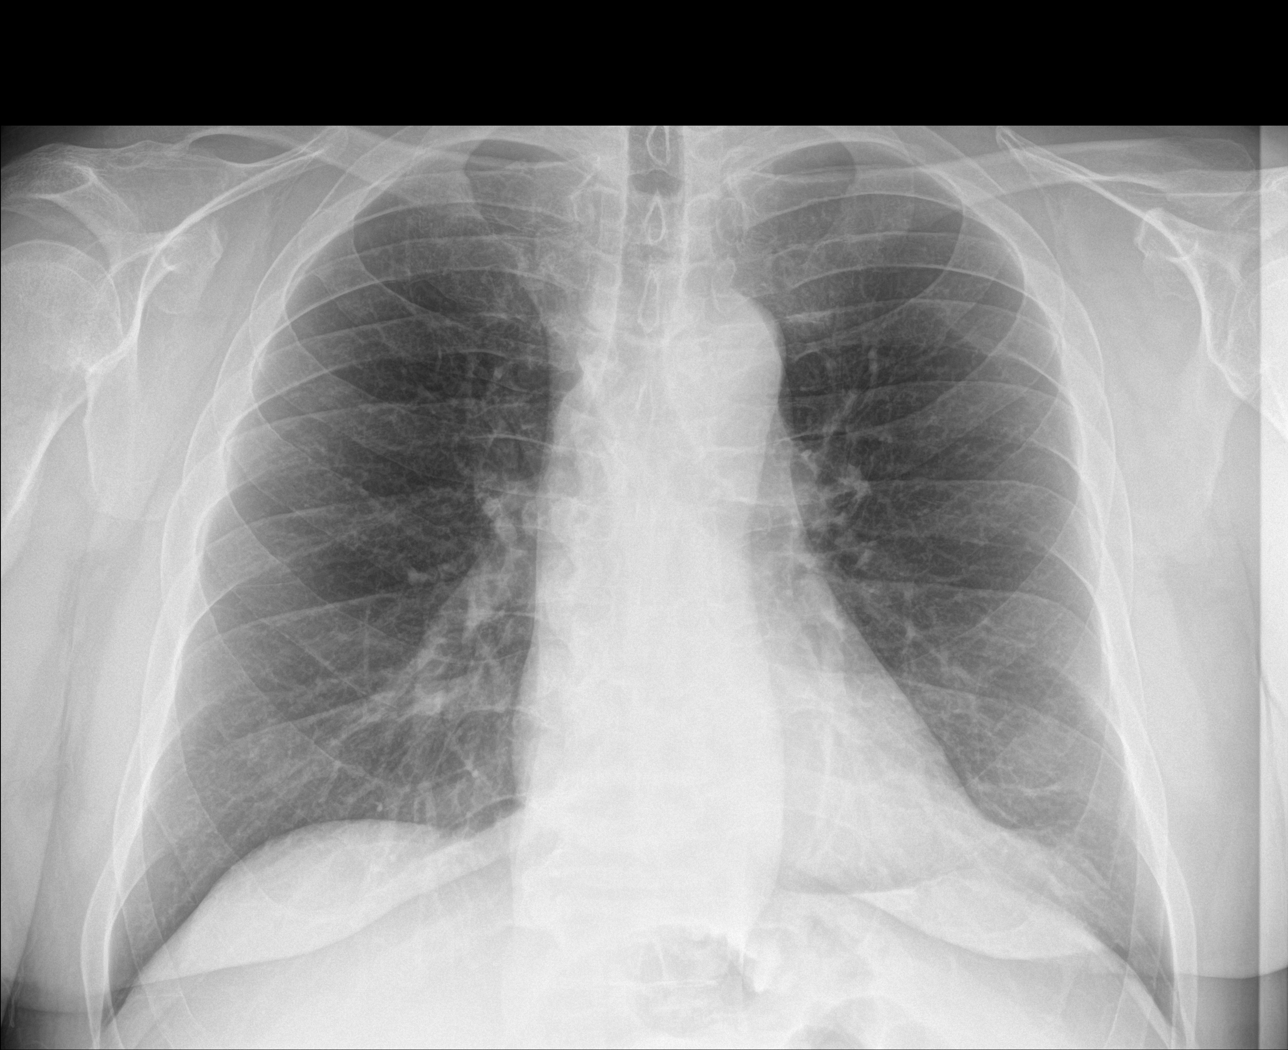

[chest lat (1 of 2)]
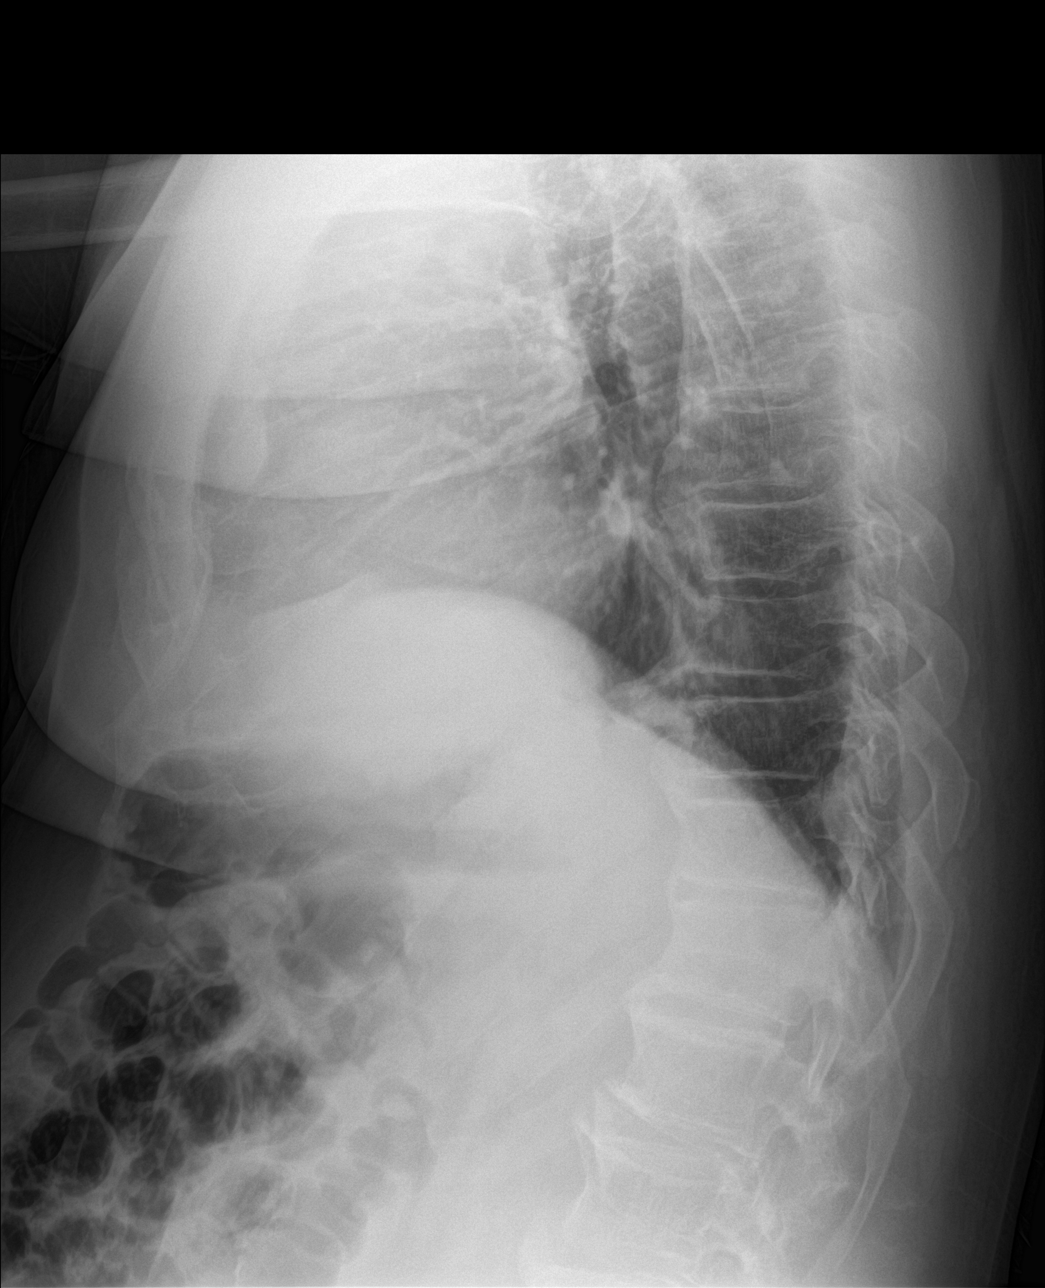

[chest pa (2 of 2)]
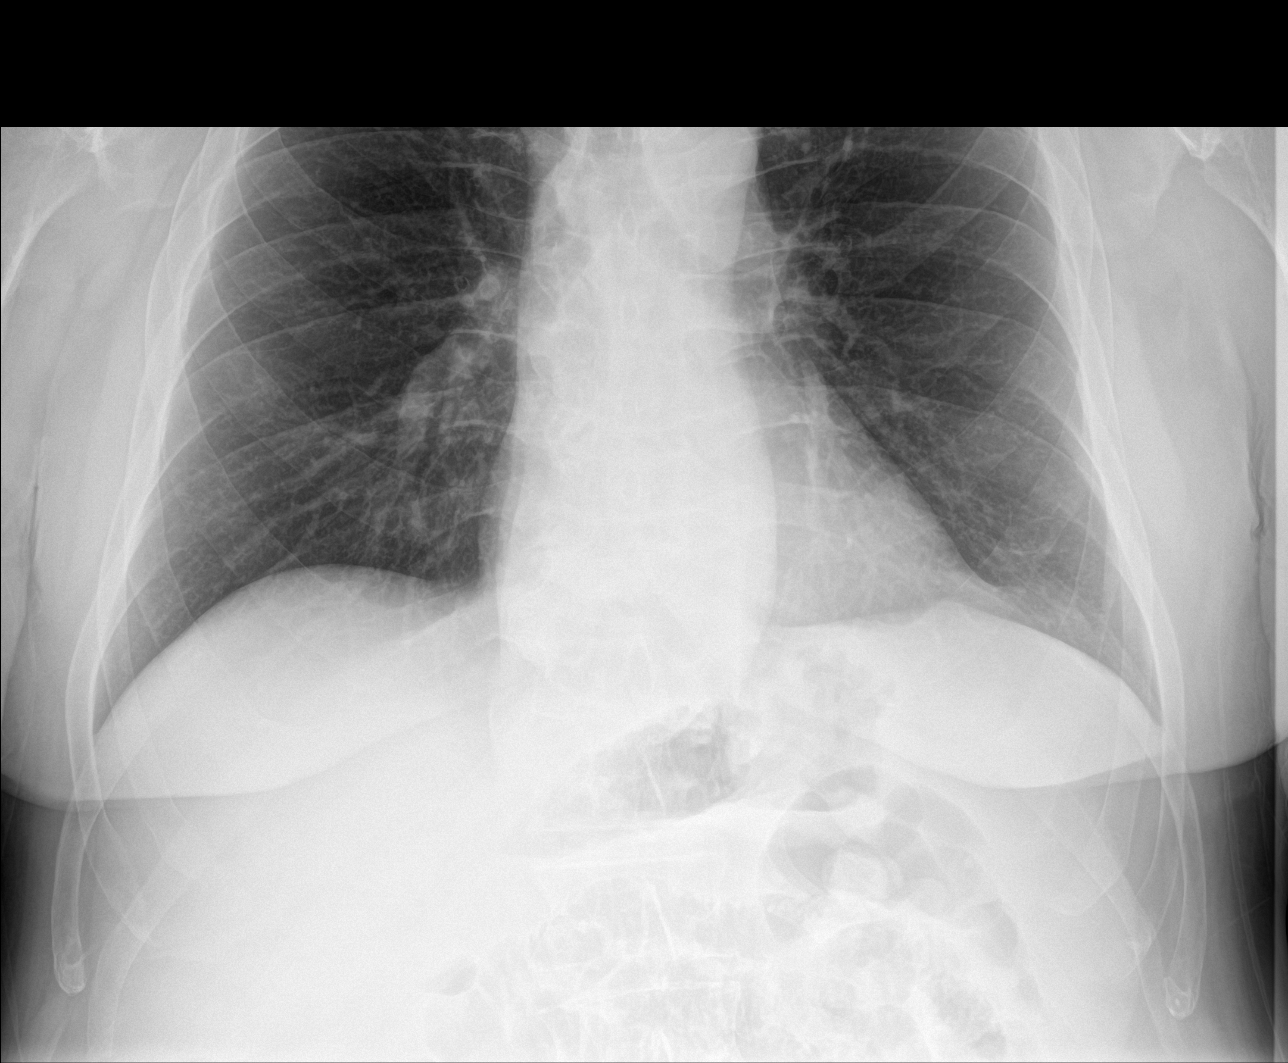

[chest lat (2 of 2)]
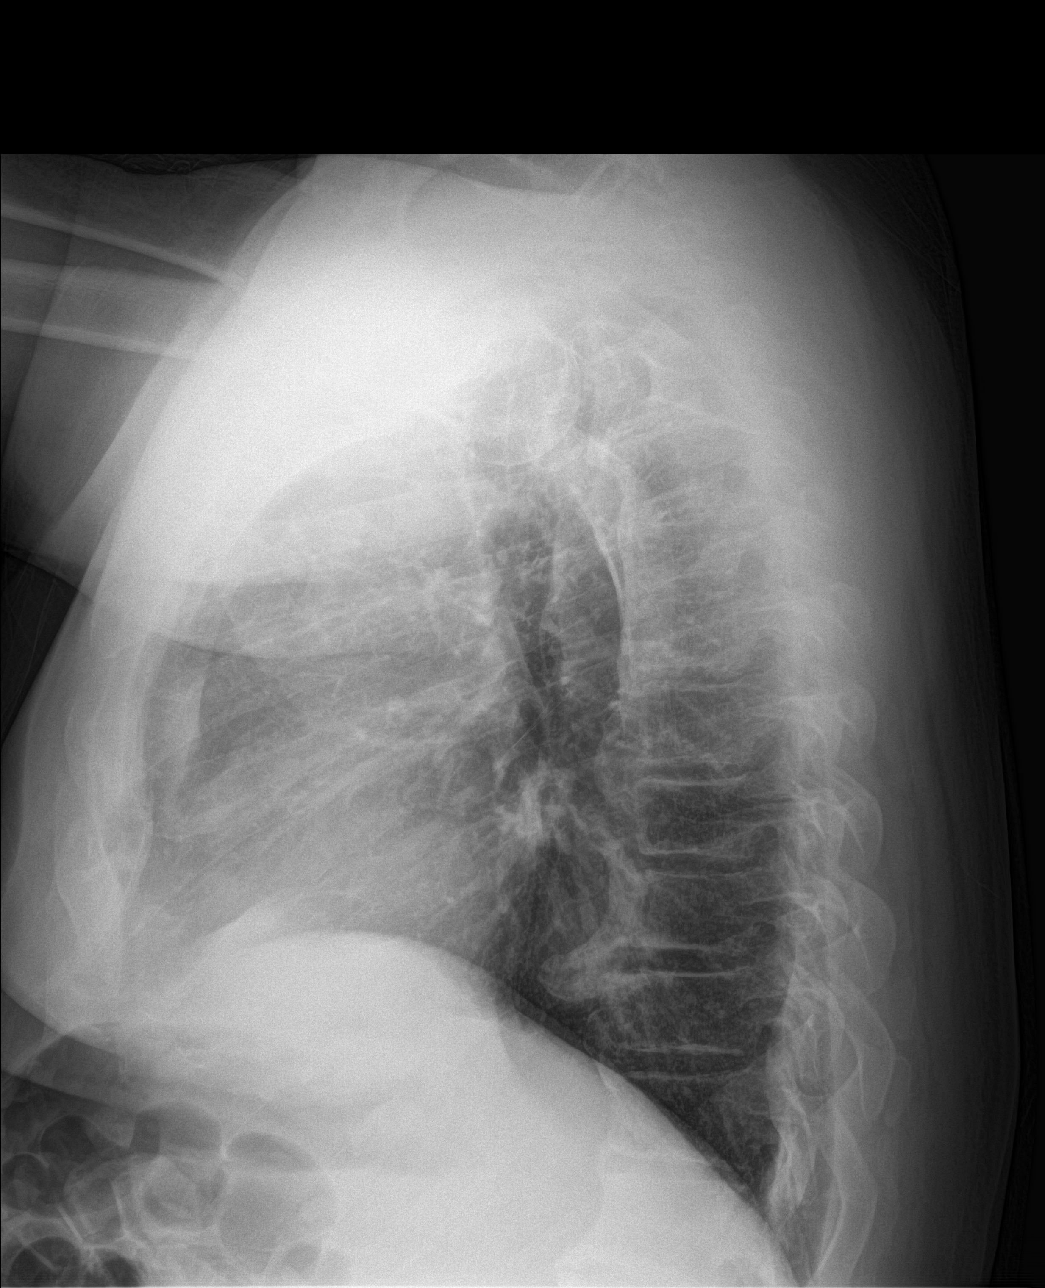

[4 of 4 positions shown; findings below may reference images not displayed]

FINDINGS: The lungs are clear. There is no pleural effusion or pneumothorax.
The cardiac silhouette is within limits. No acute osseous pathology.
Degenerative changes of the spine.
IMPRESSION: No active cardiopulmonary disease.

## 2020-09-29 MED ORDER — HYDROCOD POLST-CPM POLST ER 10-8 MG/5ML PO SUER: 5.0000 mL | Freq: Two times a day (BID) | ORAL | 0 refills | Status: DC | PRN

## 2020-09-29 MED ORDER — PREDNISONE 10 MG PO TABS: ORAL_TABLET | ORAL | 0 refills | Status: DC

## 2020-09-29 MED ORDER — BENZONATATE 200 MG PO CAPS: 200.0000 mg | ORAL_CAPSULE | Freq: Two times a day (BID) | ORAL | 0 refills | Status: DC | PRN

## 2020-09-29 NOTE — Telephone Encounter (Signed)
Patient completed Covid Questionnaire which triggered a BPA for worsening SOB and cough. Attempted to reach patient by phone-unsuccessful. Left MyChart message to call the office and schedule a virtual visit if needed. Chart indicated he has seen the message. Routing to clinic for follow-up.

## 2020-09-29 NOTE — Progress Notes (Signed)
  E-Visit for Corona Virus Screening  Based on what you have shared with me, you need to seek an evaluation for a severe illness due to [place]. Because of the significant shortness of breath, I recommend that you be seen and evaluated "face to face". If you have severe symptoms of any kind, seek medical care at an emergency room. Our Emergency Departments are best equipped to handle patients with severe symptoms.  Otherwise you can be seen at a local Urgent Care. You need examination of lungs, oxygen status, etc to make sure nothing more acute is needed.  If you are having a true medical emergency please call 911.   I recommend the following:  . [facility] Emergency Department [place], [place], [place] [address] [phone]  . [facility] Emergency Department [place], [place], [place] [address] [phone]  . [facility] Emergency Department [place], [place], [place] [address] [phone]  . [facility] Emergency Department [address] [place], [place], [place] [address] [phone]  . [facility] Emergency Department [place], [place], [place] [address] [phone]  NOTE: If you entered your credit card information for this eVisit, you will not be charged. You may see a "hold" on your card for the $35 but that hold will drop off and you will not have a charge processed.   Your e-visit answers were reviewed by a board certified advanced clinical practitioner to complete your personal care plan.  Thank you for using e-Visits.

## 2020-09-29 NOTE — Progress Notes (Signed)
There were no vitals taken for this visit.   Subjective:    Patient ID: Paul Rodriguez, male    DOB: 1951-04-07, 70 y.o.   MRN: [id number]  HPI: Paul Rodriguez is a 70 y.o. male  Chief Complaint  Patient presents with  . Covid Positive    Patient states he tested positive about 2 weeks ago on a home test, states he felt fine. Patient started feeling sick over the weekend and went to [facility] and took a PCR test and it came back positive. Patient is feeling very fatigue and feels like he can't walk too far.    COVID Duration: about 2 weeks ago, started with symptoms about 5 days ago Worst symptom: fatigue, SOB Fever: no Cough: yes Shortness of breath: yes Wheezing: yes Chest pain: no Chest tightness: no Chest congestion: no Nasal congestion: yes Runny nose: yes Post nasal drip: yes Sneezing: no Sore throat: yes Swollen glands: no Sinus pressure: yes Headache: yes Face pain: no Toothache: no Ear pain: no  Ear pressure: yes bilateral Eyes red/itching:no Eye drainage/crusting: no  Vomiting: no Rash: no Fatigue: yes Strep contacts: no  Context: better, worse, stable and fluctuating Recurrent sinusitis: no Relief with OTC cold/cough medications: no  Treatments attempted: none   Relevant past medical, surgical, family and social history reviewed and updated as indicated. Interim medical history since our last visit reviewed. Allergies and medications reviewed and updated.  Review of Systems  Constitutional: Positive for activity change, appetite change, chills and fatigue. Negative for diaphoresis, fever and unexpected weight change.  HENT: Positive for congestion, postnasal drip, rhinorrhea and sinus pressure. Negative for dental problem, drooling, ear discharge, ear pain, facial swelling, hearing loss, mouth sores, nosebleeds, sinus pain, sneezing, sore throat, tinnitus, trouble swallowing and voice change.   Eyes: Negative.   Respiratory: Positive for cough, chest  tightness and shortness of breath. Negative for apnea, choking, wheezing and stridor.   Cardiovascular: Negative.   Gastrointestinal: Negative.   Musculoskeletal: Negative.   Psychiatric/Behavioral: Negative.     Per HPI unless specifically indicated above     Objective:    There were no vitals taken for this visit.  Wt Readings from Last 3 Encounters:  03/04/20 237 lb (107.5 kg)  02/01/20 230 lb (104.3 kg)  08/03/19 238 lb (108 kg)    Physical Exam Vitals and nursing note reviewed.  Pulmonary:     Effort: Pulmonary effort is normal. No respiratory distress.     Comments: Speaking in full sentences Neurological:     Mental Status: He is alert.  Psychiatric:        Mood and Affect: Mood normal.        Behavior: Behavior normal.        Thought Content: Thought content normal.        Judgment: Judgment normal.     Results for orders placed or performed in visit on 03/04/20  Comprehensive metabolic panel  Result Value Ref Range   Glucose 83 65 - 99 mg/dL   BUN 17 8 - 27 mg/dL   Creatinine, Ser 1.21 0.76 - 1.27 mg/dL   GFR calc non Af Amer 61 >59 mL/min/1.73   GFR calc Af Amer 71 >59 mL/min/1.73   BUN/Creatinine Ratio 14 10 - 24   Sodium 140 134 - 144 mmol/L   Potassium 4.3 3.5 - 5.2 mmol/L   Chloride 106 96 - 106 mmol/L   CO2 21 20 - 29 mmol/L   Calcium 9.0 8.6 -  10.2 mg/dL   Total Protein 6.4 6.0 - 8.5 g/dL   Albumin 4.4 3.8 - 4.8 g/dL   Globulin, Total 2.0 1.5 - 4.5 g/dL   Albumin/Globulin Ratio 2.2 1.2 - 2.2   Bilirubin Total 0.7 0.0 - 1.2 mg/dL   Alkaline Phosphatase 82 44 - 121 IU/L   AST 22 0 - 40 IU/L   ALT 22 0 - 44 IU/L  Lipid Panel With LDL/HDL Ratio  Result Value Ref Range   Cholesterol, Total 151 100 - 199 mg/dL   Triglycerides 185 (H) 0 - 149 mg/dL   HDL 38 (L) >39 mg/dL   VLDL Cholesterol Cal 32 5 - 40 mg/dL   LDL Chol Calc (NIH) 81 0 - 99 mg/dL   LDL/HDL Ratio 2.1 0.0 - 3.6 ratio  PSA  Result Value Ref Range   Prostate Specific Ag, Serum 0.7  0.0 - 4.0 ng/mL  TSH  Result Value Ref Range   TSH 2.480 0.450 - 4.500 uIU/mL  CBC with Differential/Platelet  Result Value Ref Range   WBC 7.1 3.4 - 10.8 x10E3/uL   RBC 4.35 4.14 - 5.80 x10E6/uL   Hemoglobin 14.3 13.0 - 17.7 g/dL   Hematocrit 41.7 37.5 - 51.0 %   MCV 96 79 - 97 fL   MCH 32.9 26.6 - 33.0 pg   MCHC 34.3 31.5 - 35.7 g/dL   RDW 13.2 11.6 - 15.4 %   Platelets 203 150 - 450 x10E3/uL   Neutrophils 56 Not Estab. %   Lymphs 34 Not Estab. %   Monocytes 7 Not Estab. %   Eos 2 Not Estab. %   Basos 1 Not Estab. %   Neutrophils Absolute 4.0 1.4 - 7.0 x10E3/uL   Lymphocytes Absolute 2.5 0.7 - 3.1 x10E3/uL   Monocytes Absolute 0.5 0.1 - 0.9 x10E3/uL   EOS (ABSOLUTE) 0.1 0.0 - 0.4 x10E3/uL   Basophils Absolute 0.1 0.0 - 0.2 x10E3/uL   Immature Granulocytes 0 Not Estab. %   Immature Grans (Abs) 0.0 0.0 - 0.1 x10E3/uL  Magnesium  Result Value Ref Range   Magnesium 1.8 1.6 - 2.3 mg/dL  VITAMIN D 25 Hydroxy (Vit-D Deficiency, Fractures)  Result Value Ref Range   Vit D, 25-Hydroxy 70.1 30.0 - 100.0 ng/mL      Assessment & Plan:   Problem List Items Addressed This Visit   None   Visit Diagnoses    COVID-19    -  Primary   Concern for COVID pneumonia, will get CXR. Treat with prednisone, tussionex and tessalon perles. Recheck in person next week. Call with any concerns.    Relevant Orders   DG Chest 2 View       Follow up plan: Return next week.   . This visit was completed via telephone due to the restrictions of the COVID-19 pandemic. All issues as above were discussed and addressed but no physical exam was performed. If it was felt that the patient should be evaluated in the office, they were directed there. The patient verbally consented to this visit. Patient was unable to complete an audio/visual visit due to Lack of equipment. Due to the catastrophic nature of the COVID-19 pandemic, this visit was done through audio contact only. . Location of the patient:  home . Location of the provider: work . Those involved with this call:  . Provider: Park Liter, DO . CMA: Louanna Raw, [place] . Front Desk/Registration: Roe Rutherford  . Time spent on call: 21 minutes on the phone discussing  health concerns. 30 minutes total spent in review of patient's record and preparation of their chart.

## 2020-10-02 ENCOUNTER — Encounter: Payer: Self-pay | Admitting: Family Medicine

## 2020-10-05 ENCOUNTER — Ambulatory Visit: Payer: Medicare Other | Admitting: Family Medicine

## 2020-10-06 DIAGNOSIS — M5416 Radiculopathy, lumbar region: Secondary | ICD-10-CM | POA: Diagnosis not present

## 2020-10-11 ENCOUNTER — Encounter: Payer: Self-pay | Admitting: Internal Medicine

## 2020-10-11 ENCOUNTER — Ambulatory Visit (INDEPENDENT_AMBULATORY_CARE_PROVIDER_SITE_OTHER): Payer: Medicare Other | Admitting: Internal Medicine

## 2020-10-11 ENCOUNTER — Other Ambulatory Visit: Payer: Self-pay

## 2020-10-11 VITALS — BP 133/83 | HR 67 | Temp 97.2°F | Ht 68.9 in | Wt 240.0 lb

## 2020-10-11 DIAGNOSIS — Z1329 Encounter for screening for other suspected endocrine disorder: Secondary | ICD-10-CM | POA: Diagnosis not present

## 2020-10-11 DIAGNOSIS — E7800 Pure hypercholesterolemia, unspecified: Secondary | ICD-10-CM | POA: Diagnosis not present

## 2020-10-11 DIAGNOSIS — N1831 Chronic kidney disease, stage 3a: Secondary | ICD-10-CM | POA: Diagnosis not present

## 2020-10-11 DIAGNOSIS — M5416 Radiculopathy, lumbar region: Secondary | ICD-10-CM | POA: Diagnosis not present

## 2020-10-11 DIAGNOSIS — I10 Essential (primary) hypertension: Secondary | ICD-10-CM

## 2020-10-11 DIAGNOSIS — N400 Enlarged prostate without lower urinary tract symptoms: Secondary | ICD-10-CM

## 2020-10-11 NOTE — Progress Notes (Signed)
BP 133/83   Pulse 67   Temp (!) 97.2 F (36.2 C) (Oral)   Ht 5' 8.9" (1.75 m)   Wt 240 lb (108.9 kg)   SpO2 97%   BMI 35.55 kg/m    Subjective:    Patient ID: Paul Rodriguez, male    DOB: 04-15-1951, 70 y.o.   MRN: [id number]  HPI: Paul Rodriguez is a 70 y.o. male  Pt is here to establish care , has a ho HTN, BPH, HLd, RA - on   MTX 15 mg once a week not on prednisone Had COVID a few weeks ago not admitted was rx OP. Had gone to the beach and friend tested positive and hence tested for such , quarantined   has had 4 doses of COVID vaccines   Hypertension This is a chronic problem. Pertinent negatives include no chest pain, headaches, palpitations or shortness of breath.  Hyperlipidemia This is a chronic problem. Pertinent negatives include no chest pain, myalgias or shortness of breath.  Arthritis Presents for initial (RA - on   MTX 15 mg once a week not on prednisone) visit. He reports no joint swelling. Pertinent negatives include no diarrhea, dysuria, fatigue, fever or rash.    Chief Complaint  Patient presents with  . Hypertension  . Chronic Kidney Disease  . Benign Prostatic Hypertrophy  . Erectile Dysfunction  . Rheumatoid Arthritis  . Hyperlipidemia  . h/o of covid    Dx'd 3 weeks ago     Relevant past medical, surgical, family and social history reviewed and updated as indicated. Interim medical history since our last visit reviewed. Allergies and medications reviewed and updated.  Review of Systems  Constitutional: Negative for activity change, appetite change, chills, fatigue and fever.  HENT: Negative for congestion, ear discharge, ear pain and facial swelling.   Eyes: Negative for pain, discharge and itching.  Respiratory: Negative for cough, chest tightness, shortness of breath and wheezing.   Cardiovascular: Negative for chest pain, palpitations and leg swelling.  Gastrointestinal: Negative for abdominal distention, abdominal pain, blood in stool,  constipation, diarrhea, nausea and vomiting.  Endocrine: Negative for cold intolerance, heat intolerance, polydipsia, polyphagia and polyuria.  Genitourinary: Negative for difficulty urinating, dysuria, flank pain, frequency, hematuria and urgency.  Musculoskeletal: Positive for arthritis. Negative for arthralgias, gait problem, joint swelling and myalgias.  Skin: Negative for color change, rash and wound.  Neurological: Negative for dizziness, tremors, speech difficulty, weakness, light-headedness, numbness and headaches.  Hematological: Does not bruise/bleed easily.  Psychiatric/Behavioral: Negative for agitation, confusion, decreased concentration, sleep disturbance and suicidal ideas.    Per HPI unless specifically indicated above     Objective:    BP 133/83   Pulse 67   Temp (!) 97.2 F (36.2 C) (Oral)   Ht 5' 8.9" (1.75 m)   Wt 240 lb (108.9 kg)   SpO2 97%   BMI 35.55 kg/m   Wt Readings from Last 3 Encounters:  10/11/20 240 lb (108.9 kg)  03/04/20 237 lb (107.5 kg)  02/01/20 230 lb (104.3 kg)    Physical Exam Vitals and nursing note reviewed.  Constitutional:      General: He is not in acute distress.    Appearance: Normal appearance. He is not ill-appearing or diaphoretic.  HENT:     Head: Normocephalic and atraumatic.     Right Ear: Tympanic membrane and external ear normal. There is no impacted cerumen.     Left Ear: External ear normal.  Nose: No congestion or rhinorrhea.     Mouth/Throat:     Pharynx: No oropharyngeal exudate or posterior oropharyngeal erythema.  Eyes:     Conjunctiva/sclera: Conjunctivae normal.     Pupils: Pupils are equal, round, and reactive to light.  Cardiovascular:     Rate and Rhythm: Normal rate and regular rhythm.     Heart sounds: No murmur heard. No friction rub. No gallop.   Pulmonary:     Effort: No respiratory distress.     Breath sounds: No stridor. No wheezing or rhonchi.  Chest:     Chest wall: No tenderness.   Abdominal:     General: Abdomen is flat. Bowel sounds are normal.     Palpations: Abdomen is soft. There is no mass.     Tenderness: There is no abdominal tenderness.  Musculoskeletal:     Cervical back: Normal range of motion and neck supple. No rigidity or tenderness.     Left lower leg: No edema.  Skin:    General: Skin is warm and dry.  Neurological:     General: No focal deficit present.     Mental Status: He is alert and oriented to person, place, and time. Mental status is at baseline.  Psychiatric:        Mood and Affect: Mood normal.        Behavior: Behavior normal.        Thought Content: Thought content normal.        Judgment: Judgment normal.     Results for orders placed or performed in visit on 03/04/20  Comprehensive metabolic panel  Result Value Ref Range   Glucose 83 65 - 99 mg/dL   BUN 17 8 - 27 mg/dL   Creatinine, Ser 1.21 0.76 - 1.27 mg/dL   GFR calc non Af Amer 61 >59 mL/min/1.73   GFR calc Af Amer 71 >59 mL/min/1.73   BUN/Creatinine Ratio 14 10 - 24   Sodium 140 134 - 144 mmol/L   Potassium 4.3 3.5 - 5.2 mmol/L   Chloride 106 96 - 106 mmol/L   CO2 21 20 - 29 mmol/L   Calcium 9.0 8.6 - 10.2 mg/dL   Total Protein 6.4 6.0 - 8.5 g/dL   Albumin 4.4 3.8 - 4.8 g/dL   Globulin, Total 2.0 1.5 - 4.5 g/dL   Albumin/Globulin Ratio 2.2 1.2 - 2.2   Bilirubin Total 0.7 0.0 - 1.2 mg/dL   Alkaline Phosphatase 82 44 - 121 IU/L   AST 22 0 - 40 IU/L   ALT 22 0 - 44 IU/L  Lipid Panel With LDL/HDL Ratio  Result Value Ref Range   Cholesterol, Total 151 100 - 199 mg/dL   Triglycerides 185 (H) 0 - 149 mg/dL   HDL 38 (L) >39 mg/dL   VLDL Cholesterol Cal 32 5 - 40 mg/dL   LDL Chol Calc (NIH) 81 0 - 99 mg/dL   LDL/HDL Ratio 2.1 0.0 - 3.6 ratio  PSA  Result Value Ref Range   Prostate Specific Ag, Serum 0.7 0.0 - 4.0 ng/mL  TSH  Result Value Ref Range   TSH 2.480 0.450 - 4.500 uIU/mL  CBC with Differential/Platelet  Result Value Ref Range   WBC 7.1 3.4 - 10.8  x10E3/uL   RBC 4.35 4.14 - 5.80 x10E6/uL   Hemoglobin 14.3 13.0 - 17.7 g/dL   Hematocrit 41.7 37.5 - 51.0 %   MCV 96 79 - 97 fL   MCH 32.9 26.6 - 33.0 pg  MCHC 34.3 31.5 - 35.7 g/dL   RDW 13.2 11.6 - 15.4 %   Platelets 203 150 - 450 x10E3/uL   Neutrophils 56 Not Estab. %   Lymphs 34 Not Estab. %   Monocytes 7 Not Estab. %   Eos 2 Not Estab. %   Basos 1 Not Estab. %   Neutrophils Absolute 4.0 1.4 - 7.0 x10E3/uL   Lymphocytes Absolute 2.5 0.7 - 3.1 x10E3/uL   Monocytes Absolute 0.5 0.1 - 0.9 x10E3/uL   EOS (ABSOLUTE) 0.1 0.0 - 0.4 x10E3/uL   Basophils Absolute 0.1 0.0 - 0.2 x10E3/uL   Immature Granulocytes 0 Not Estab. %   Immature Grans (Abs) 0.0 0.0 - 0.1 x10E3/uL  Magnesium  Result Value Ref Range   Magnesium 1.8 1.6 - 2.3 mg/dL  VITAMIN D 25 Hydroxy (Vit-D Deficiency, Fractures)  Result Value Ref Range   Vit D, 25-Hydroxy 70.1 30.0 - 100.0 ng/mL        Current Outpatient Medications:  .  aspirin 81 MG tablet, Take 81 mg by mouth daily., Disp: , Rfl:  .  Cholecalciferol (VITAMIN D3) 25 MCG (1000 UT) CAPS, Take 3,000 Units by mouth daily., Disp: , Rfl:  .  EPINEPHrine 0.3 mg/0.3 mL IJ SOAJ injection, , Disp: , Rfl:  .  fluticasone (FLONASE) 50 MCG/ACT nasal spray, Place 2 sprays into both nostrils daily., Disp: 16 g, Rfl: 6 .  FOLIC ACID PO, Take by mouth daily., Disp: , Rfl:  .  ibuprofen (ADVIL,MOTRIN) 200 MG tablet, Take 400 mg by mouth daily., Disp: , Rfl:  .  loratadine-pseudoephedrine (CLARITIN-D 12-HOUR) 5-120 MG tablet, Take by mouth every 12 (twelve) hours as needed., Disp: , Rfl:  .  losartan (COZAAR) 100 MG tablet, TAKE 1 TABLET BY MOUTH EVERY DAY, Disp: 90 tablet, Rfl: 1 .  methotrexate (RHEUMATREX) 2.5 MG tablet, Take 15 mg by mouth once a week. , Disp: , Rfl:  .  Multiple Vitamin (MULTIVITAMIN) tablet, Take 1 tablet by mouth daily., Disp: , Rfl:  .  Omega-3 Fatty Acids (FISH OIL) 1000 MG CAPS, Take 1,000 mg by mouth daily., Disp: , Rfl:  .  omeprazole  (PRILOSEC) 20 MG capsule, Take 20 mg by mouth daily., Disp: , Rfl:  .  tadalafil (CIALIS) 5 MG tablet, Take 1 tablet (5 mg total) by mouth daily as needed for erectile dysfunction., Disp: 90 tablet, Rfl: 0 .  vitamin B-12 (CYANOCOBALAMIN) 1000 MCG tablet, Take 1,000 mcg by mouth daily., Disp: , Rfl:  .  dexamethasone (DECADRON) 6 MG tablet, Take 1 tablet (6 mg total) by mouth 2 (two) times daily. (Patient not taking: No sig reported), Disp: 14 tablet, Rfl: 0    Assessment & Plan:  1. HTn Is on cozaar for such Continue current meds.  Medication compliance emphasised. pt advised to keep Bp logs. Pt verbalised understanding of the same. Pt to have a low salt diet . Exercise to reach a goal of at least 150 mins a week.  lifestyle modifications explained and pt understands importance of the above.   2. HLD:  TG high at 182   doenst take any meds now. Failed statins - recheck before next visit.  3, Sees cardiologist mom and dad had CAD when they were older. Sees Dr. Nehemiah Massed, his HDL was always low TG   4. RA chroinc, stable, asymptomatic from such currently. is on MTX q weekly sees rheumatology for such   Problem List Items Addressed This Visit   None  Follow up plan: No follow-ups on file.

## 2020-10-13 DIAGNOSIS — M5416 Radiculopathy, lumbar region: Secondary | ICD-10-CM | POA: Diagnosis not present

## 2020-10-19 DIAGNOSIS — M5416 Radiculopathy, lumbar region: Secondary | ICD-10-CM | POA: Diagnosis not present

## 2020-10-21 DIAGNOSIS — M5416 Radiculopathy, lumbar region: Secondary | ICD-10-CM | POA: Diagnosis not present

## 2020-10-22 DIAGNOSIS — B001 Herpesviral vesicular dermatitis: Secondary | ICD-10-CM | POA: Diagnosis not present

## 2020-10-24 DIAGNOSIS — M5416 Radiculopathy, lumbar region: Secondary | ICD-10-CM | POA: Diagnosis not present

## 2020-10-28 DIAGNOSIS — M5416 Radiculopathy, lumbar region: Secondary | ICD-10-CM | POA: Diagnosis not present

## 2020-11-04 DIAGNOSIS — M5416 Radiculopathy, lumbar region: Secondary | ICD-10-CM | POA: Diagnosis not present

## 2020-11-07 DIAGNOSIS — M5416 Radiculopathy, lumbar region: Secondary | ICD-10-CM | POA: Diagnosis not present

## 2020-11-09 ENCOUNTER — Other Ambulatory Visit: Payer: Self-pay | Admitting: Family Medicine

## 2020-11-09 DIAGNOSIS — M5136 Other intervertebral disc degeneration, lumbar region: Secondary | ICD-10-CM | POA: Diagnosis not present

## 2020-11-09 DIAGNOSIS — M5441 Lumbago with sciatica, right side: Secondary | ICD-10-CM | POA: Diagnosis not present

## 2020-11-09 DIAGNOSIS — M5416 Radiculopathy, lumbar region: Secondary | ICD-10-CM

## 2020-11-09 DIAGNOSIS — M5442 Lumbago with sciatica, left side: Secondary | ICD-10-CM | POA: Diagnosis not present

## 2020-11-09 DIAGNOSIS — G8929 Other chronic pain: Secondary | ICD-10-CM | POA: Diagnosis not present

## 2020-11-14 ENCOUNTER — Ambulatory Visit
Admission: RE | Admit: 2020-11-14 | Discharge: 2020-11-14 | Disposition: A | Discharge status: Home / Self Care | Payer: Medicare Other | Source: Ambulatory Visit | Attending: Family Medicine | Admitting: Family Medicine

## 2020-11-14 ENCOUNTER — Other Ambulatory Visit: Payer: Self-pay

## 2020-11-14 DIAGNOSIS — M545 Low back pain, unspecified: Secondary | ICD-10-CM | POA: Diagnosis not present

## 2020-11-14 DIAGNOSIS — M5416 Radiculopathy, lumbar region: Secondary | ICD-10-CM | POA: Diagnosis not present

## 2020-11-14 IMAGING — MR MR LUMBAR SPINE W/O CM
4 of 5 series · 30 of 48 positions shown · non-contrast
Comparison: Report of lumbar MRI [DATE] (no images available).

CLINICAL DATA: 69-year-old male with 3-4 months of central low back
pain radiating to the hip, buttock and posterior leg right greater
than left.

EXAM:
MRI LUMBAR SPINE WITHOUT CONTRAST
TECHNIQUE: Multiplanar, multisequence MR imaging of the lumbar spine was
performed. No intravenous contrast was administered.

[Series 9: T2 · sagittal · 4.0mm · 0.88mm/px · 6 of 18 slices shown (1 of 2)]
[im 1/18]
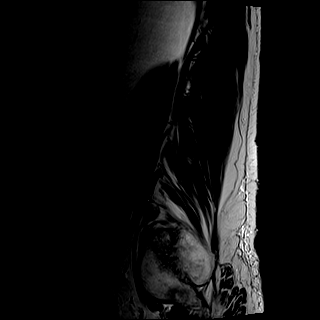
[im 4/18]
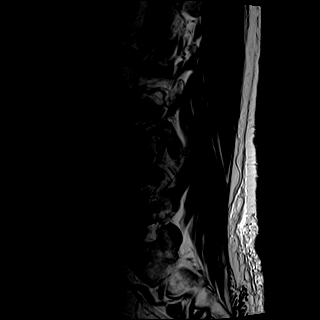
[im 7/18]
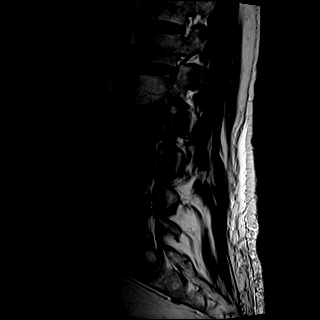
[im 11/18]
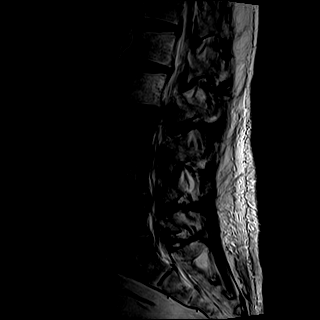
[im 14/18]
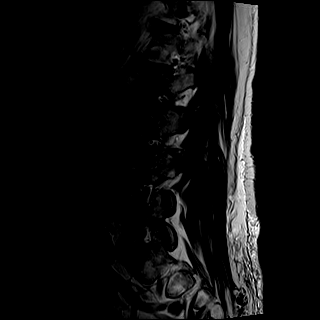
[im 18/18]
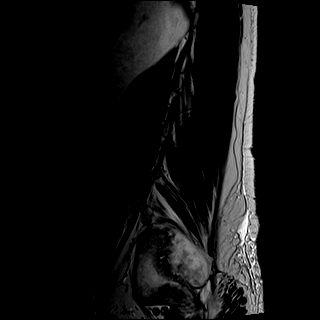

[Series 10: T1 · sagittal · 4.0mm · 0.88mm/px · 6 of 18 slices shown (1 of 2)]
[im 1/18]
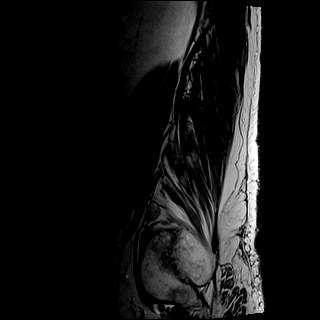
[im 4/18]
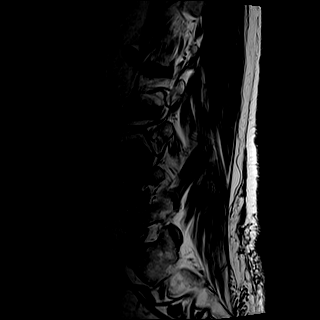
[im 7/18]
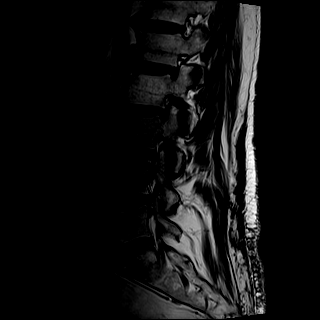
[im 11/18]
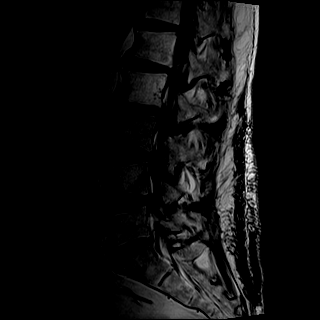
[im 14/18]
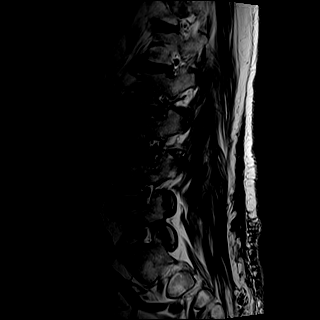
[im 18/18]
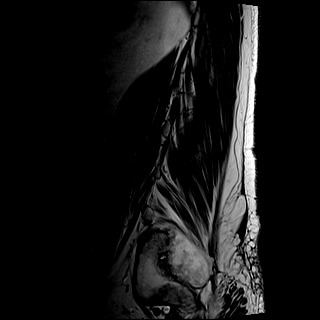

[Series 12: T2 · axial · 4.0mm · 0.78mm/px · z∈[+17,+249]mm · 9 of 42 slices shown (2 of 2)]
[im 1/42]
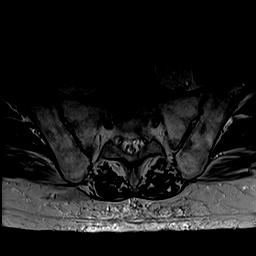
[im 6/42]
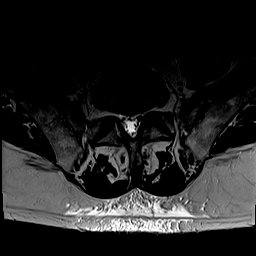
[im 12/42]
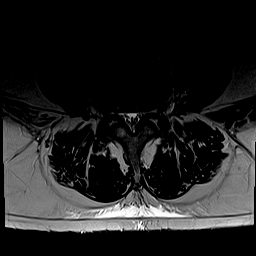
[im 18/42]
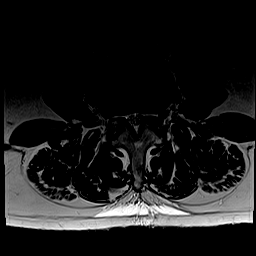
[im 21/42]
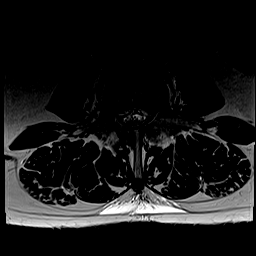
[im 24/42]
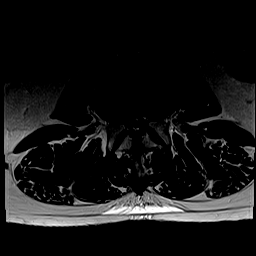
[im 30/42]
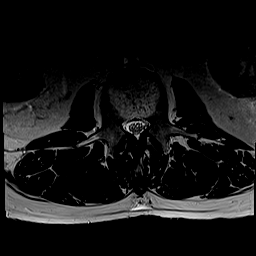
[im 36/42]
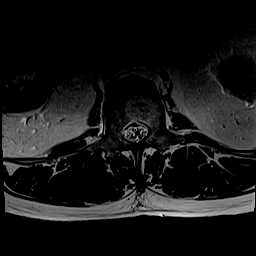
[im 42/42]
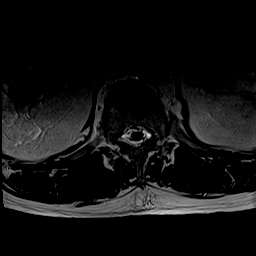

[Series 13: T1 · axial · 4.0mm · 0.39mm/px · z∈[+17,+249]mm · 9 of 42 slices shown (2 of 2)]
[im 1/42]
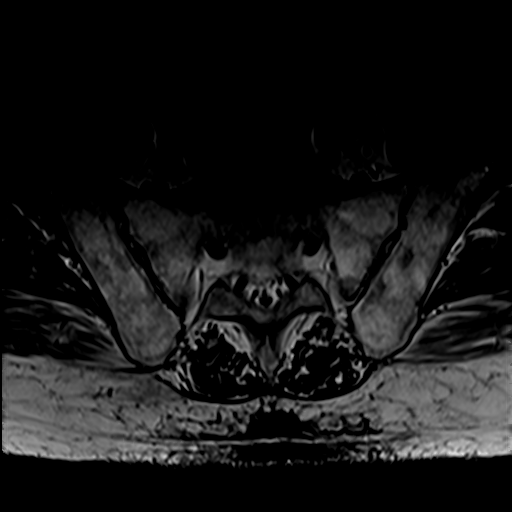
[im 6/42]
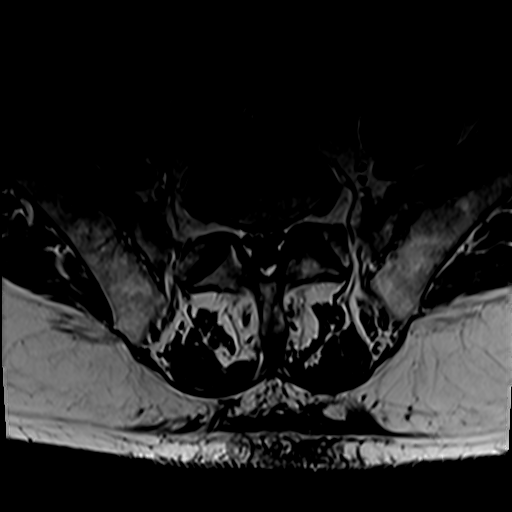
[im 12/42]
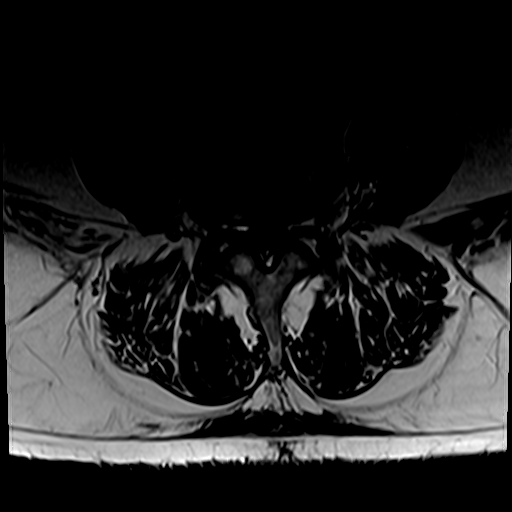
[im 18/42]
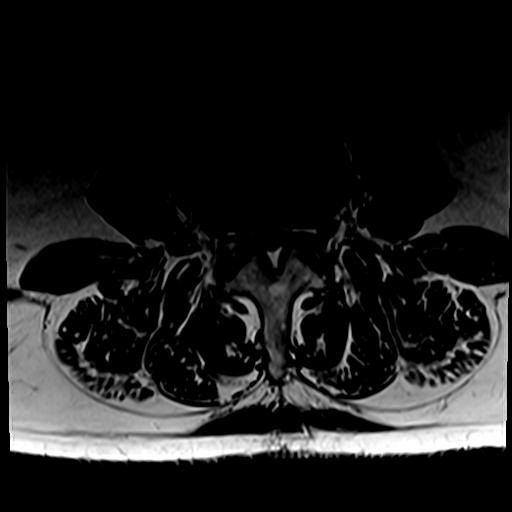
[im 21/42]
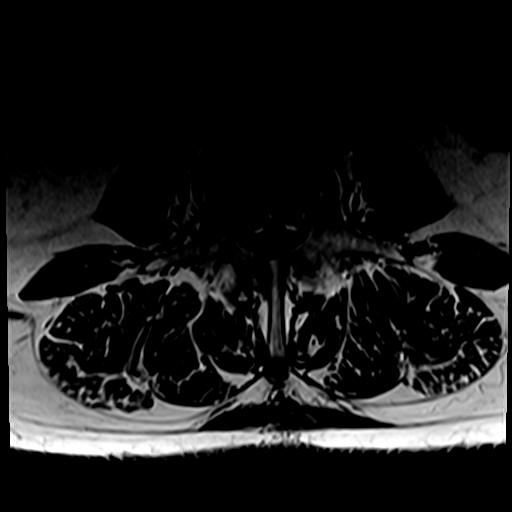
[im 24/42]
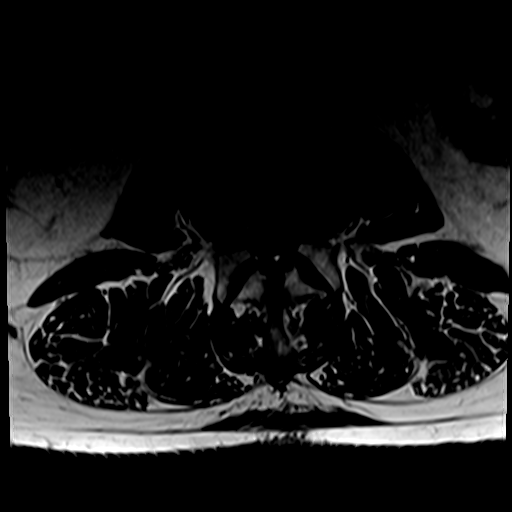
[im 30/42]
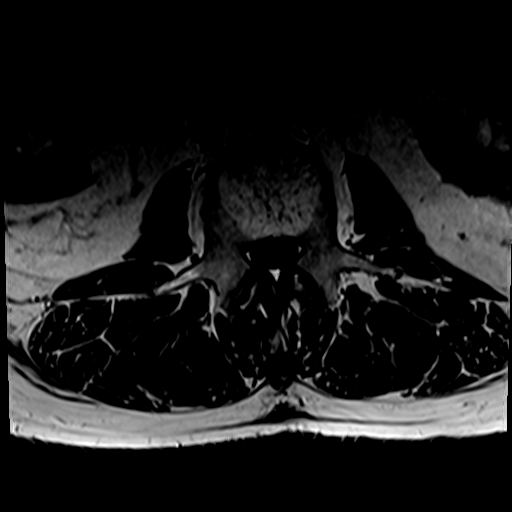
[im 36/42]
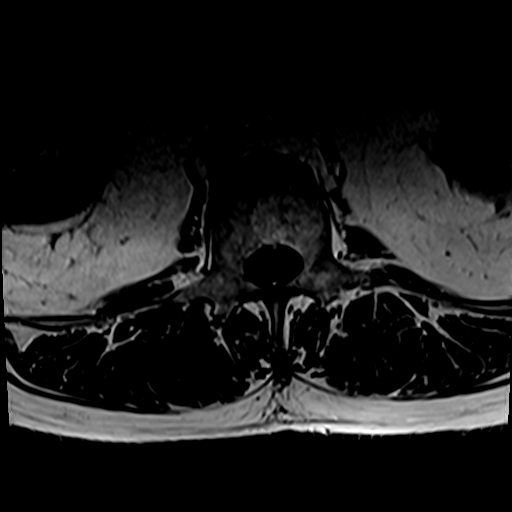
[im 42/42]
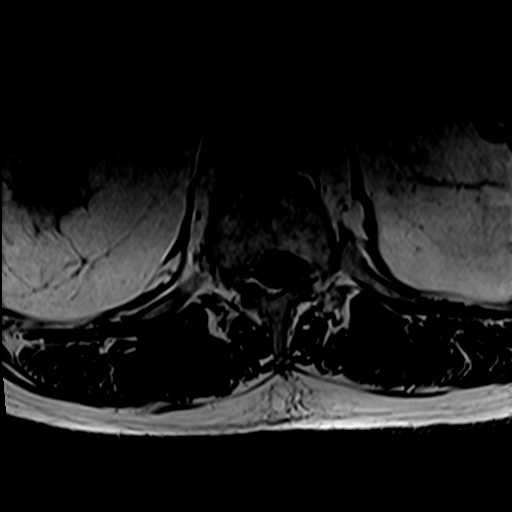

[30 of 48 positions shown; findings below may reference images not displayed]

FINDINGS: Segmentation: Lumbar segmentation appears to be normal and will be
designated as such for this report.

Alignment: Mild straightening of lumbar lordosis. No
spondylolisthesis.

Vertebrae: No marrow edema or evidence of acute osseous abnormality.
Background bone marrow signal is within normal limits. Intact
visible sacrum and SI joints.

Conus medullaris and cauda equina: Conus extends to the T12-L1
level. No lower spinal cord or conus signal abnormality.

Paraspinal and other soft tissues: Visualized abdominal viscera and
paraspinal soft tissues are within normal limits. Diverticulosis of
the large bowel visible in the pelvis.

Disc levels:

Combined congenital and degenerative lower thoracic and lumbar
spinal stenosis, in part due to short pedicle distance.

T12-L1: Small left paracentral disc protrusion (series 12, image 4)
with mild spinal stenosis at the conus. No conus mass effect.

L1-L2: Mild disc bulging and endplate spurring eccentric to the
right. Mild spinal stenosis is mostly congenital and related to
epidural fat. Mild left and moderate right L2 neural foraminal
stenosis is related to disc and endplates.

L2-L3: Bulky circumferential disc osteophyte complex with mild
posterior element hypertrophy and epidural lipomatosis. Superimposed
bulky left far lateral component of disc. Moderate to severe spinal
stenosis (series 9, image 10 and series 12, image 18) with bilateral
lateral recess stenosis. Moderate to severe bilateral L2 foraminal
stenosis.

L3-L4: Circumferential disc osteophyte complex although mostly far
lateral. Mild to moderate posterior element hypertrophy. Less
epidural fat at this level. Mild spinal stenosis. Mild to moderate
bilateral L3 foraminal stenosis.

L4-L5: Mostly far lateral disc bulging and endplate spurring. Mild
to moderate facet and ligament flavum hypertrophy. No epidural
lipomatosis. Mild spinal and bilateral L4 foraminal stenosis.

L5-S1: The congenital canal narrowing abates at this level. Mild
endplate spurring but otherwise negative disc. Mild facet
hypertrophy. No stenosis.
IMPRESSION: Combined congenital (short pedicle distance) and acquired
(degenerative, epidural lipomatosis) lower thoracic and lumbar
spinal stenosis, sparing the L5-S1 level.
The multifactorial spinal and bilateral foraminal stenosis is
moderate to severe at L2-L3, and mild elsewhere.

## 2020-11-15 ENCOUNTER — Other Ambulatory Visit: Payer: Medicare Other

## 2020-11-15 DIAGNOSIS — M5416 Radiculopathy, lumbar region: Secondary | ICD-10-CM | POA: Diagnosis not present

## 2020-11-15 DIAGNOSIS — N1831 Chronic kidney disease, stage 3a: Secondary | ICD-10-CM

## 2020-11-15 DIAGNOSIS — N400 Enlarged prostate without lower urinary tract symptoms: Secondary | ICD-10-CM

## 2020-11-15 DIAGNOSIS — E7800 Pure hypercholesterolemia, unspecified: Secondary | ICD-10-CM

## 2020-11-15 DIAGNOSIS — M5442 Lumbago with sciatica, left side: Secondary | ICD-10-CM | POA: Diagnosis not present

## 2020-11-15 DIAGNOSIS — I10 Essential (primary) hypertension: Secondary | ICD-10-CM

## 2020-11-15 DIAGNOSIS — Z1329 Encounter for screening for other suspected endocrine disorder: Secondary | ICD-10-CM

## 2020-11-15 DIAGNOSIS — M5136 Other intervertebral disc degeneration, lumbar region: Secondary | ICD-10-CM | POA: Diagnosis not present

## 2020-11-15 DIAGNOSIS — G8929 Other chronic pain: Secondary | ICD-10-CM | POA: Diagnosis not present

## 2020-11-15 DIAGNOSIS — M5441 Lumbago with sciatica, right side: Secondary | ICD-10-CM | POA: Diagnosis not present

## 2020-11-17 LAB — CBC WITH DIFFERENTIAL/PLATELET
Basophils Absolute: 0 10*3/uL (ref 0.0–0.2)
Basos: 1 %
EOS (ABSOLUTE): 0.2 10*3/uL (ref 0.0–0.4)
Eos: 3 %
Hematocrit: 41.3 % (ref 37.5–51.0)
Hemoglobin: 14.4 g/dL (ref 13.0–17.7)
Immature Grans (Abs): 0 10*3/uL (ref 0.0–0.1)
Immature Granulocytes: 0 %
Lymphocytes Absolute: 2 10*3/uL (ref 0.7–3.1)
Lymphs: 34 %
MCH: 32.9 pg (ref 26.6–33.0)
MCHC: 34.9 g/dL (ref 31.5–35.7)
MCV: 94 fL (ref 79–97)
Monocytes Absolute: 0.6 10*3/uL (ref 0.1–0.9)
Monocytes: 11 %
Neutrophils Absolute: 3.1 10*3/uL (ref 1.4–7.0)
Neutrophils: 51 %
Platelets: 214 10*3/uL (ref 150–450)
RBC: 4.38 x10E6/uL (ref 4.14–5.80)
RDW: 13.6 % (ref 11.6–15.4)
WBC: 5.9 10*3/uL (ref 3.4–10.8)

## 2020-11-17 LAB — PSA TOTAL+% FREE (SERIAL)
PSA, Free Pct: 22 %
PSA, Free: 0.22 ng/mL
Prostate Specific Ag, Serum: 1 ng/mL (ref 0.0–4.0)

## 2020-11-17 LAB — COMPREHENSIVE METABOLIC PANEL
ALT: 25 IU/L (ref 0–44)
AST: 26 IU/L (ref 0–40)
Albumin/Globulin Ratio: 2 (ref 1.2–2.2)
Albumin: 4.3 g/dL (ref 3.8–4.8)
Alkaline Phosphatase: 75 IU/L (ref 44–121)
BUN/Creatinine Ratio: 14 (ref 10–24)
BUN: 14 mg/dL (ref 8–27)
Bilirubin Total: 0.5 mg/dL (ref 0.0–1.2)
CO2: 20 mmol/L (ref 20–29)
Calcium: 9 mg/dL (ref 8.6–10.2)
Chloride: 103 mmol/L (ref 96–106)
Creatinine, Ser: 1.01 mg/dL (ref 0.76–1.27)
Globulin, Total: 2.1 g/dL (ref 1.5–4.5)
Glucose: 93 mg/dL (ref 65–99)
Potassium: 4.3 mmol/L (ref 3.5–5.2)
Sodium: 141 mmol/L (ref 134–144)
Total Protein: 6.4 g/dL (ref 6.0–8.5)
eGFR: 81 mL/min/{1.73_m2} (ref >59)

## 2020-11-17 LAB — LIPID PANEL
Chol/HDL Ratio: 4.5 ratio (ref 0.0–5.0)
Cholesterol, Total: 184 mg/dL (ref 100–199)
HDL: 41 mg/dL (ref >39)
LDL Chol Calc (NIH): 110 mg/dL — ABNORMAL HIGH (ref 0–99)
Triglycerides: 191 mg/dL — ABNORMAL HIGH (ref 0–149)
VLDL Cholesterol Cal: 33 mg/dL (ref 5–40)

## 2020-11-17 LAB — THYROID PANEL WITH TSH
Free Thyroxine Index: 2 (ref 1.2–4.9)
T3 Uptake Ratio: 29 % (ref 24–39)
T4, Total: 6.8 ug/dL (ref 4.5–12.0)
TSH: 3.3 u[IU]/mL (ref 0.450–4.500)

## 2020-11-22 ENCOUNTER — Telehealth: Payer: Self-pay

## 2020-11-22 ENCOUNTER — Other Ambulatory Visit: Payer: Self-pay

## 2020-11-22 ENCOUNTER — Ambulatory Visit (INDEPENDENT_AMBULATORY_CARE_PROVIDER_SITE_OTHER): Payer: Medicare Other | Admitting: Internal Medicine

## 2020-11-22 ENCOUNTER — Encounter: Payer: Self-pay | Admitting: Internal Medicine

## 2020-11-22 VITALS — BP 146/95 | HR 75 | Temp 97.9°F | Ht 68.9 in | Wt 243.0 lb

## 2020-11-22 DIAGNOSIS — M48061 Spinal stenosis, lumbar region without neurogenic claudication: Secondary | ICD-10-CM | POA: Diagnosis not present

## 2020-11-22 DIAGNOSIS — E7800 Pure hypercholesterolemia, unspecified: Secondary | ICD-10-CM

## 2020-11-22 MED ORDER — LIDOCAINE 5 % EX PTCH: 1.0000 | MEDICATED_PATCH | CUTANEOUS | 2 refills | Status: DC

## 2020-11-22 MED ORDER — FENOFIBRATE 145 MG PO TABS: 145.0000 mg | ORAL_TABLET | Freq: Every day | ORAL | 4 refills | Status: DC

## 2020-11-22 NOTE — Telephone Encounter (Signed)
PA started for Lidocaine 5% patches thorough Cover my meds, awaiting on determination.  PA approved for 10/23/20 through 11/22/21

## 2020-11-22 NOTE — Progress Notes (Signed)
BP (!) 146/95   Pulse 75   Temp 97.9 F (36.6 C) (Oral)   Ht 5' 8.9" (1.75 m)   Wt 243 lb (110.2 kg)   SpO2 99%   BMI 35.99 kg/m    Subjective:    Patient ID: Paul Rodriguez, male    DOB: [birth year], 70 y.o.   MRN: [id number]  Chief Complaint  Patient presents with  . Chronic Kidney Disease  . Hypertension  . Hyperlipidemia  . Erectile Dysfunction    HPI: Paul Rodriguez is a 70 y.o. male  Hypertension This is a chronic problem.  Hyperlipidemia This is a chronic (TG very high still at 191 not on meds.) problem.  Back Pain This is a chronic (no bowel or bladder incontinecne.) problem. The pain is present in the lumbar spine and thoracic spine. The pain is at a severity of 3/10 (right now sitting helps and laying down no pain , but movt /. standign stil lworsens pain and causes - better on leaning forwards has sciatica as well). Pertinent negatives include no abdominal pain or numbness.   Chief Complaint  Patient presents with  . Chronic Kidney Disease  . Hypertension  . Hyperlipidemia  . Erectile Dysfunction    Relevant past medical, surgical, family and social history reviewed and updated as indicated. Interim medical history since our last visit reviewed. Allergies and medications reviewed and updated.  Review of Systems  Gastrointestinal:  Negative for abdominal pain.  Musculoskeletal:  Positive for back pain.  Neurological:  Negative for numbness.   Per HPI unless specifically indicated above     Objective:    BP (!) 146/95   Pulse 75   Temp 97.9 F (36.6 C) (Oral)   Ht 5' 8.9" (1.75 m)   Wt 243 lb (110.2 kg)   SpO2 99%   BMI 35.99 kg/m   Wt Readings from Last 3 Encounters:  11/22/20 243 lb (110.2 kg)  10/11/20 240 lb (108.9 kg)  03/04/20 237 lb (107.5 kg)    Physical Exam Vitals and nursing note reviewed.  Constitutional:      General: He is not in acute distress.    Appearance: Normal appearance. He is not ill-appearing or diaphoretic.   HENT:     Head: Normocephalic and atraumatic.     Right Ear: Tympanic membrane and external ear normal. There is no impacted cerumen.     Left Ear: External ear normal.     Nose: No congestion or rhinorrhea.     Mouth/Throat:     Pharynx: No oropharyngeal exudate or posterior oropharyngeal erythema.  Eyes:     Conjunctiva/sclera: Conjunctivae normal.     Pupils: Pupils are equal, round, and reactive to light.  Cardiovascular:     Rate and Rhythm: Normal rate and regular rhythm.     Heart sounds: No murmur heard.   No friction rub. No gallop.  Pulmonary:     Effort: No respiratory distress.     Breath sounds: No stridor. No wheezing or rhonchi.  Chest:     Chest wall: No tenderness.  Abdominal:     General: Abdomen is flat. Bowel sounds are normal.     Palpations: Abdomen is soft. There is no mass.     Tenderness: There is no abdominal tenderness.  Musculoskeletal:     Cervical back: Normal range of motion and neck supple. No rigidity or tenderness.     Left lower leg: No edema.  Skin:    General: Skin is  warm and dry.  Neurological:     Mental Status: He is alert.    Results for orders placed or performed in visit on 11/15/20  Lipid panel  Result Value Ref Range   Cholesterol, Total 184 100 - 199 mg/dL   Triglycerides 191 (H) 0 - 149 mg/dL   HDL 41 >39 mg/dL   VLDL Cholesterol Cal 33 5 - 40 mg/dL   LDL Chol Calc (NIH) 110 (H) 0 - 99 mg/dL   Chol/HDL Ratio 4.5 0.0 - 5.0 ratio  Thyroid Panel With TSH  Result Value Ref Range   TSH 3.300 0.450 - 4.500 uIU/mL   T4, Total 6.8 4.5 - 12.0 ug/dL   T3 Uptake Ratio 29 24 - 39 %   Free Thyroxine Index 2.0 1.2 - 4.9  CBC with Differential/Platelet  Result Value Ref Range   WBC 5.9 3.4 - 10.8 x10E3/uL   RBC 4.38 4.14 - 5.80 x10E6/uL   Hemoglobin 14.4 13.0 - 17.7 g/dL   Hematocrit 41.3 37.5 - 51.0 %   MCV 94 79 - 97 fL   MCH 32.9 26.6 - 33.0 pg   MCHC 34.9 31.5 - 35.7 g/dL   RDW 13.6 11.6 - 15.4 %   Platelets 214 150 - 450  x10E3/uL   Neutrophils 51 Not Estab. %   Lymphs 34 Not Estab. %   Monocytes 11 Not Estab. %   Eos 3 Not Estab. %   Basos 1 Not Estab. %   Neutrophils Absolute 3.1 1.4 - 7.0 x10E3/uL   Lymphocytes Absolute 2.0 0.7 - 3.1 x10E3/uL   Monocytes Absolute 0.6 0.1 - 0.9 x10E3/uL   EOS (ABSOLUTE) 0.2 0.0 - 0.4 x10E3/uL   Basophils Absolute 0.0 0.0 - 0.2 x10E3/uL   Immature Granulocytes 0 Not Estab. %   Immature Grans (Abs) 0.0 0.0 - 0.1 x10E3/uL  Comprehensive metabolic panel  Result Value Ref Range   Glucose 93 65 - 99 mg/dL   BUN 14 8 - 27 mg/dL   Creatinine, Ser 1.01 0.76 - 1.27 mg/dL   eGFR 81 >59 mL/min/1.73   BUN/Creatinine Ratio 14 10 - 24   Sodium 141 134 - 144 mmol/L   Potassium 4.3 3.5 - 5.2 mmol/L   Chloride 103 96 - 106 mmol/L   CO2 20 20 - 29 mmol/L   Calcium 9.0 8.6 - 10.2 mg/dL   Total Protein 6.4 6.0 - 8.5 g/dL   Albumin 4.3 3.8 - 4.8 g/dL   Globulin, Total 2.1 1.5 - 4.5 g/dL   Albumin/Globulin Ratio 2.0 1.2 - 2.2   Bilirubin Total 0.5 0.0 - 1.2 mg/dL   Alkaline Phosphatase 75 44 - 121 IU/L   AST 26 0 - 40 IU/L   ALT 25 0 - 44 IU/L  PSA Total+%Free (Serial)  Result Value Ref Range   Prostate Specific Ag, Serum 1.0 0.0 - 4.0 ng/mL   PSA, Free 0.22 N/A ng/mL   PSA, Free Pct 22.0 %        Current Outpatient Medications:  .  aspirin 81 MG tablet, Take 81 mg by mouth daily., Disp: , Rfl:  .  Cholecalciferol (VITAMIN D3) 25 MCG (1000 UT) CAPS, Take 3,000 Units by mouth daily., Disp: , Rfl:  .  EPINEPHrine 0.3 mg/0.3 mL IJ SOAJ injection, , Disp: , Rfl:  .  fluticasone (FLONASE) 50 MCG/ACT nasal spray, Place 2 sprays into both nostrils daily., Disp: 16 g, Rfl: 6 .  FOLIC ACID PO, Take by mouth daily., Disp: , Rfl:  .  ibuprofen (ADVIL,MOTRIN) 200 MG tablet, Take 400 mg by mouth daily., Disp: , Rfl:  .  loratadine-pseudoephedrine (CLARITIN-D 12-HOUR) 5-120 MG tablet, Take by mouth every 12 (twelve) hours as needed., Disp: , Rfl:  .  losartan (COZAAR) 100 MG tablet,  TAKE 1 TABLET BY MOUTH EVERY DAY, Disp: 90 tablet, Rfl: 1 .  methotrexate (RHEUMATREX) 2.5 MG tablet, Take 15 mg by mouth once a week. , Disp: , Rfl:  .  Multiple Vitamin (MULTIVITAMIN) tablet, Take 1 tablet by mouth daily., Disp: , Rfl:  .  Omega-3 Fatty Acids (FISH OIL) 1000 MG CAPS, Take 1,000 mg by mouth daily., Disp: , Rfl:  .  omeprazole (PRILOSEC) 20 MG capsule, Take 20 mg by mouth daily., Disp: , Rfl:  .  tadalafil (CIALIS) 5 MG tablet, Take 1 tablet (5 mg total) by mouth daily as needed for erectile dysfunction., Disp: 90 tablet, Rfl: 0 .  vitamin B-12 (CYANOCOBALAMIN) 1000 MCG tablet, Take 1,000 mcg by mouth daily., Disp: , Rfl:   Assessment & Plan:   1. Htn : [place], does have back pain  Is on cozaar for such Continue current meds.  Medication compliance emphasised. pt advised to keep Bp logs. Pt verbalised understanding of the same. Pt to have a low salt diet . Exercise to reach a goal of at least 150 mins a week.  lifestyle modifications explained and pt understands importance of the above.     2. HLD :TG high at 182  doenst take any meds now. Failed statins - recheck before next visit.   Ref. Range 03/04/2020 14:46 09/29/2020 16:41 11/14/2020 19:59 11/15/2020 09:31  Total CHOL/HDL Ratio Latest Ref Range: 0.0 - 5.0 ratio    4.5  Cholesterol, Total Latest Ref Range: 100 - 199 mg/dL 151   184  HDL Cholesterol Latest Ref Range: >39 mg/dL 38 (L)   41  LDL/HDL Ratio Latest Ref Range: 0.0 - 3.6 ratio 2.1     Triglycerides Latest Ref Range: 0 - 149 mg/dL 185 (H)   191 (H)  VLDL Cholesterol Cal Latest Ref Range: 5 - 40 mg/dL 32   33  LDL Chol Calc (NIH) Latest Ref Range: 0 - 99 mg/dL 81   110 (H)   3, Sees cardiologist mom and dad had CAD when they were older. Sees Dr. Nehemiah Massed, his HDL was always low TG   4. RA chroinc, stable, asymptomatic from such currently. is on MTX q weekly sees rheumatology for such   5. Back pain worsening : pt to call if his pain worsens, will start lidoderm  patches for pain to fu with ortho. To have injections in the lower back, has been seeing ortho for such has been on gabapentin for such, feels he gets a headache, dizziness and cannot take such.   Combined congenital (short pedicle distance) and acquired (degenerative, epidural lipomatosis) lower thoracic and lumbar spinal stenosis, sparing the L5-S1 level. The multifactorial spinal and bilateral foraminal stenosis is moderate to severe at L2-L3, and mild elsewhere.  Problem List Items Addressed This Visit   None   No orders of the defined types were placed in this encounter.    No orders of the defined types were placed in this encounter.    Follow up plan: No follow-ups on file.

## 2020-11-24 ENCOUNTER — Other Ambulatory Visit: Payer: Self-pay

## 2020-11-24 ENCOUNTER — Telehealth: Payer: Self-pay

## 2020-11-24 MED ORDER — FENOFIBRATE 145 MG PO TABS: 145.0000 mg | ORAL_TABLET | Freq: Every day | ORAL | 4 refills | Status: DC

## 2020-11-24 NOTE — Telephone Encounter (Signed)
Prescription has been resent to [facility]

## 2020-11-24 NOTE — Telephone Encounter (Signed)
Copied from Paul Rodriguez [id number]. Topic: Quick Communication - Rx Refill/Question >> Nov 24, 2020 11:01 AM Loma Boston wrote: Medication: fenofibrate (TRICOR) 145 MG tablet 30 tablet 4 11/22/2020   Sig - Route: Take 1 tablet (145 mg total) by mouth daily. - Oral  Sent to pharmacy as: fenofibrate (TRICOR) 145 MG tablet  E-Prescribing Status: Receipt confirmed by pharmacy (11/22/2020 9:57 AM EDT)   Pt has found this script that went to [facility] $100.00 cheaper at [facility] pls resend.   Has the patient contacted their pharmacy? Yes.   (Agent: If no, request that the patient contact the pharmacy for the refill.) (Agent: If yes, when and what did the pharmacy advise?)call dr  Preferred Pharmacy (with phone number or street name): [place], [place] - [address] [place] [address] Phone: [phone] Fax: [phone] Hours: Not open 24 hours    Agent: Please be advised that RX refills may take up to 3 business days. We ask that you follow-up with your pharmacy.

## 2020-11-24 NOTE — Telephone Encounter (Signed)
An you send this to the cheaper pharmacy

## 2020-11-24 NOTE — Telephone Encounter (Signed)
Patient wanted the prescription resent to [facility] instead of [facility]

## 2020-12-06 DIAGNOSIS — M48062 Spinal stenosis, lumbar region with neurogenic claudication: Secondary | ICD-10-CM | POA: Diagnosis not present

## 2020-12-06 DIAGNOSIS — M5416 Radiculopathy, lumbar region: Secondary | ICD-10-CM | POA: Diagnosis not present

## 2020-12-22 DIAGNOSIS — M546 Pain in thoracic spine: Secondary | ICD-10-CM | POA: Diagnosis not present

## 2020-12-22 DIAGNOSIS — M13852 Other specified arthritis, left hip: Secondary | ICD-10-CM | POA: Diagnosis not present

## 2020-12-22 DIAGNOSIS — M13851 Other specified arthritis, right hip: Secondary | ICD-10-CM | POA: Diagnosis not present

## 2020-12-26 ENCOUNTER — Other Ambulatory Visit: Payer: Self-pay | Admitting: Physician Assistant

## 2020-12-26 DIAGNOSIS — M546 Pain in thoracic spine: Secondary | ICD-10-CM

## 2020-12-27 DIAGNOSIS — M5136 Other intervertebral disc degeneration, lumbar region: Secondary | ICD-10-CM | POA: Diagnosis not present

## 2020-12-27 DIAGNOSIS — M48062 Spinal stenosis, lumbar region with neurogenic claudication: Secondary | ICD-10-CM | POA: Diagnosis not present

## 2020-12-27 DIAGNOSIS — M5416 Radiculopathy, lumbar region: Secondary | ICD-10-CM | POA: Diagnosis not present

## 2021-01-09 DIAGNOSIS — I10 Essential (primary) hypertension: Secondary | ICD-10-CM | POA: Diagnosis not present

## 2021-01-09 DIAGNOSIS — R0602 Shortness of breath: Secondary | ICD-10-CM | POA: Diagnosis not present

## 2021-01-09 DIAGNOSIS — E782 Mixed hyperlipidemia: Secondary | ICD-10-CM | POA: Diagnosis not present

## 2021-01-10 ENCOUNTER — Ambulatory Visit
Admission: RE | Admit: 2021-01-10 | Discharge: 2021-01-10 | Disposition: A | Discharge status: Home / Self Care | Payer: Medicare Other | Source: Ambulatory Visit | Attending: Physician Assistant | Admitting: Physician Assistant

## 2021-01-10 ENCOUNTER — Telehealth: Payer: Self-pay | Admitting: Internal Medicine

## 2021-01-10 ENCOUNTER — Other Ambulatory Visit: Payer: Self-pay

## 2021-01-10 DIAGNOSIS — M546 Pain in thoracic spine: Secondary | ICD-10-CM

## 2021-01-10 DIAGNOSIS — M47814 Spondylosis without myelopathy or radiculopathy, thoracic region: Secondary | ICD-10-CM | POA: Diagnosis not present

## 2021-01-10 DIAGNOSIS — M2578 Osteophyte, vertebrae: Secondary | ICD-10-CM | POA: Diagnosis not present

## 2021-01-10 DIAGNOSIS — J984 Other disorders of lung: Secondary | ICD-10-CM | POA: Diagnosis not present

## 2021-01-10 IMAGING — CT CT T SPINE W/O CM
3 of 5 series · 9 of 33 positions shown, 11 images · non-contrast
Comparison: None.

CLINICAL DATA: Fell on [DATE] landing on the right side of the
chest. Upper back pain and chest pain.

EXAM:
CT THORACIC SPINE WITHOUT CONTRAST
TECHNIQUE: Multidetector CT images of the thoracic were obtained using the
standard protocol without intravenous contrast.

[Series 4: t-spine st t-spine 2.00 · axial · 0.35mm/px · z∈[-989,-989]mm · 1 of 179 slices shown, 2 images]
[im 90/179  soft-tissue]
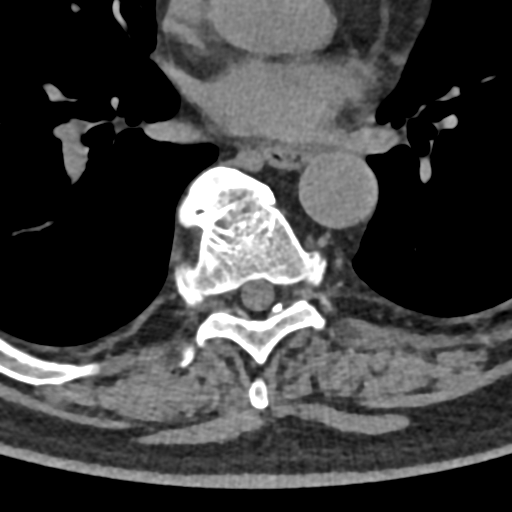
[im 90/179  bone]
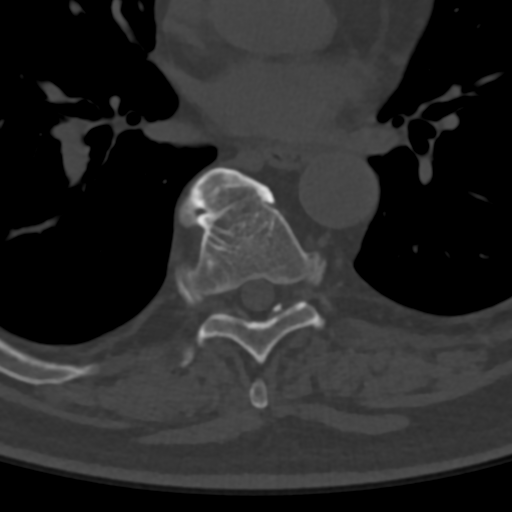

[Series 6: sag bone t-spine 2.00 sag · sagittal · 0.35mm/px · 5 of 90 slices shown, 6 images]
[im 30/90  bone]
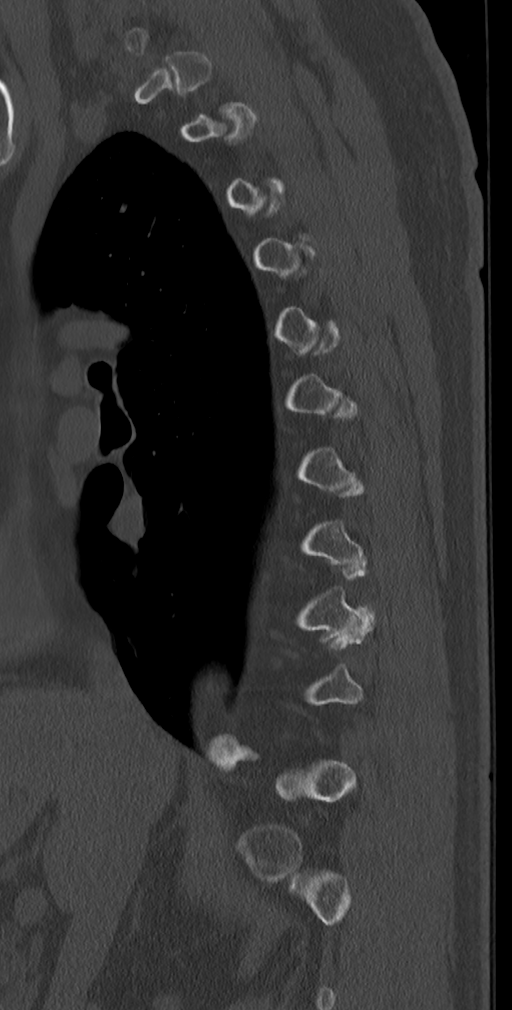
[im 38/90  bone]
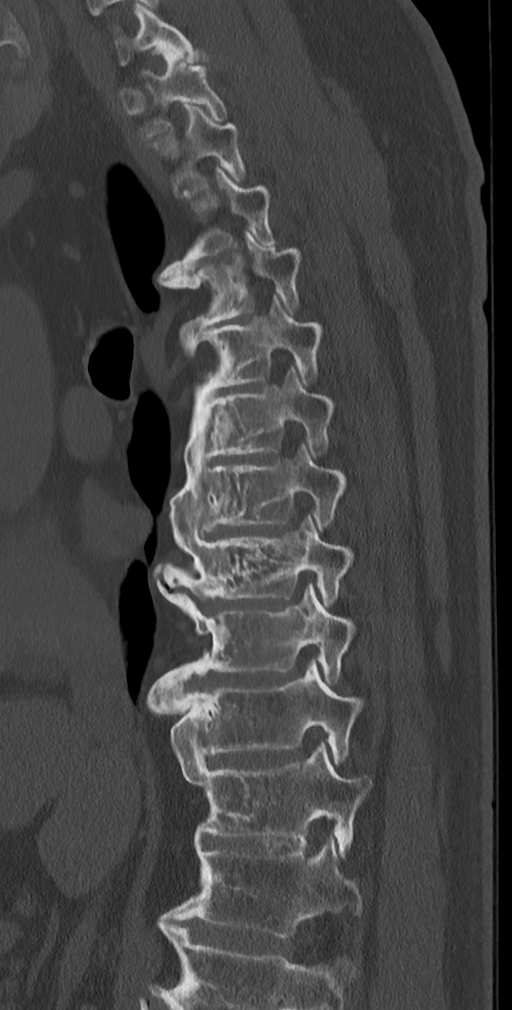
[im 45/90  soft-tissue]
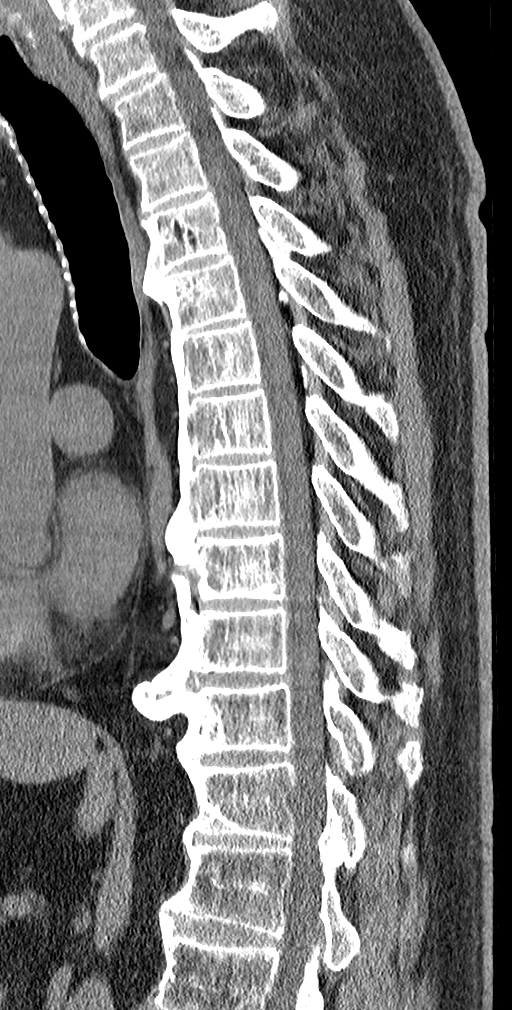
[im 45/90  bone]
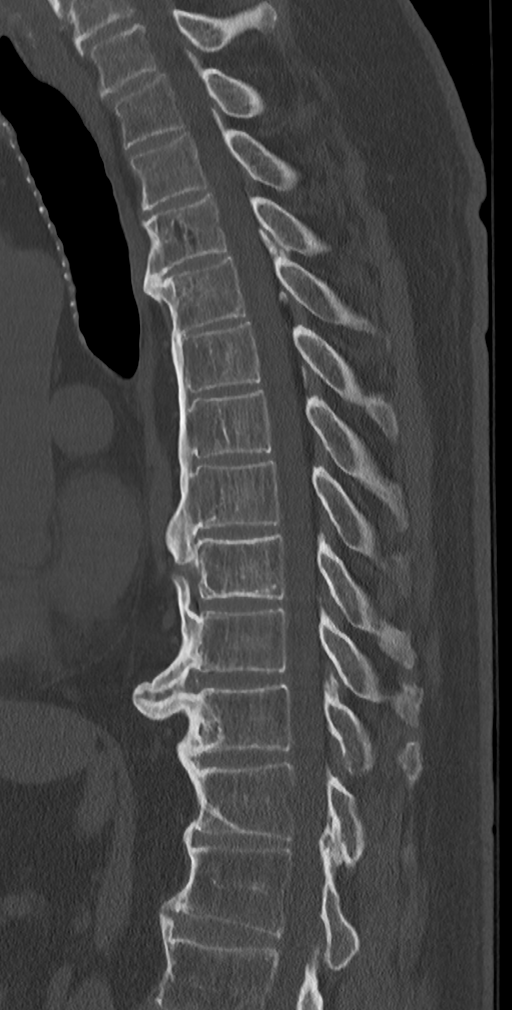
[im 52/90  bone]
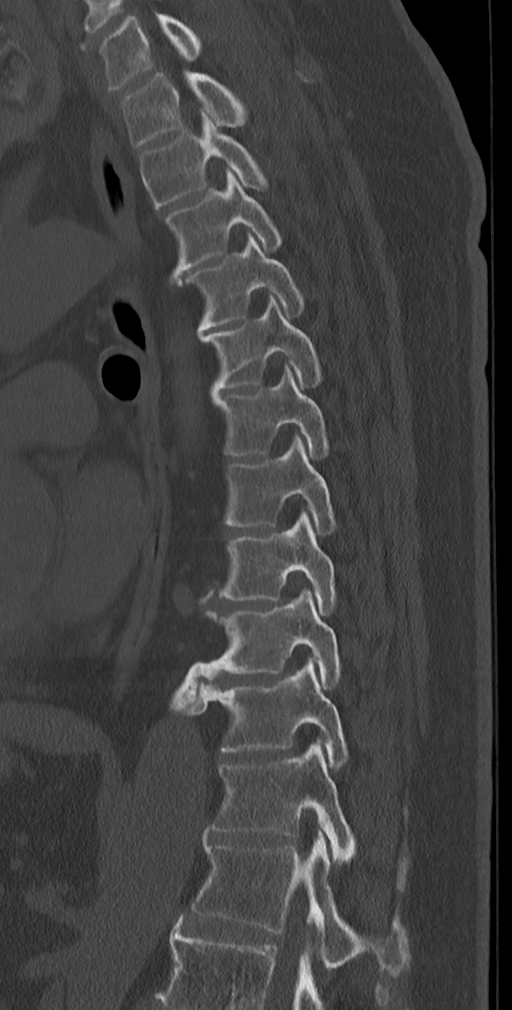
[im 60/90  bone]
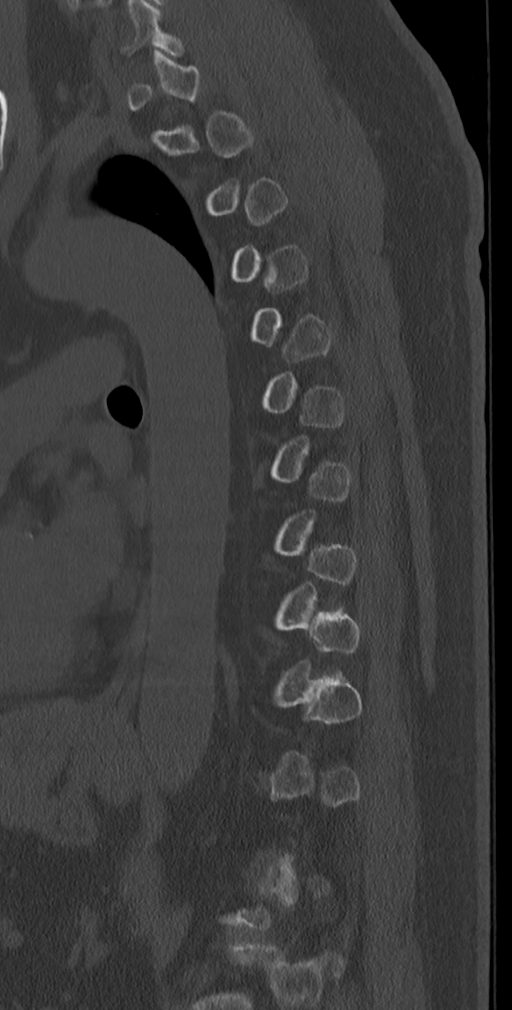

[Series 8: cor bone t-spine 2.00 cor · coronal · 0.35mm/px · 3 of 90 slices shown]
[im 18/90  bone]
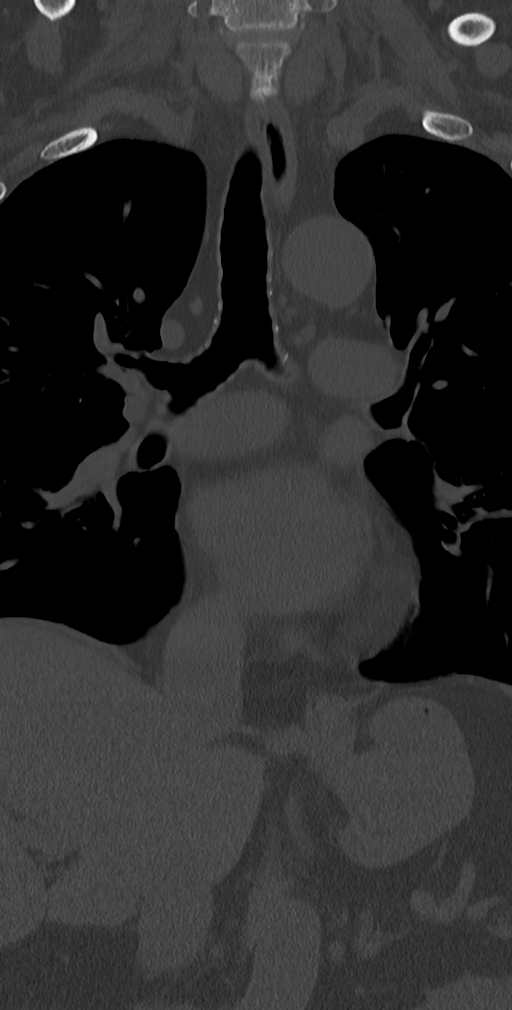
[im 36/90  bone]
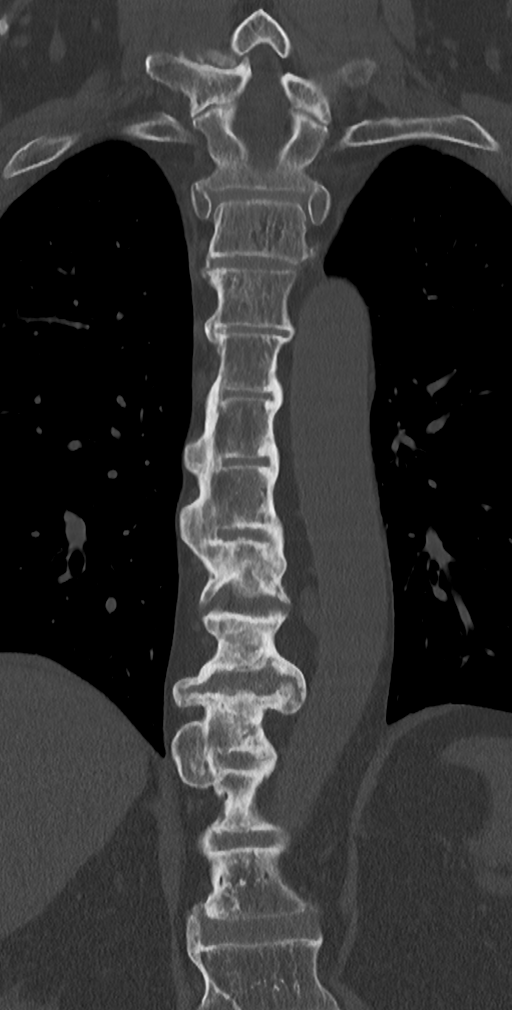
[im 54/90  bone]
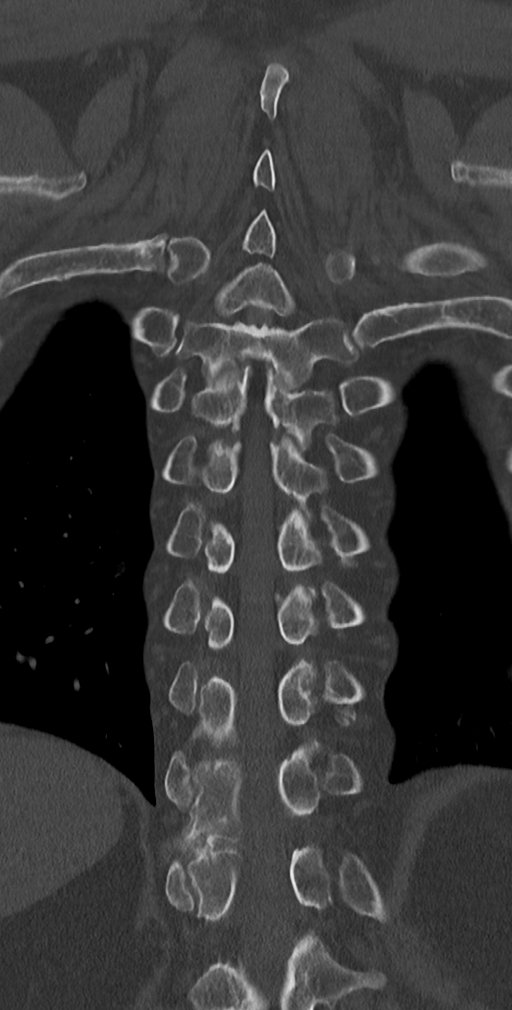

[9 of 33 positions shown; findings below may reference images not displayed]

FINDINGS: Alignment: No malalignment.

Vertebrae: No regional fracture. Probable benign hemangioma within
the T3 vertebral body. Solid bridging osteophytes from T3 through T8
and from T9-T12.

Paraspinal and other soft tissues: No evidence of proximal rib
fracture on either side. Small focus of scarring or atelectasis in
the right posteromedial lower lobe. There does not appear to be an
underlying chest wall hematoma rib fracture.

Disc levels: No compressive disc space pathology identified. No
evidence of posterior osteophytes or soft disc herniation. Minimal
facet osteoarthritis in the lower thoracic region without stenosis.
IMPRESSION: No traumatic thoracic finding. Bridging osteophytes throughout the
thoracic region as outlined above. No evidence of spine fracture or
proximal rib fracture.

Small region of peripheral pulmonary density in the right lower lobe
adjacent to the right eighth rib, but no evidence of rib fracture or
chest wall hematoma. This could be atelectasis or scarring.

## 2021-01-10 NOTE — Telephone Encounter (Signed)
Copied from [place] [phone]. Topic: Medicare AWV >> Jan 10, 2021  4:14 PM Lavonia Drafts wrote: Reason for CRM:  LM for patient to call the office back to reschedule his AWVs appointment with the [facility] on 02/01/21, due to  [facility] schedule change. This will be by phone not in the office. If patient calls back please schedule him on a Monday or Friday with [facility].

## 2021-01-10 NOTE — Telephone Encounter (Signed)
Copied from [place] [id number]. Topic: Medicare AWV >> Jan 10, 2021  4:14 PM Lavonia Drafts wrote: Reason for CRM:  LM for patient to call the office back to reschedule his AWVs appointment with the [facility] on 02/01/21, due to  [facility] schedule change. This will be by phone not in the office. If patient calls back please schedule him on a Monday or Friday with [facility].

## 2021-01-13 DIAGNOSIS — M48062 Spinal stenosis, lumbar region with neurogenic claudication: Secondary | ICD-10-CM | POA: Diagnosis not present

## 2021-01-13 DIAGNOSIS — M5416 Radiculopathy, lumbar region: Secondary | ICD-10-CM | POA: Diagnosis not present

## 2021-01-18 NOTE — Telephone Encounter (Signed)
Patient called in to speak to Paul Rodriguez about rescheduling his appointment as requested on 01/10/21. Can be reached at Ph#  [phone]

## 2021-01-20 DIAGNOSIS — H2513 Age-related nuclear cataract, bilateral: Secondary | ICD-10-CM | POA: Diagnosis not present

## 2021-01-31 DIAGNOSIS — M5414 Radiculopathy, thoracic region: Secondary | ICD-10-CM | POA: Diagnosis not present

## 2021-02-01 ENCOUNTER — Ambulatory Visit: Payer: Medicare Other

## 2021-02-01 DIAGNOSIS — Z23 Encounter for immunization: Secondary | ICD-10-CM | POA: Diagnosis not present

## 2021-02-02 ENCOUNTER — Other Ambulatory Visit (HOSPITAL_COMMUNITY): Payer: Self-pay | Admitting: Neurosurgery

## 2021-02-02 ENCOUNTER — Other Ambulatory Visit: Payer: Self-pay | Admitting: Neurosurgery

## 2021-02-02 DIAGNOSIS — M5414 Radiculopathy, thoracic region: Secondary | ICD-10-CM

## 2021-02-03 DIAGNOSIS — M5416 Radiculopathy, lumbar region: Secondary | ICD-10-CM | POA: Diagnosis not present

## 2021-02-03 DIAGNOSIS — Z23 Encounter for immunization: Secondary | ICD-10-CM | POA: Diagnosis not present

## 2021-02-03 DIAGNOSIS — M48062 Spinal stenosis, lumbar region with neurogenic claudication: Secondary | ICD-10-CM | POA: Diagnosis not present

## 2021-02-03 DIAGNOSIS — M5136 Other intervertebral disc degeneration, lumbar region: Secondary | ICD-10-CM | POA: Diagnosis not present

## 2021-02-09 DIAGNOSIS — R0602 Shortness of breath: Secondary | ICD-10-CM | POA: Diagnosis not present

## 2021-02-10 ENCOUNTER — Ambulatory Visit (INDEPENDENT_AMBULATORY_CARE_PROVIDER_SITE_OTHER): Payer: Medicare Other

## 2021-02-10 VITALS — Ht 69.0 in | Wt 230.0 lb

## 2021-02-10 DIAGNOSIS — Z0000 Encounter for general adult medical examination without abnormal findings: Secondary | ICD-10-CM

## 2021-02-10 NOTE — Progress Notes (Signed)
I connected with Paul Rodriguez today by telephone and verified that I am speaking with the correct person using two identifiers. Location patient: home Location provider: work Persons participating in the virtual visit: Paul Rodriguez, Paul Durand LPN.   I discussed the limitations, risks, security and privacy concerns of performing an evaluation and management service by telephone and the availability of in person appointments. I also discussed with the patient that there may be a patient responsible charge related to this service. The patient expressed understanding and verbally consented to this telephonic visit.    Interactive audio and video telecommunications were attempted between this provider and patient, however failed, due to patient having technical difficulties OR patient did not have access to video capability.  We continued and completed visit with audio only.     Vital signs may be patient reported or missing.  Subjective:   Paul Rodriguez is a 70 y.o. male who presents for Medicare Annual/Subsequent preventive examination.  Review of Systems     Cardiac Risk Factors include: advanced age (>66men, >70 women);dyslipidemia;hypertension;male gender;obesity (BMI >30kg/m2);sedentary lifestyle     Objective:    Today's Vitals   02/10/21 0856 02/10/21 0857  Weight: 230 lb (104.3 kg)   Height: 5\' 9"  (1.753 m)   PainSc:  3    Body mass index is 33.97 kg/m.  Advanced Directives 02/10/2021 02/01/2020 01/05/2019 07/23/2018 05/02/2017  Does Patient Have a Medical Advance Directive? Yes Yes Yes Yes Yes  Type of Paramedic of [place];Living will [place];Living will [place];Living will [place];Living will [place];Living will  Does patient want to make changes to medical advance directive? - - - No - Patient declined -  Copy of [place] in Chart? No -  copy requested No - copy requested No - copy requested No - copy requested -    Current Medications (verified) Outpatient Encounter Medications as of 02/10/2021  Medication Sig   Cholecalciferol (VITAMIN D3) 25 MCG (1000 UT) CAPS Take 3,000 Units by mouth daily.   EPINEPHrine 0.3 mg/0.3 mL IJ SOAJ injection    fenofibrate (TRICOR) 145 MG tablet Take 1 tablet (145 mg total) by mouth daily.   FOLIC ACID PO Take by mouth daily.   ibuprofen (ADVIL,MOTRIN) 200 MG tablet Take 400 mg by mouth daily.   loratadine-pseudoephedrine (CLARITIN-D 12-HOUR) 5-120 MG tablet Take by mouth every 12 (twelve) hours as needed.   methotrexate (RHEUMATREX) 2.5 MG tablet Take 15 mg by mouth once a week.    Multiple Vitamin (MULTIVITAMIN) tablet Take 1 tablet by mouth daily.   Omega-3 Fatty Acids (FISH OIL) 1000 MG CAPS Take 1,000 mg by mouth daily.   omeprazole (PRILOSEC) 20 MG capsule Take 20 mg by mouth daily.   valsartan (DIOVAN) 160 MG tablet Take by mouth.   vitamin B-12 (CYANOCOBALAMIN) 1000 MCG tablet Take 1,000 mcg by mouth daily.   aspirin 81 MG tablet Take 81 mg by mouth daily.   fluticasone (FLONASE) 50 MCG/ACT nasal spray Place 2 sprays into both nostrils daily.   lidocaine (LIDODERM) 5 % Place 1 patch onto the skin daily. Remove & Discard patch within 12 hours or as directed by MD   losartan (COZAAR) 100 MG tablet TAKE 1 TABLET BY MOUTH EVERY DAY   tadalafil (CIALIS) 5 MG tablet Take 1 tablet (5 mg total) by mouth daily as needed for erectile dysfunction.   No facility-administered encounter medications on file as  of 02/10/2021.    Allergies (verified) Ultram [tramadol]   History: Past Medical History:  Diagnosis Date   Allergy    Arthritis    Rheumatoid arthritis   Depression    GERD (gastroesophageal reflux disease)    History of kidney stones    Hyperlipidemia    Hypertension    Past Surgical History:  Procedure Laterality Date   COLONOSCOPY WITH PROPOFOL N/A 07/23/2018   Procedure:  COLONOSCOPY WITH PROPOFOL;  Surgeon: Lin Landsman, MD;  Location: [facility];  Service: Endoscopy;  Laterality: N/A;  Requests early   EYE SURGERY  Lasik   eyelid surgery Bilateral 06/2020   HIP SURGERY Right 2014   arthroscopic   HIP SURGERY Left 03/2013   arthroscopic   KNEE SURGERY Left 03/2013   NOSE SURGERY     POLYPECTOMY  07/23/2018   Procedure: POLYPECTOMY;  Surgeon: Lin Landsman, MD;  Location: [facility];  Service: Endoscopy;;   Family History  Problem Relation Age of Onset   Hypertension Mother    Hypertension Father    Heart disease Father    Stroke Father    Diabetes Maternal Grandfather    Heart disease Maternal Grandmother    Cancer Paternal Grandmother    Cancer Paternal Grandfather    Social History   Socioeconomic History   Marital status: Married    Spouse name: Not on file   Number of children: Not on file   Years of education: Not on file   Highest education level: Associate degree: academic program  Occupational History   Occupation: retired   Tobacco Use   Smoking status: Former    Packs/day: 1.00    Years: 10.00    Pack years: 10.00    Types: Cigarettes    Quit date: 05/22/1991    Years since quitting: 29.7   Smokeless tobacco: Never  Vaping Use   Vaping Use: Never used  Substance and Sexual Activity   Alcohol use: Yes    Alcohol/week: 5.0 standard drinks    Types: 5 Standard drinks or equivalent per week    Comment: on occasion/socially   Drug use: No   Sexual activity: Yes    Birth control/protection: None  Other Topics Concern   Not on file  Social History Narrative   Stays active around the house   Social Determinants of Health   Financial Resource Strain: Low Risk    Difficulty of Paying Living Expenses: Not hard at all  Food Insecurity: No Food Insecurity   Worried About Charity fundraiser in the Last Year: Never true   [place] in the Last Year: Never true  Transportation Needs: No  Transportation Needs   Lack of Transportation (Medical): No   Lack of Transportation (Non-Medical): No  Physical Activity: Inactive   Days of Exercise per Week: 0 days   Minutes of Exercise per Session: 0 min  Stress: No Stress Concern Present   Feeling of Stress : Not at all  Social Connections: Not on file    Tobacco Counseling Counseling given: Not Answered   Clinical Intake:  Pre-visit preparation completed: Yes  Pain : 0-10 Pain Score: 3  Pain Type: Chronic pain Pain Location: Back Pain Orientation: Lower Pain Descriptors / Indicators: Aching Pain Onset: More than a month ago Pain Frequency: Constant     Nutritional Status: BMI > 30  Obese Nutritional Risks: None Diabetes: No  How often do you need to have someone help you when  you read instructions, pamphlets, or other written materials from your doctor or pharmacy?: 1 - Never What is the last grade level you completed in school?: college  Diabetic? no  Interpreter Needed?: No  Information entered by :: NAllen LPN   Activities of Daily Living In your present state of health, do you have any difficulty performing the following activities: 02/10/2021 03/04/2020  Hearing? Y N  Comment has hearing aides -  Vision? N N  Difficulty concentrating or making decisions? N N  Walking or climbing stairs? N N  Dressing or bathing? N N  Doing errands, shopping? N N  Preparing Food and eating ? N -  Using the Toilet? N -  In the past six months, have you accidently leaked urine? Y -  Do you have problems with loss of bowel control? N -  Managing your Medications? N -  Managing your Finances? N -  Housekeeping or managing your Housekeeping? N -  Some recent data might be hidden    Patient Care Team: Charlynne Cousins, MD as PCP - General Lucilla Lame, MD as Consulting Physician (Gastroenterology) Jac Canavan, MD (Unknown Physician Specialty) Emmaline Kluver., MD (Rheumatology)  Indicate any recent  Medical Services you may have received from other than [facility] providers in the past year (date may be approximate).     Assessment:   This is a routine wellness examination for Jacobi.  Hearing/Vision screen No results found.  Dietary issues and exercise activities discussed: Current Exercise Habits: The patient does not participate in regular exercise at present   Goals Addressed             This Visit's Progress    Patient Stated       02/10/2021, wants to lose 30 pounds       Depression Screen PHQ 2/9 Scores 02/10/2021 11/22/2020 10/11/2020 02/01/2020 01/05/2019 02/14/2017 08/13/2016  PHQ - 2 Score 0 0 0 0 0 0 0  PHQ- 9 Score - - 0 0 - - -    Fall Risk Fall Risk  02/10/2021 11/22/2020 10/11/2020 02/01/2020 01/05/2019  Falls in the past year? 1 0 0 0 0  Comment knocked down by dog - - - -  Number falls in past yr: 0 0 0 - -  Injury with Fall? 0 0 0 - -  Risk for fall due to : Medication side effect No Fall Risks - Medication side effect -  Follow up Falls evaluation completed;Education provided;Falls prevention discussed Falls evaluation completed Falls evaluation completed Falls evaluation completed;Education provided;Falls prevention discussed -    FALL RISK PREVENTION PERTAINING TO THE HOME:  Any stairs in or around the home? Yes  If so, are there any without handrails? No  Home free of loose throw rugs in walkways, pet beds, electrical cords, etc? Yes  Adequate lighting in your home to reduce risk of falls? Yes   ASSISTIVE DEVICES UTILIZED TO PREVENT FALLS:  Life alert? No  Use of a cane, walker or w/c? No  Grab bars in the bathroom? No  Shower chair or bench in shower? Yes  Elevated toilet seat or a handicapped toilet? Yes   TIMED UP AND GO:  Was the test performed? No .      Cognitive Function:     6CIT Screen 02/10/2021 02/01/2020 02/02/2019  What Year? 0 points 0 points 0 points  What month? 0 points 0 points 0 points  What time? 0 points 0 points 0 points   Count back from  20 0 points 0 points 0 points  Months in reverse 0 points 0 points 0 points  Repeat phrase 0 points 2 points 0 points  Total Score 0 2 0    Immunizations Immunization History  Administered Date(s) Administered   Fluad Quad(high Dose 65+) 02/02/2019   Influenza, High Dose Seasonal PF 02/26/2018, 01/21/2020   Influenza-Unspecified 02/21/2015, 01/17/2016, 01/26/2020   PFIZER Comirnaty(Gray Top)Covid-19 Tri-Sucrose Vaccine 01/07/2020   PFIZER(Purple Top)SARS-COV-2 Vaccination 06/14/2019, 07/05/2019   Pneumococcal Conjugate-13 02/26/2018   Pneumococcal Polysaccharide-23 02/23/2016   Tdap 12/17/2013   Unspecified SARS-COV-2 Vaccination 09/10/2020   Zoster Recombinat (Shingrix) 03/16/2019, 09/19/2019, 10/20/2019   Zoster, Live 02/23/2016    TDAP status: Up to date  Flu Vaccine status: Up to date  Pneumococcal vaccine status: Up to date  Covid-19 vaccine status: Completed vaccines  Qualifies for Shingles Vaccine? Yes   Zostavax completed Yes   Shingrix Completed?: Yes  Screening Tests Health Maintenance  Topic Date Due   COVID-19 Vaccine (5 - Booster for [place] series) 01/10/2021   TETANUS/TDAP  12/18/2023   COLONOSCOPY (Pts 45-50yrs Insurance coverage will need to be confirmed)  07/22/2025   INFLUENZA VACCINE  Completed   Hepatitis C Screening  Completed   Zoster Vaccines- Shingrix  Completed   HPV VACCINES  Aged Out    Health Maintenance  Health Maintenance Due  Topic Date Due   COVID-19 Vaccine (5 - Booster for [place] series) 01/10/2021    Colorectal cancer screening: Type of screening: Colonoscopy. Completed 07/23/2018. Repeat every 7 years  Lung Cancer Screening: (Low Dose CT Chest recommended if Age 9-80 years, 30 pack-year currently smoking OR have quit w/in 15years.) does not qualify.   Lung Cancer Screening Referral: no  Additional Screening:  Hepatitis C Screening: does qualify; Completed 08/03/2015  Vision Screening: Recommended annual  ophthalmology exams for early detection of glaucoma and other disorders of the eye. Is the patient up to date with their annual eye exam?  Yes  Who is the provider or what is the name of the office in which the patient attends annual eye exams? [facility] If pt is not established with a provider, would they like to be referred to a provider to establish care? No .   Dental Screening: Recommended annual dental exams for proper oral hygiene  Community Resource Referral / Chronic Care Management: CRR required this visit?  No   CCM required this visit?  No      Plan:     I have personally reviewed and noted the following in the patient's chart:   Medical and social history Use of alcohol, tobacco or illicit drugs  Current medications and supplements including opioid prescriptions. Patient is not currently taking opioid prescriptions. Functional ability and status Nutritional status Physical activity Advanced directives List of other physicians Hospitalizations, surgeries, and ER visits in previous 12 months Vitals Screenings to include cognitive, depression, and falls Referrals and appointments  In addition, I have reviewed and discussed with patient certain preventive protocols, quality metrics, and best practice recommendations. A written personalized care plan for preventive services as well as general preventive health recommendations were provided to patient.     Kellie Simmering, LPN   2/75/1700   Nurse Notes:

## 2021-02-10 NOTE — Patient Instructions (Signed)
Paul Rodriguez , Thank you for taking time to come for your Medicare Wellness Visit. I appreciate your ongoing commitment to your health goals. Please review the following plan we discussed and let me know if I can assist you in the future.   Screening recommendations/referrals: Colonoscopy: completed 07/23/2018 Recommended yearly ophthalmology/optometry visit for glaucoma screening and checkup Recommended yearly dental visit for hygiene and checkup  Vaccinations: Influenza vaccine: completed 01/30/2021 Pneumococcal vaccine: completed 02/26/2018 Tdap vaccine: completed 12/17/2013, due 12/18/2023 Shingles vaccine: completed   Covid-19: 02/03/2021, 09/10/2020, 01/07/2020, 07/05/2019, 06/14/2019  Advanced directives: Please bring a copy of your POA (Power of Attorney) and/or Living Will to your next appointment.   Conditions/risks identified: none  Next appointment: Follow up in one year for your annual wellness visit.   Preventive Care 80 Years and Older, Male Preventive care refers to lifestyle choices and visits with your health care provider that can promote health and wellness. What does preventive care include? A yearly physical exam. This is also called an annual well check. Dental exams once or twice a year. Routine eye exams. Ask your health care provider how often you should have your eyes checked. Personal lifestyle choices, including: Daily care of your teeth and gums. Regular physical activity. Eating a healthy diet. Avoiding tobacco and drug use. Limiting alcohol use. Practicing safe sex. Taking low doses of aspirin every day. Taking vitamin and mineral supplements as recommended by your health care provider. What happens during an annual well check? The services and screenings done by your health care provider during your annual well check will depend on your age, overall health, lifestyle risk factors, and family history of disease. Counseling  Your health care provider may ask  you questions about your: Alcohol use. Tobacco use. Drug use. Emotional well-being. Home and relationship well-being. Sexual activity. Eating habits. History of falls. Memory and ability to understand (cognition). Work and work Statistician. Screening  You may have the following tests or measurements: Height, weight, and BMI. Blood pressure. Lipid and cholesterol levels. These may be checked every 5 years, or more frequently if you are over 88 years old. Skin check. Lung cancer screening. You may have this screening every year starting at age 13 if you have a 30-pack-year history of smoking and currently smoke or have quit within the past 15 years. Fecal occult blood test (FOBT) of the stool. You may have this test every year starting at age 72. Flexible sigmoidoscopy or colonoscopy. You may have a sigmoidoscopy every 5 years or a colonoscopy every 10 years starting at age 87. Prostate cancer screening. Recommendations will vary depending on your family history and other risks. Hepatitis C blood test. Hepatitis B blood test. Sexually transmitted disease (STD) testing. Diabetes screening. This is done by checking your blood sugar (glucose) after you have not eaten for a while (fasting). You may have this done every 1-3 years. Abdominal aortic aneurysm (AAA) screening. You may need this if you are a current or former smoker. Osteoporosis. You may be screened starting at age 34 if you are at high risk. Talk with your health care provider about your test results, treatment options, and if necessary, the need for more tests. Vaccines  Your health care provider may recommend certain vaccines, such as: Influenza vaccine. This is recommended every year. Tetanus, diphtheria, and acellular pertussis (Tdap, Td) vaccine. You may need a Td booster every 10 years. Zoster vaccine. You may need this after age 6. Pneumococcal 13-valent conjugate (PCV13) vaccine. One dose is  recommended after age  76. Pneumococcal polysaccharide (PPSV23) vaccine. One dose is recommended after age 10. Talk to your health care provider about which screenings and vaccines you need and how often you need them. This information is not intended to replace advice given to you by your health care provider. Make sure you discuss any questions you have with your health care provider. Document Released: 06/03/2015 Document Revised: 01/25/2016 Document Reviewed: 03/08/2015 Elsevier Interactive Patient Education  2017 [employer] Prevention in the Home Falls can cause injuries. They can happen to people of all ages. There are many things you can do to make your home safe and to help prevent falls. What can I do on the outside of my home? Regularly fix the edges of walkways and driveways and fix any cracks. Remove anything that might make you trip as you walk through a door, such as a raised step or threshold. Trim any bushes or trees on the path to your home. Use bright outdoor lighting. Clear any walking paths of anything that might make someone trip, such as rocks or tools. Regularly check to see if handrails are loose or broken. Make sure that both sides of any steps have handrails. Any raised decks and porches should have guardrails on the edges. Have any leaves, snow, or ice cleared regularly. Use sand or salt on walking paths during winter. Clean up any spills in your garage right away. This includes oil or grease spills. What can I do in the bathroom? Use night lights. Install grab bars by the toilet and in the tub and shower. Do not use towel bars as grab bars. Use non-skid mats or decals in the tub or shower. If you need to sit down in the shower, use a plastic, non-slip stool. Keep the floor dry. Clean up any water that spills on the floor as soon as it happens. Remove soap buildup in the tub or shower regularly. Attach bath mats securely with double-sided non-slip rug tape. Do not have throw  rugs and other things on the floor that can make you trip. What can I do in the bedroom? Use night lights. Make sure that you have a light by your bed that is easy to reach. Do not use any sheets or blankets that are too big for your bed. They should not hang down onto the floor. Have a firm chair that has side arms. You can use this for support while you get dressed. Do not have throw rugs and other things on the floor that can make you trip. What can I do in the kitchen? Clean up any spills right away. Avoid walking on wet floors. Keep items that you use a lot in easy-to-reach places. If you need to reach something above you, use a strong step stool that has a grab bar. Keep electrical cords out of the way. Do not use floor polish or wax that makes floors slippery. If you must use wax, use non-skid floor wax. Do not have throw rugs and other things on the floor that can make you trip. What can I do with my stairs? Do not leave any items on the stairs. Make sure that there are handrails on both sides of the stairs and use them. Fix handrails that are broken or loose. Make sure that handrails are as long as the stairways. Check any carpeting to make sure that it is firmly attached to the stairs. Fix any carpet that is loose or worn. Avoid having  throw rugs at the top or bottom of the stairs. If you do have throw rugs, attach them to the floor with carpet tape. Make sure that you have a light switch at the top of the stairs and the bottom of the stairs. If you do not have them, ask someone to add them for you. What else can I do to help prevent falls? Wear shoes that: Do not have high heels. Have rubber bottoms. Are comfortable and fit you well. Are closed at the toe. Do not wear sandals. If you use a stepladder: Make sure that it is fully opened. Do not climb a closed stepladder. Make sure that both sides of the stepladder are locked into place. Ask someone to hold it for you, if  possible. Clearly mark and make sure that you can see: Any grab bars or handrails. First and last steps. Where the edge of each step is. Use tools that help you move around (mobility aids) if they are needed. These include: Canes. Walkers. Scooters. Crutches. Turn on the lights when you go into a dark area. Replace any light bulbs as soon as they burn out. Set up your furniture so you have a clear path. Avoid moving your furniture around. If any of your floors are uneven, fix them. If there are any pets around you, be aware of where they are. Review your medicines with your doctor. Some medicines can make you feel dizzy. This can increase your chance of falling. Ask your doctor what other things that you can do to help prevent falls. This information is not intended to replace advice given to you by your health care provider. Make sure you discuss any questions you have with your health care provider. Document Released: 03/03/2009 Document Revised: 10/13/2015 Document Reviewed: 06/11/2014 Elsevier Interactive Patient Education  2017 [employer].

## 2021-02-14 ENCOUNTER — Other Ambulatory Visit: Payer: Medicare Other

## 2021-02-14 DIAGNOSIS — M48062 Spinal stenosis, lumbar region with neurogenic claudication: Secondary | ICD-10-CM | POA: Diagnosis not present

## 2021-02-15 ENCOUNTER — Ambulatory Visit
Admission: RE | Admit: 2021-02-15 | Discharge: 2021-02-15 | Disposition: A | Discharge status: Home / Self Care | Payer: Medicare Other | Source: Ambulatory Visit | Attending: Neurosurgery | Admitting: Neurosurgery

## 2021-02-15 ENCOUNTER — Other Ambulatory Visit: Payer: Self-pay

## 2021-02-15 ENCOUNTER — Other Ambulatory Visit: Payer: Medicare Other

## 2021-02-15 DIAGNOSIS — R600 Localized edema: Secondary | ICD-10-CM | POA: Diagnosis not present

## 2021-02-15 DIAGNOSIS — D1800 Hemangioma unspecified site: Secondary | ICD-10-CM | POA: Diagnosis not present

## 2021-02-15 DIAGNOSIS — M5414 Radiculopathy, thoracic region: Secondary | ICD-10-CM | POA: Insufficient documentation

## 2021-02-15 DIAGNOSIS — M5134 Other intervertebral disc degeneration, thoracic region: Secondary | ICD-10-CM | POA: Diagnosis not present

## 2021-02-15 DIAGNOSIS — E7800 Pure hypercholesterolemia, unspecified: Secondary | ICD-10-CM

## 2021-02-15 DIAGNOSIS — M5125 Other intervertebral disc displacement, thoracolumbar region: Secondary | ICD-10-CM | POA: Diagnosis not present

## 2021-02-15 IMAGING — MR MR THORACIC SPINE W/O CM
5 of 6 series · 28 of 48 positions shown · non-contrast
Comparison: CT of the thoracic spine [DATE].

CLINICAL DATA: Thoracic radiculopathy. Additional history provided:
Recent fall with resultant thoracic spine pain.

EXAM:
MRI THORACIC SPINE WITHOUT CONTRAST
TECHNIQUE: Multiplanar, multisequence MR imaging of the thoracic spine was
performed. No intravenous contrast was administered.

[Series 16: T1 · sagittal · 5.0mm · 1.88mm/px · 2 of 9 slices shown (1 of 2)]
[im 1/9]
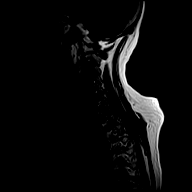
[im 9/9]
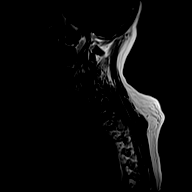

[Series 17: T2 · sagittal · 3.0mm · 1.06mm/px · 6 of 17 slices shown (1 of 2)]
[im 1/17]
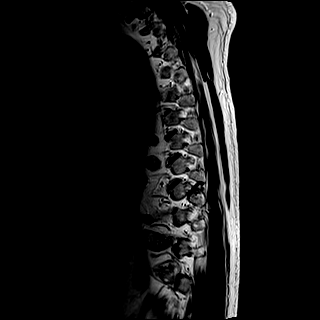
[im 4/17]
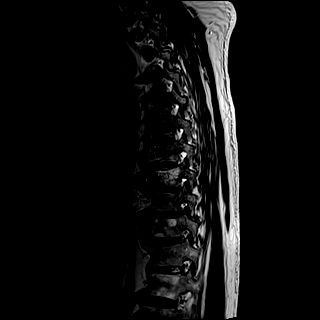
[im 7/17]
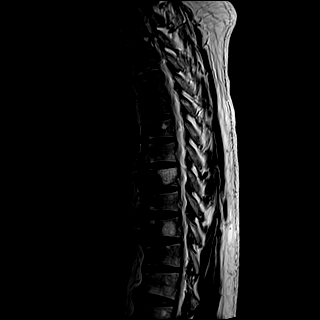
[im 10/17]
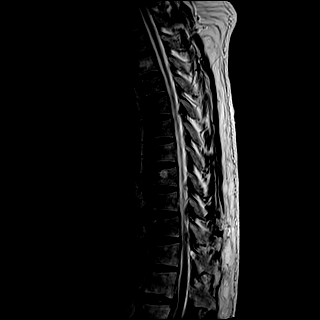
[im 13/17]
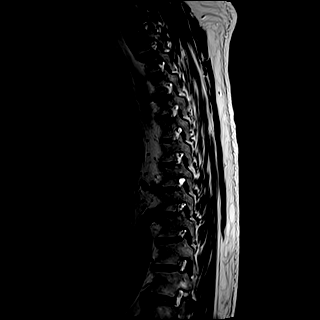
[im 17/17]
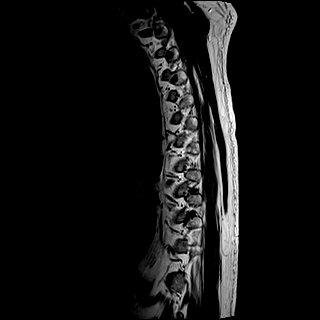

[Series 18: T1 · sagittal · 3.0mm · 1.06mm/px · 6 of 17 slices shown (2 of 2)]
[im 1/17]
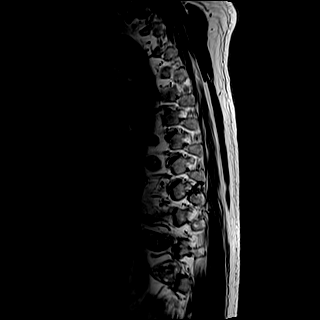
[im 4/17]
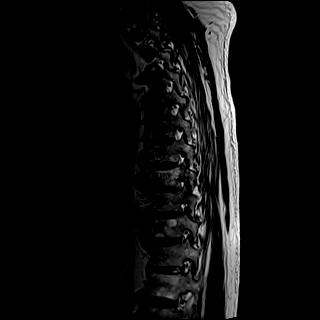
[im 7/17]
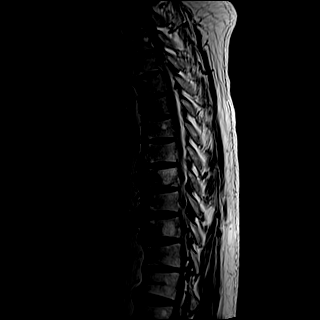
[im 10/17]
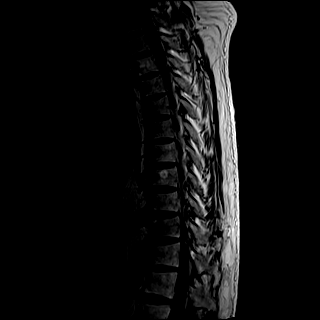
[im 13/17]
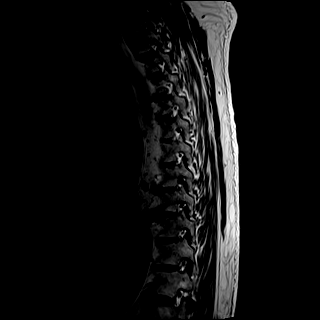
[im 17/17]
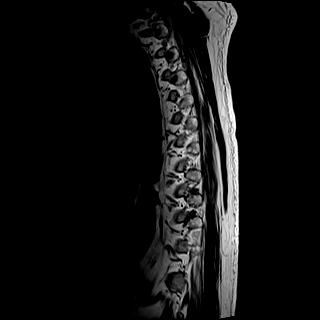

[Series 19: STIR · sagittal · 3.0mm · 0.53mm/px · 6 of 17 slices shown]
[im 1/17]
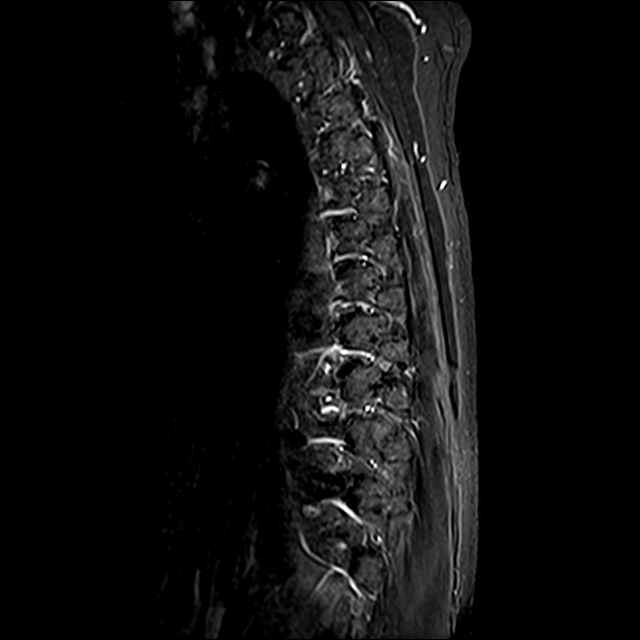
[im 4/17]
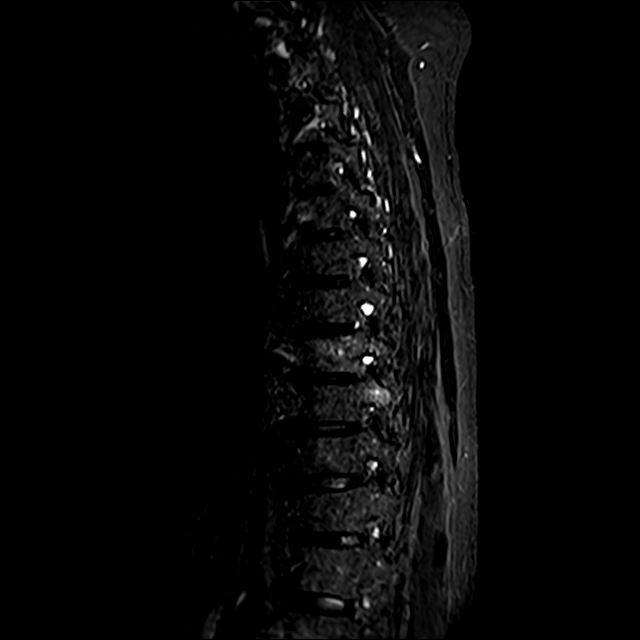
[im 7/17]
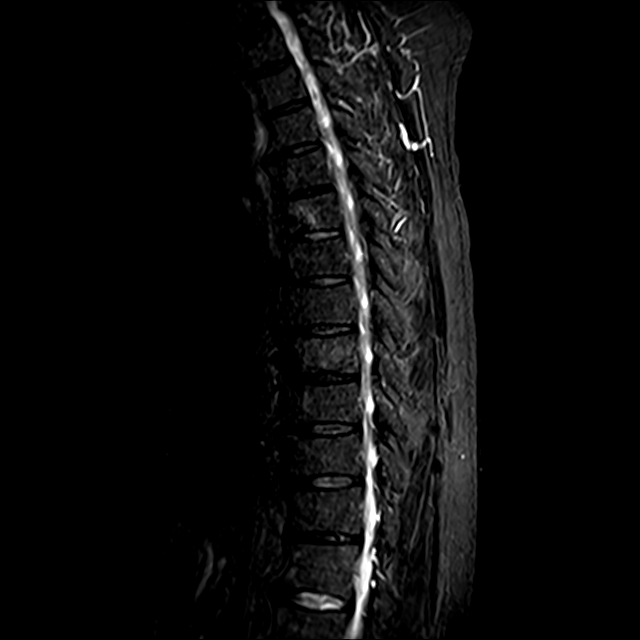
[im 10/17]
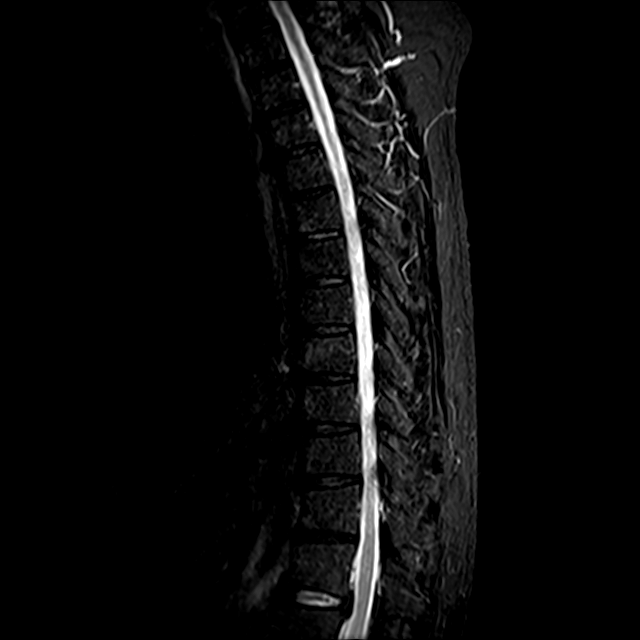
[im 13/17]
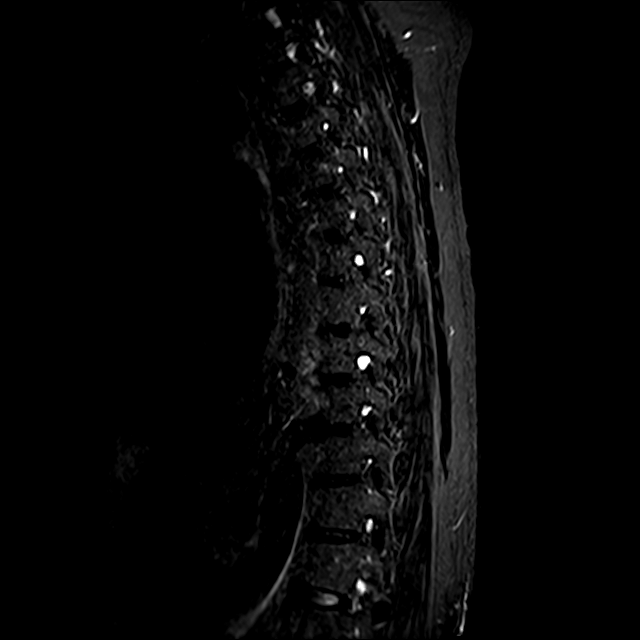
[im 17/17]
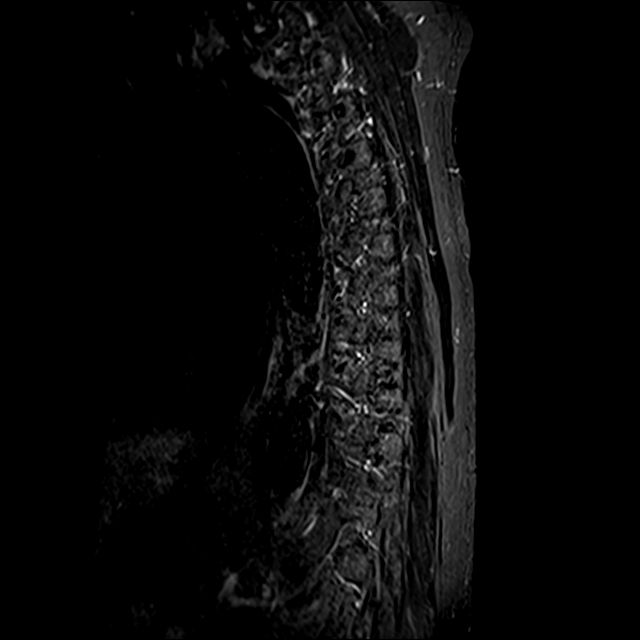

[Series 20: T2 · axial · 4.0mm · 0.59mm/px · z∈[-389,-112]mm · 8 of 39 slices shown (2 of 2)]
[im 1/39]
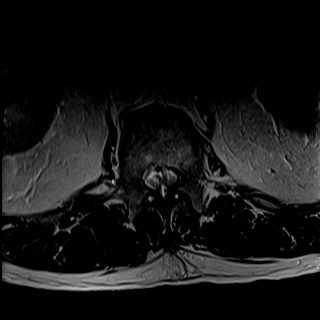
[im 6/39]
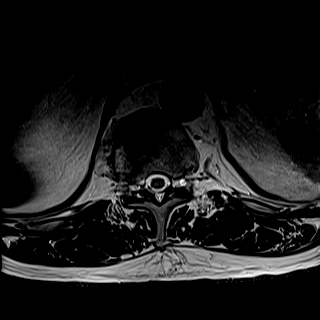
[im 12/39]
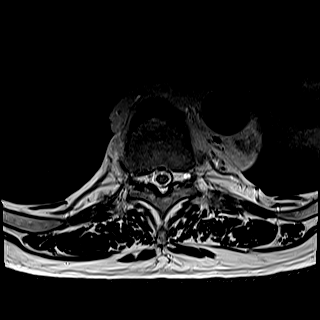
[im 18/39]
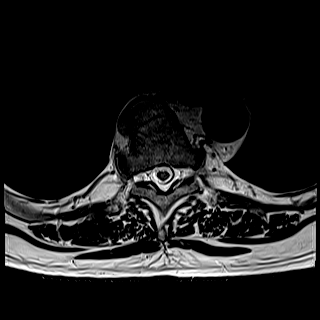
[im 21/39]
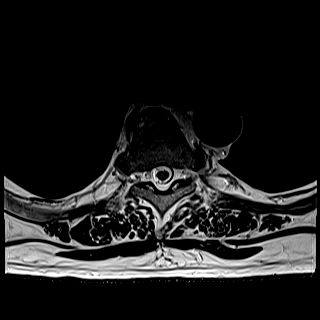
[im 27/39]
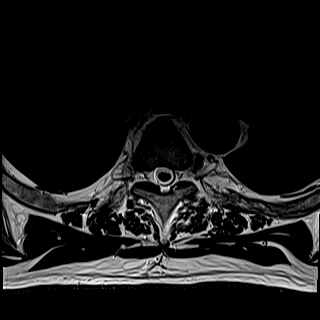
[im 33/39]
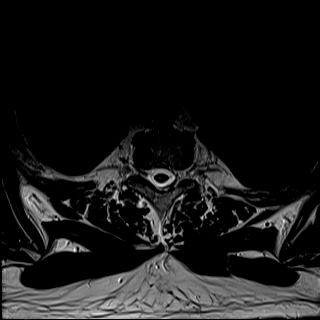
[im 39/39]
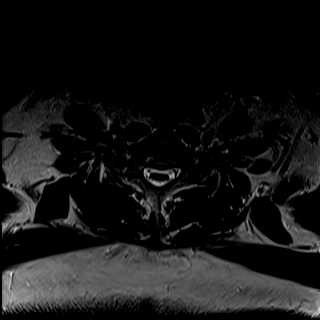

[28 of 48 positions shown; findings below may reference images not displayed]

FINDINGS: Alignment: Thoracic dextrocurvature. No significant
spondylolisthesis.

Vertebrae: Linear focus of T1 hypointense and STIR hyperintense
signal abnormality within the anteroinferior aspect of the T5
vertebral body to the right, suspicious for nondisplaced subacute
anterior column fracture given the provided history (series 17,
image 6) (series 18, image 6) (series 19, images 6-8). There is no
significant vertebral body height loss, and this finding is not
appreciable on the prior CT thoracic spine of [DATE].
Redemonstrated mild chronic anterior wedge deformities of the T11
and T12 vertebral bodies. Mild edema within the right T9
pedicle/articular pillar, likely degenerative and related to facet
arthrosis. Multilevel vertebral body hemangiomas. Multilevel
bridging and flowing ventrolateral osteophytes within the thoracic
spine, with relatively preserved intervertebral disc spaces. These
findings raise the possibility of diffuse idiopathic skeletal
hyperostosis (DISH)

Cord:  No spinal cord signal abnormality is identified.

Paraspinal and other soft tissues: No abnormality identified within
included portions of the thorax or upper abdomen/retroperitoneum.
Paraspinal soft tissues unremarkable.

Disc levels:

Multilevel disc degeneration, greatest at T3-T4 (mild/moderate).
There are shallow multilevel disc bulges. At T12-L1, there is a
small central disc protrusion resulting in mild spinal canal
narrowing, and possibly contacting the ventral spinal cord. No
significant spinal canal stenosis at the remaining levels, or
significant thoracic foraminal stenosis.

Impression #1 will be called to the ordering clinician or
representative by the Radiologist Assistant, and communication
documented in the PACS or [REDACTED].
IMPRESSION: Linear focus of signal abnormality within the anteroinferior aspect
of the T5 vertebral body, as described and suspicious for a
nondisplaced subacute anterior column fracture. There is no
associated vertebral body height loss, and this finding is not
appreciable on the prior CT thoracic spine of [DATE].

Redemonstrated mild chronic anterior wedging of the T11 and T12
vertebral bodies.

Findings raising the possibility of diffuse idiopathic skeletal
hyperostosis (DISH).

Thoracic spondylosis, as outlined. Most notably at T12-L1, a small
central disc protrusion mildly narrows the spinal canal, and may
contact the ventral spinal cord.

Thoracic dextrocurvature.

## 2021-02-16 LAB — CMP14+EGFR
ALT: 29 IU/L (ref 0–44)
AST: 24 IU/L (ref 0–40)
Albumin/Globulin Ratio: 2.3 — ABNORMAL HIGH (ref 1.2–2.2)
Albumin: 4.6 g/dL (ref 3.8–4.8)
Alkaline Phosphatase: 56 IU/L (ref 44–121)
BUN/Creatinine Ratio: 19 (ref 10–24)
BUN: 24 mg/dL (ref 8–27)
Bilirubin Total: 0.4 mg/dL (ref 0.0–1.2)
CO2: 21 mmol/L (ref 20–29)
Calcium: 9.4 mg/dL (ref 8.6–10.2)
Chloride: 106 mmol/L (ref 96–106)
Creatinine, Ser: 1.27 mg/dL (ref 0.76–1.27)
Globulin, Total: 2 g/dL (ref 1.5–4.5)
Glucose: 103 mg/dL — ABNORMAL HIGH (ref 70–99)
Potassium: 5.1 mmol/L (ref 3.5–5.2)
Sodium: 139 mmol/L (ref 134–144)
Total Protein: 6.6 g/dL (ref 6.0–8.5)
eGFR: 61 mL/min/{1.73_m2} (ref >59)

## 2021-02-16 LAB — LIPID PANEL
Chol/HDL Ratio: 2.8 ratio (ref 0.0–5.0)
Cholesterol, Total: 161 mg/dL (ref 100–199)
HDL: 57 mg/dL (ref >39)
LDL Chol Calc (NIH): 88 mg/dL (ref 0–99)
Triglycerides: 86 mg/dL (ref 0–149)
VLDL Cholesterol Cal: 16 mg/dL (ref 5–40)

## 2021-02-17 ENCOUNTER — Other Ambulatory Visit: Payer: Self-pay | Admitting: Neurosurgery

## 2021-02-17 DIAGNOSIS — M48062 Spinal stenosis, lumbar region with neurogenic claudication: Secondary | ICD-10-CM

## 2021-02-22 ENCOUNTER — Ambulatory Visit (INDEPENDENT_AMBULATORY_CARE_PROVIDER_SITE_OTHER): Payer: Medicare Other | Admitting: Internal Medicine

## 2021-02-22 ENCOUNTER — Other Ambulatory Visit: Payer: Self-pay

## 2021-02-22 ENCOUNTER — Encounter: Payer: Self-pay | Admitting: Internal Medicine

## 2021-02-22 VITALS — BP 144/83 | HR 74 | Temp 99.6°F | Ht 69.02 in | Wt 243.4 lb

## 2021-02-22 DIAGNOSIS — M06042 Rheumatoid arthritis without rheumatoid factor, left hand: Secondary | ICD-10-CM

## 2021-02-22 DIAGNOSIS — M06041 Rheumatoid arthritis without rheumatoid factor, right hand: Secondary | ICD-10-CM

## 2021-02-22 DIAGNOSIS — E785 Hyperlipidemia, unspecified: Secondary | ICD-10-CM | POA: Diagnosis not present

## 2021-02-22 NOTE — Progress Notes (Signed)
BP (!) 144/83   Pulse 74   Temp 99.6 F (37.6 C) (Oral)   Ht 5' 9.02" (1.753 m)   Wt 243 lb 6.4 oz (110.4 kg)   SpO2 98%   BMI 35.93 kg/m    Subjective:    Patient ID: Paul Rodriguez, male    DOB: 1950-06-25, 70 y.o.   MRN: [id number]  Chief Complaint  Patient presents with  . Hypertension  . Hyperlipidemia  . Chronic Kidney Disease  . Erectile Dysfunction    HPI: Paul Rodriguez is a 70 y.o. male  Hypertension Pertinent negatives include no chest pain, headaches, palpitations or shortness of breath.  Hyperlipidemia Pertinent negatives include no chest pain, myalgias or shortness of breath.  Erectile Dysfunction Irritative symptoms do not include frequency or urgency. Pertinent negatives include no chills, dysuria or hematuria.   Chief Complaint  Patient presents with  . Hypertension  . Hyperlipidemia  . Chronic Kidney Disease  . Erectile Dysfunction    Relevant past medical, surgical, family and social history reviewed and updated as indicated. Interim medical history since our last visit reviewed. Allergies and medications reviewed and updated.  Review of Systems  Constitutional:  Negative for activity change, appetite change, chills, fatigue and fever.  HENT:  Negative for congestion, ear discharge, ear pain and facial swelling.   Eyes:  Negative for pain, discharge and itching.  Respiratory:  Negative for cough, chest tightness, shortness of breath and wheezing.   Cardiovascular:  Negative for chest pain, palpitations and leg swelling.  Gastrointestinal:  Negative for abdominal distention, abdominal pain, blood in stool, constipation, diarrhea, nausea and vomiting.  Endocrine: Negative for cold intolerance, heat intolerance, polydipsia, polyphagia and polyuria.  Genitourinary:  Negative for difficulty urinating, dysuria, flank pain, frequency, hematuria and urgency.  Musculoskeletal:  Positive for back pain. Negative for arthralgias, gait problem and  myalgias.  Skin:  Negative for color change and wound.  Neurological:  Negative for dizziness, tremors, speech difficulty, weakness, light-headedness, numbness and headaches.  Hematological:  Does not bruise/bleed easily.  Psychiatric/Behavioral:  Negative for agitation, confusion, decreased concentration, sleep disturbance and suicidal ideas.    Per HPI unless specifically indicated above     Objective:    BP (!) 144/83   Pulse 74   Temp 99.6 F (37.6 C) (Oral)   Ht 5' 9.02" (1.753 m)   Wt 243 lb 6.4 oz (110.4 kg)   SpO2 98%   BMI 35.93 kg/m   Wt Readings from Last 3 Encounters:  02/22/21 243 lb 6.4 oz (110.4 kg)  02/10/21 230 lb (104.3 kg)  11/22/20 243 lb (110.2 kg)    Physical Exam Vitals and nursing note reviewed.  Constitutional:      General: He is not in acute distress.    Appearance: Normal appearance. He is not ill-appearing or diaphoretic.  HENT:     Head: Normocephalic and atraumatic.     Right Ear: Tympanic membrane and external ear normal. There is no impacted cerumen.     Left Ear: External ear normal.     Nose: No congestion or rhinorrhea.     Mouth/Throat:     Pharynx: No oropharyngeal exudate or posterior oropharyngeal erythema.  Eyes:     Conjunctiva/sclera: Conjunctivae normal.     Pupils: Pupils are equal, round, and reactive to light.  Cardiovascular:     Rate and Rhythm: Normal rate and regular rhythm.     Heart sounds: No murmur heard.   No friction rub. No gallop.  Pulmonary:     Effort: No respiratory distress.     Breath sounds: No stridor. No wheezing or rhonchi.  Chest:     Chest wall: No tenderness.  Abdominal:     General: Abdomen is flat. Bowel sounds are normal.     Palpations: Abdomen is soft. There is no mass.     Tenderness: There is no abdominal tenderness.  Musculoskeletal:        General: Tenderness and deformity present.     Cervical back: Normal range of motion and neck supple. No rigidity or tenderness.     Left lower  leg: No edema.  Skin:    General: Skin is warm and dry.  Neurological:     General: No focal deficit present.     Mental Status: He is alert and oriented to person, place, and time.  Psychiatric:        Mood and Affect: Mood normal.        Behavior: Behavior normal.   Results for orders placed or performed in visit on 02/15/21  CMP14+EGFR  Result Value Ref Range   Glucose 103 (H) 70 - 99 mg/dL   BUN 24 8 - 27 mg/dL   Creatinine, Ser 1.27 0.76 - 1.27 mg/dL   eGFR 61 >59 mL/min/1.73   BUN/Creatinine Ratio 19 10 - 24   Sodium 139 134 - 144 mmol/L   Potassium 5.1 3.5 - 5.2 mmol/L   Chloride 106 96 - 106 mmol/L   CO2 21 20 - 29 mmol/L   Calcium 9.4 8.6 - 10.2 mg/dL   Total Protein 6.6 6.0 - 8.5 g/dL   Albumin 4.6 3.8 - 4.8 g/dL   Globulin, Total 2.0 1.5 - 4.5 g/dL   Albumin/Globulin Ratio 2.3 (H) 1.2 - 2.2   Bilirubin Total 0.4 0.0 - 1.2 mg/dL   Alkaline Phosphatase 56 44 - 121 IU/L   AST 24 0 - 40 IU/L   ALT 29 0 - 44 IU/L  Lipid panel  Result Value Ref Range   Cholesterol, Total 161 100 - 199 mg/dL   Triglycerides 86 0 - 149 mg/dL   HDL 57 >39 mg/dL   VLDL Cholesterol Cal 16 5 - 40 mg/dL   LDL Chol Calc (NIH) 88 0 - 99 mg/dL   Chol/HDL Ratio 2.8 0.0 - 5.0 ratio        Current Outpatient Medications:  .  Cholecalciferol (VITAMIN D3) 25 MCG (1000 UT) CAPS, Take 3,000 Units by mouth daily., Disp: , Rfl:  .  EPINEPHrine 0.3 mg/0.3 mL IJ SOAJ injection, , Disp: , Rfl:  .  fenofibrate (TRICOR) 145 MG tablet, Take 1 tablet (145 mg total) by mouth daily., Disp: 30 tablet, Rfl: 4 .  FOLIC ACID PO, Take by mouth daily., Disp: , Rfl:  .  ibuprofen (ADVIL,MOTRIN) 200 MG tablet, Take 400 mg by mouth daily., Disp: , Rfl:  .  loratadine-pseudoephedrine (CLARITIN-D 12-HOUR) 5-120 MG tablet, Take by mouth every 12 (twelve) hours as needed., Disp: , Rfl:  .  methotrexate (RHEUMATREX) 2.5 MG tablet, Take 15 mg by mouth once a week. , Disp: , Rfl:  .  Multiple Vitamin (MULTIVITAMIN)  tablet, Take 1 tablet by mouth daily., Disp: , Rfl:  .  Omega-3 Fatty Acids (FISH OIL) 1000 MG CAPS, Take 1,000 mg by mouth daily., Disp: , Rfl:  .  omeprazole (PRILOSEC) 20 MG capsule, Take 20 mg by mouth daily., Disp: , Rfl:  .  valsartan (DIOVAN) 160 MG tablet, Take by mouth.,  Disp: , Rfl:  .  vitamin B-12 (CYANOCOBALAMIN) 1000 MCG tablet, Take 1,000 mcg by mouth daily., Disp: , Rfl:  .  tiZANidine (ZANAFLEX) 4 MG tablet, Take 4 mg by mouth 2 (two) times daily., Disp: , Rfl:     Assessment & Plan:  HTN losartan - valsartan per cards. To see him back in 1 week  Sees Dr. Nehemiah Massed bp wa uncontrolled per pt.  Stress test - no results ECHO - INTERPRETATION  NORMAL LEFT VENTRICULAR SYSTOLIC FUNCTION NORMAL RIGHT VENTRICULAR SYSTOLIC FUNCTION NO VALVULAR STENOSIS TRIVIAL MR, TR EF >55%  2. RA chroinc, stable, asymptomatic from such currently. is on MTX q weekly sees rheumatology for such   3. HLD - TG - better form 191 - 86 on fenofibrate for such  recheck FLP, check LFT's work on diet, SE of meds explained to pt. low fat and high fiber diet explained to pt.  4. Spinal stenosis @ [place] [facility] @ [place] To  ? Have a myelogram  No notes available from neurosurgeon Linear focus of signal abnormality within the anteroinferior aspect of the T5 vertebral body, as described and suspicious for a nondisplaced subacute anterior column fracture. There is no associated vertebral body height loss, and this finding is not appreciable on the prior CT thoracic spine of 01/10/2021.   Redemonstrated mild chronic anterior wedging of the T11 and T12 vertebral bodies.   Findings raising the possibility of diffuse idiopathic skeletal hyperostosis (DISH).   Thoracic spondylosis, as outlined. Most notably at T12-L1, a small central disc protrusion mildly narrows the spinal canal, and may contact the ventral spinal cord.   Thoracic dextrocurvature.  5. Right heel pain sec to bony spurs ?  To see Podiatry  Has OA and RA    Problem List Items Addressed This Visit   None    No orders of the defined types were placed in this encounter.    No orders of the defined types were placed in this encounter.    Follow up plan: No follow-ups on file.

## 2021-02-23 ENCOUNTER — Telehealth: Payer: Self-pay | Admitting: Internal Medicine

## 2021-02-23 DIAGNOSIS — E785 Hyperlipidemia, unspecified: Secondary | ICD-10-CM

## 2021-02-23 NOTE — Telephone Encounter (Signed)
Pt received #90 instead of #30 with 4 refills on 11-24-2020 and only has about 5 pills left of fenofibrate 145 mg. Pt would like another #90 fenofibrate sent to [address] in [place]

## 2021-02-27 DIAGNOSIS — E782 Mixed hyperlipidemia: Secondary | ICD-10-CM | POA: Diagnosis not present

## 2021-02-27 DIAGNOSIS — I10 Essential (primary) hypertension: Secondary | ICD-10-CM | POA: Diagnosis not present

## 2021-02-27 DIAGNOSIS — R0602 Shortness of breath: Secondary | ICD-10-CM | POA: Diagnosis not present

## 2021-02-28 MED ORDER — FENOFIBRATE 145 MG PO TABS: 145.0000 mg | ORAL_TABLET | Freq: Every day | ORAL | 4 refills | Status: DC

## 2021-02-28 NOTE — Telephone Encounter (Signed)
Pt is very upset that he has not gotten #90 fenofibrate listed in encounter. States should not have been Medicare that turned him down as he uses [facility]. Pt started encounter on the 6th and is now completely. He wants to resubmit to Dr V and to please have someone reach out if there is any problem. Pt can be reached at [phone]

## 2021-02-28 NOTE — Telephone Encounter (Signed)
Ok to send 90 days supply

## 2021-03-01 ENCOUNTER — Ambulatory Visit
Admission: RE | Admit: 2021-03-01 | Discharge: 2021-03-01 | Disposition: A | Discharge status: Home / Self Care | Payer: Medicare Other | Source: Ambulatory Visit | Attending: Neurosurgery | Admitting: Neurosurgery

## 2021-03-01 DIAGNOSIS — M5126 Other intervertebral disc displacement, lumbar region: Secondary | ICD-10-CM | POA: Diagnosis not present

## 2021-03-01 DIAGNOSIS — M48062 Spinal stenosis, lumbar region with neurogenic claudication: Secondary | ICD-10-CM

## 2021-03-01 DIAGNOSIS — M48061 Spinal stenosis, lumbar region without neurogenic claudication: Secondary | ICD-10-CM | POA: Diagnosis not present

## 2021-03-01 DIAGNOSIS — M5416 Radiculopathy, lumbar region: Secondary | ICD-10-CM | POA: Diagnosis not present

## 2021-03-01 IMAGING — CT CT L SPINE W/ CM
1 of 7 series · 6 of 14 positions shown, 8 images · non-contrast
Comparison: Lumbar spine MRI [DATE]

CLINICAL DATA: Spinal stenosis, lumbar region with neurogenic
claudication. Low back pain radiating into the posterior aspects of
the right greater than left legs.
TECHNIQUE: Contiguous axial images were obtained through the Lumbar spine after
the intrathecal infusion of contrast. Coronal and sagittal
reconstructions were obtained of the axial image sets.

[Series 3: l spine soft · axial · 0.33mm/px · z∈[-290,-84]mm · 6 of 145 slices shown, 8 images]
[im 21/145  soft-tissue]
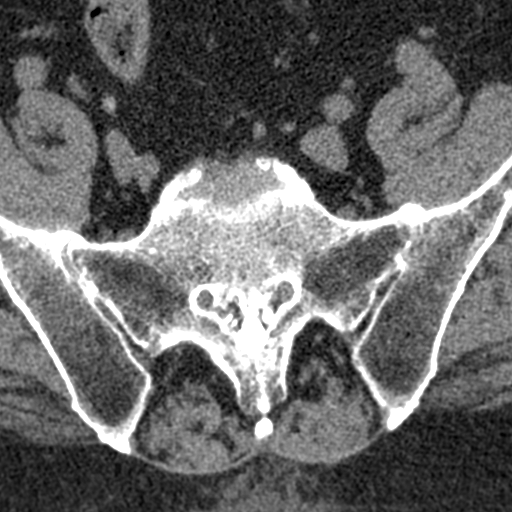
[im 21/145  bone]
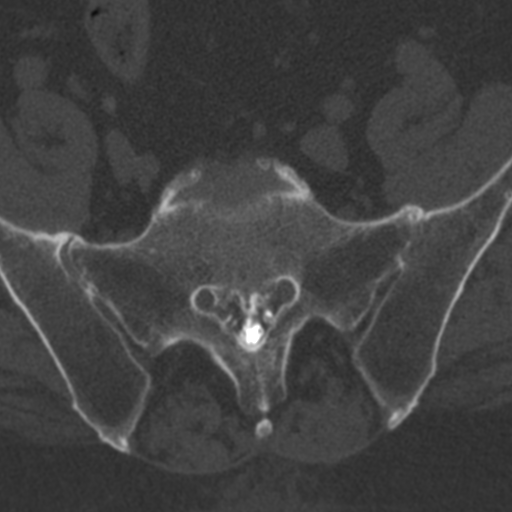
[im 42/145  bone]
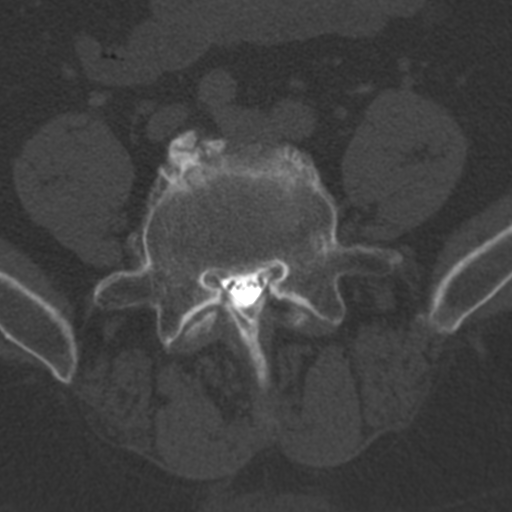
[im 62/145  bone]
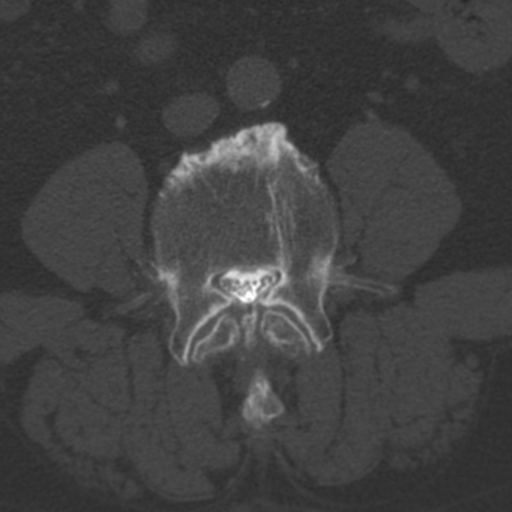
[im 83/145  bone]
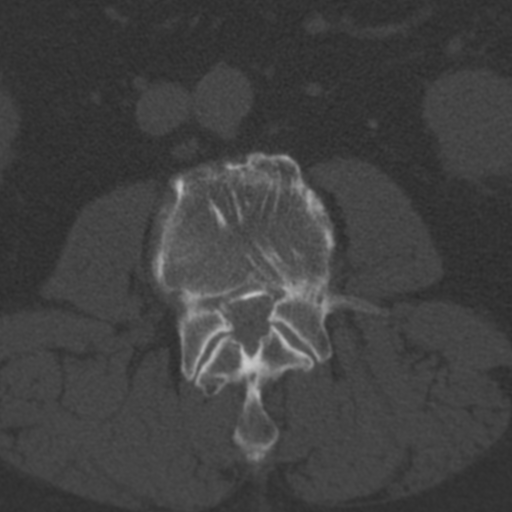
[im 103/145  soft-tissue]
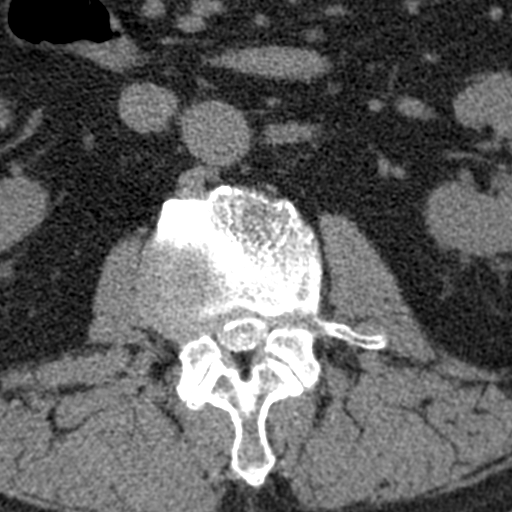
[im 103/145  bone]
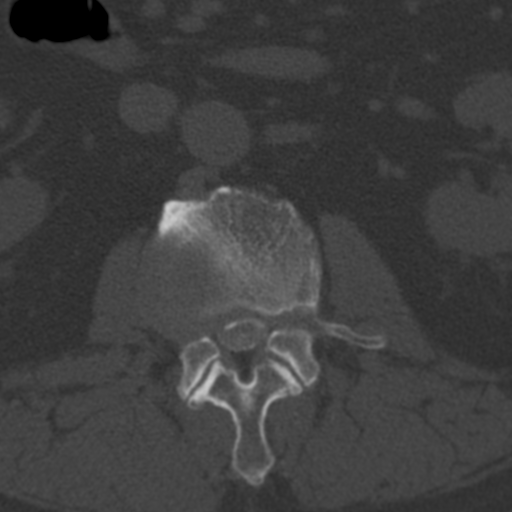
[im 124/145  bone]
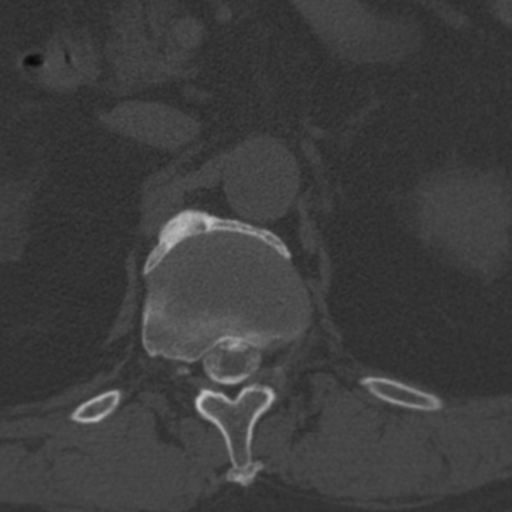

[6 of 14 positions shown; findings below may reference images not displayed]

EXAM:
LUMBAR MYELOGRAM

FLUOROSCOPY TIME:  Fluoroscopy Time: 1 minute 57 seconds

Radiation Exposure Index: 524.55 microGray*m^2

PROCEDURE:
After thorough discussion of risks and benefits of the procedure
including bleeding, infection, injury to nerves, blood vessels,
adjacent structures as well as headache and CSF leak, written and
oral informed consent was obtained. Consent was obtained by Dr.
MEMON. Time out form was completed.

Patient was positioned prone on the fluoroscopy table. Local
anesthesia was provided with 1% lidocaine without epinephrine after
prepped and draped in the usual sterile fashion. Puncture was
performed at L5-S1 using a 3 1/2 inch 22-gauge spinal needle via a
left interlaminar approach. Using a single pass through the dura,
the needle was placed within the thecal sac, with return of clear
CSF. 15 mL of Isovue [93] was injected into the thecal sac, with
normal opacification of the nerve roots and cauda equina consistent
with free flow within the subarachnoid space.

I personally performed the lumbar puncture and administered the
intrathecal contrast. I also personally supervised acquisition of
the myelogram images.
FINDINGS: LUMBAR MYELOGRAM FINDINGS:

There are 5 non rib-bearing lumbar type vertebrae. Vertebral
alignment is normal, and no abnormal motion is evident on flexion or
extension radiographs. There is evidence of severe spinal stenosis
at L2-3 with complete effacement contrast material on both prone and
upright images. Milder spinal stenosis is evident at L3-4 and L4-5.

CT LUMBAR MYELOGRAM FINDINGS:

Vertebral alignment is normal. No acute fracture or suspicious
osseous lesion is identified. Hemangiomas are noted in the L1, L2,
and L4 vertebral bodies. There is diffuse congenital narrowing of
the lumbar spinal canal due to short pedicles. There is partial
ankylosis across the SI joints. The conus medullaris terminates at
L1. A mixed injection is noted with some subdural and epidural
contrast. Subarachnoid opacification is excellent from L3-S1 with
fainter opacification above the L2-3 level where there is severe
spinal stenosis. The paraspinal soft tissues are unremarkable.

T11-12: Bridging anterior and right lateral vertebral osteophytes.
Interspinous and supraspinous ligament ossification. Right greater
than left facet spurring without significant stenosis.

T12-L1: A central to left paracentral osteophyte or calcified disc
protrusion results in borderline to mild spinal stenosis, unchanged.
Patent neural foramina.

L1-2: Right eccentric disc bulging, prominent dorsal epidural fat,
congenitally short pedicles, and moderate facet and ligamentum
flavum hypertrophy result in borderline to mild spinal stenosis and
mild bilateral neural foraminal stenosis, unchanged.

L2-3: Circumferential disc bulging, mildly prominent dorsal epidural
fat, congenitally short pedicles, and moderate facet and ligamentum
flavum hypertrophy result in severe spinal stenosis, right greater
than left lateral recess stenosis, and moderate right and moderate
to severe left neural foraminal stenosis, unchanged. Potential
bilateral L2 and L3 nerve root impingement.

L3-4: Disc bulging, congenitally short pedicles, and moderate facet
and ligamentum flavum hypertrophy result in mild spinal stenosis and
mild bilateral neural foraminal stenosis, unchanged.

L4-5: Disc bulging, congenitally short pedicles, and moderate facet
and ligamentum flavum hypertrophy result in mild spinal stenosis and
minimal to mild bilateral neural foraminal stenosis, unchanged.

L5-S1: Minimal disc bulging, endplate spurring, and mild facet
hypertrophy without stenosis, unchanged.
IMPRESSION: 1. Congenitally short pedicles with unchanged spondylosis and
posterior element hypertrophy.
2. Severe spinal stenosis and moderate to severe neural foraminal
stenosis at L2-3.
3. Mild spinal stenosis at L3-4 and L4-5.

## 2021-03-01 IMAGING — XA DG MYELOGRAPHY LUMBAR INJ LUMBOSACRAL
13 of 19 series · 13 of 19 positions shown · non-contrast
Comparison: Lumbar spine MRI [DATE]

CLINICAL DATA: Spinal stenosis, lumbar region with neurogenic
claudication. Low back pain radiating into the posterior aspects of
the right greater than left legs.
TECHNIQUE: Contiguous axial images were obtained through the Lumbar spine after
the intrathecal infusion of contrast. Coronal and sagittal
reconstructions were obtained of the axial image sets.

[Series 1: w lumbar spine lat · 0.15mm/px · 1 of 1 slices shown]
[im 1/1]
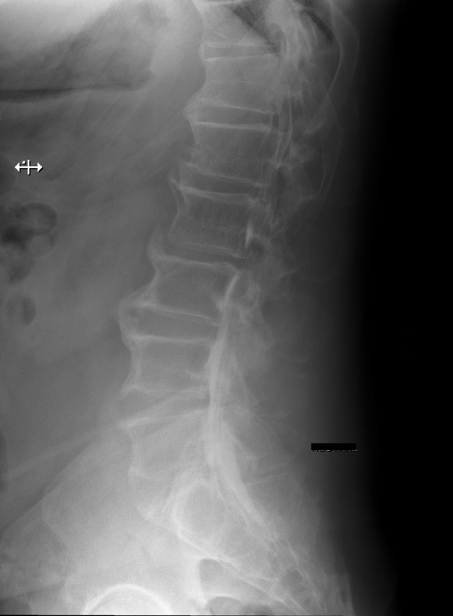

[Series 2: w lumbar spine flexion · 0.15mm/px · 1 of 1 slices shown]
[im 1/1]
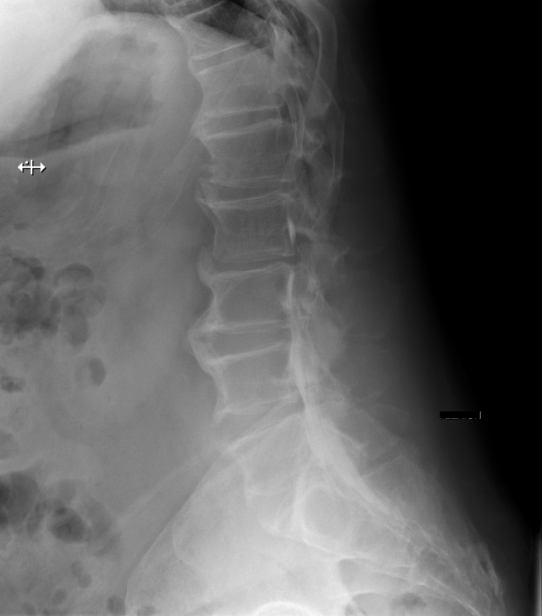

[Series 2: vasc adipose · 1 of 1 slices shown (1 of 10)]
[im 1/1]
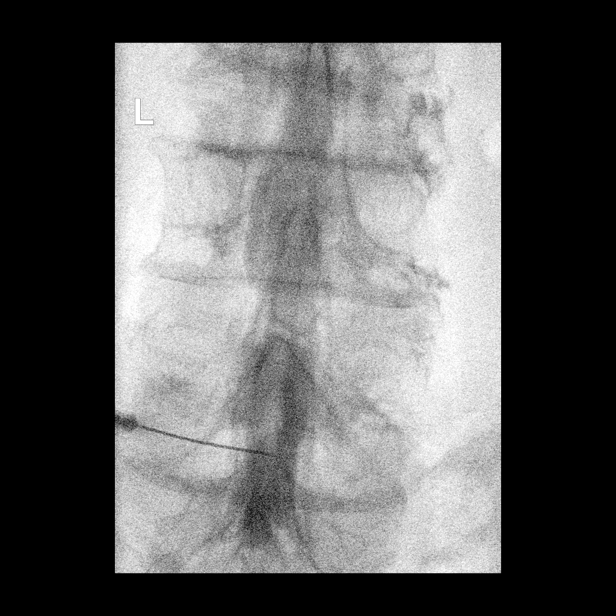

[Series 3: w lumbar spine extension · 0.15mm/px · 1 of 1 slices shown]
[im 1/1]
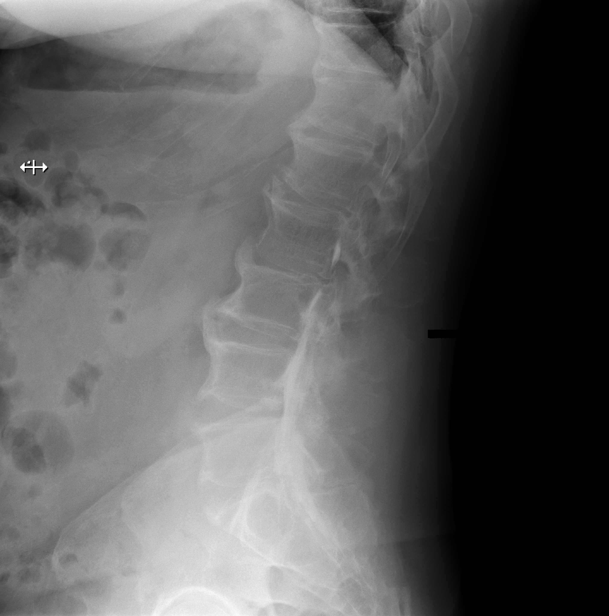

[Series 4: vasc adipose · 1 of 1 slices shown (2 of 10)]
[im 1/1]
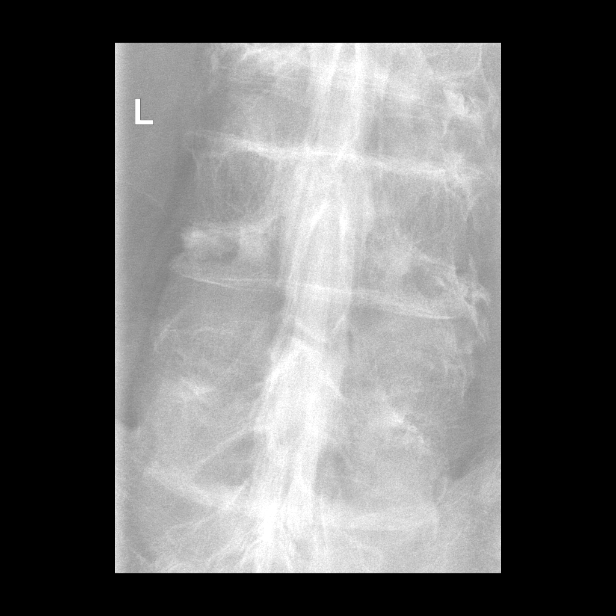

[Series 6: vasc adipose · 1 of 1 slices shown (3 of 10)]
[im 1/1]
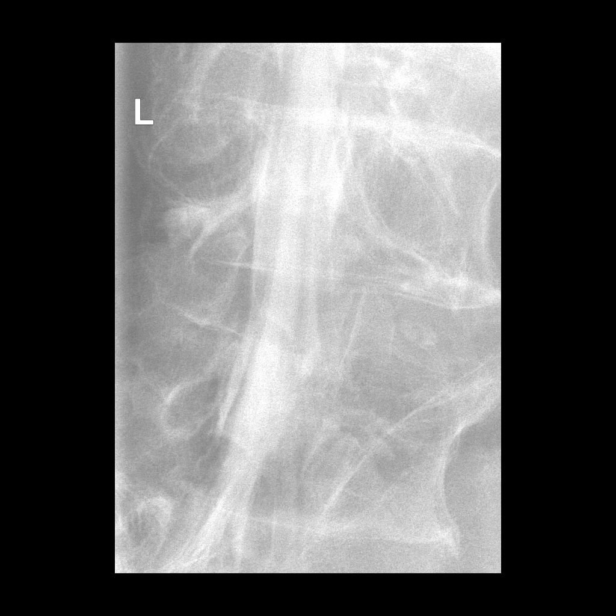

[Series 7: vasc adipose · 1 of 1 slices shown (4 of 10)]
[im 1/1]
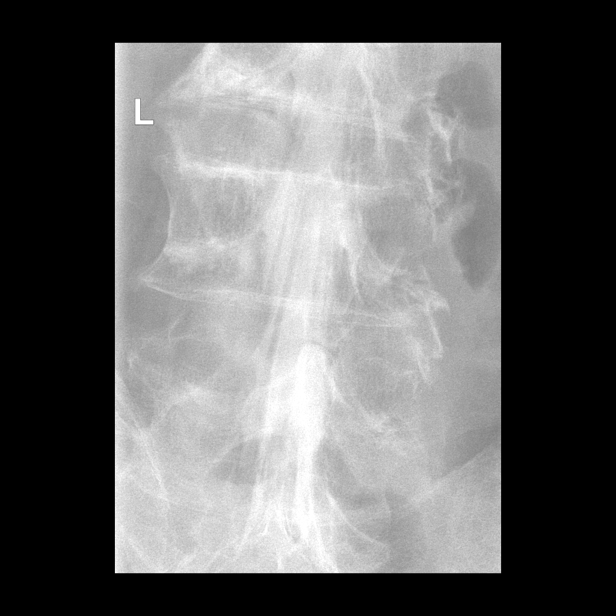

[Series 8: vasc adipose · 1 of 1 slices shown (5 of 10)]
[im 1/1]
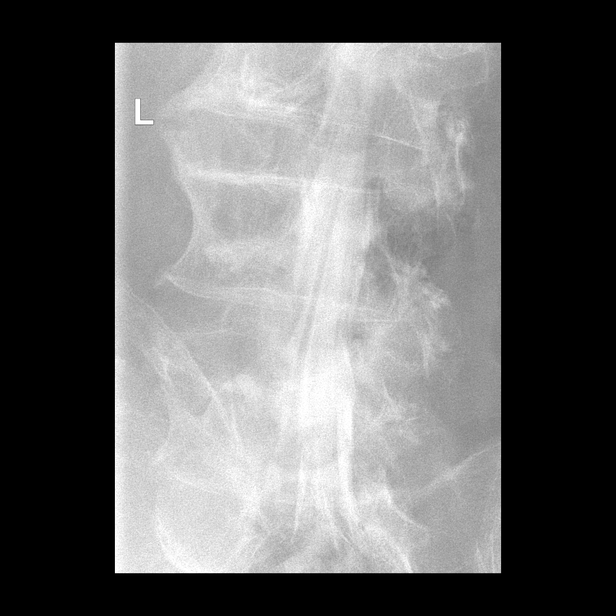

[Series 10: vasc adipose · 1 of 1 slices shown (6 of 10)]
[im 1/1]
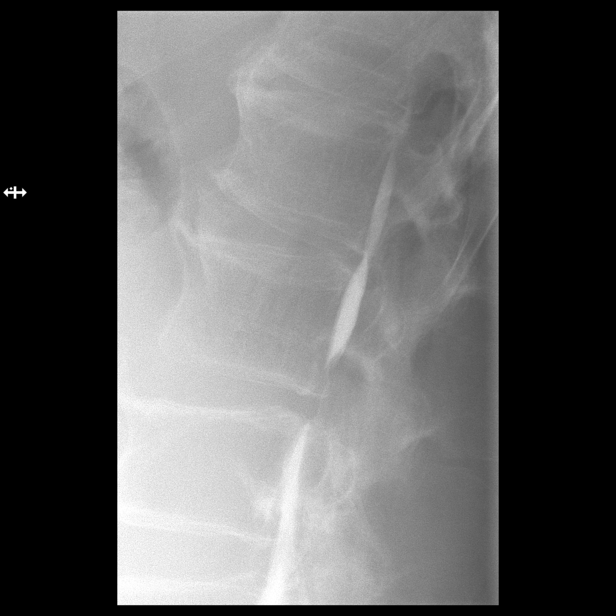

[Series 11: vasc adipose · 1 of 1 slices shown (7 of 10)]
[im 1/1]
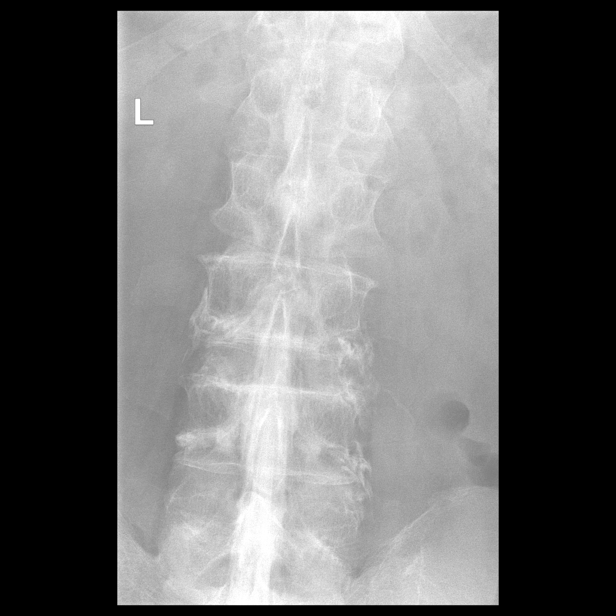

[Series 13: vasc adipose · 1 of 1 slices shown (8 of 10)]
[im 1/1]
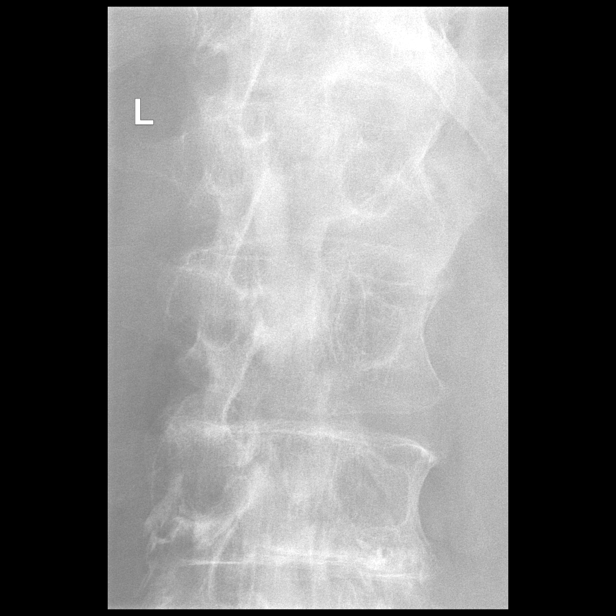

[Series 14: vasc adipose · 1 of 1 slices shown (9 of 10)]
[im 1/1]
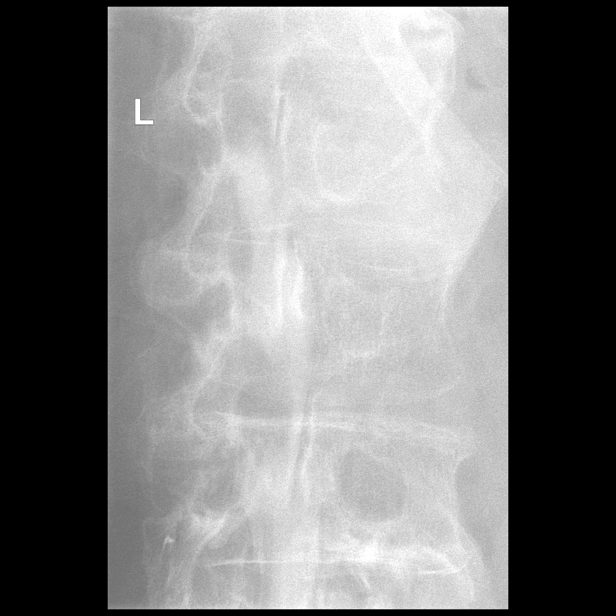

[Series 16: vasc adipose · 1 of 1 slices shown (10 of 10)]
[im 1/1]
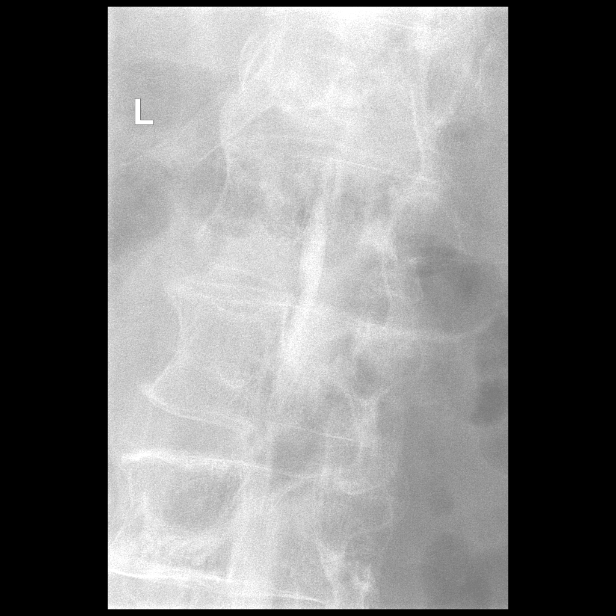

[13 of 19 positions shown; findings below may reference images not displayed]

EXAM:
LUMBAR MYELOGRAM

FLUOROSCOPY TIME:  Fluoroscopy Time: 1 minute 57 seconds

Radiation Exposure Index: 524.55 microGray*m^2

PROCEDURE:
After thorough discussion of risks and benefits of the procedure
including bleeding, infection, injury to nerves, blood vessels,
adjacent structures as well as headache and CSF leak, written and
oral informed consent was obtained. Consent was obtained by Dr.
MEMON. Time out form was completed.

Patient was positioned prone on the fluoroscopy table. Local
anesthesia was provided with 1% lidocaine without epinephrine after
prepped and draped in the usual sterile fashion. Puncture was
performed at L5-S1 using a 3 1/2 inch 22-gauge spinal needle via a
left interlaminar approach. Using a single pass through the dura,
the needle was placed within the thecal sac, with return of clear
CSF. 15 mL of Isovue [93] was injected into the thecal sac, with
normal opacification of the nerve roots and cauda equina consistent
with free flow within the subarachnoid space.

I personally performed the lumbar puncture and administered the
intrathecal contrast. I also personally supervised acquisition of
the myelogram images.
FINDINGS: LUMBAR MYELOGRAM FINDINGS:

There are 5 non rib-bearing lumbar type vertebrae. Vertebral
alignment is normal, and no abnormal motion is evident on flexion or
extension radiographs. There is evidence of severe spinal stenosis
at L2-3 with complete effacement contrast material on both prone and
upright images. Milder spinal stenosis is evident at L3-4 and L4-5.

CT LUMBAR MYELOGRAM FINDINGS:

Vertebral alignment is normal. No acute fracture or suspicious
osseous lesion is identified. Hemangiomas are noted in the L1, L2,
and L4 vertebral bodies. There is diffuse congenital narrowing of
the lumbar spinal canal due to short pedicles. There is partial
ankylosis across the SI joints. The conus medullaris terminates at
L1. A mixed injection is noted with some subdural and epidural
contrast. Subarachnoid opacification is excellent from L3-S1 with
fainter opacification above the L2-3 level where there is severe
spinal stenosis. The paraspinal soft tissues are unremarkable.

T11-12: Bridging anterior and right lateral vertebral osteophytes.
Interspinous and supraspinous ligament ossification. Right greater
than left facet spurring without significant stenosis.

T12-L1: A central to left paracentral osteophyte or calcified disc
protrusion results in borderline to mild spinal stenosis, unchanged.
Patent neural foramina.

L1-2: Right eccentric disc bulging, prominent dorsal epidural fat,
congenitally short pedicles, and moderate facet and ligamentum
flavum hypertrophy result in borderline to mild spinal stenosis and
mild bilateral neural foraminal stenosis, unchanged.

L2-3: Circumferential disc bulging, mildly prominent dorsal epidural
fat, congenitally short pedicles, and moderate facet and ligamentum
flavum hypertrophy result in severe spinal stenosis, right greater
than left lateral recess stenosis, and moderate right and moderate
to severe left neural foraminal stenosis, unchanged. Potential
bilateral L2 and L3 nerve root impingement.

L3-4: Disc bulging, congenitally short pedicles, and moderate facet
and ligamentum flavum hypertrophy result in mild spinal stenosis and
mild bilateral neural foraminal stenosis, unchanged.

L4-5: Disc bulging, congenitally short pedicles, and moderate facet
and ligamentum flavum hypertrophy result in mild spinal stenosis and
minimal to mild bilateral neural foraminal stenosis, unchanged.

L5-S1: Minimal disc bulging, endplate spurring, and mild facet
hypertrophy without stenosis, unchanged.
IMPRESSION: 1. Congenitally short pedicles with unchanged spondylosis and
posterior element hypertrophy.
2. Severe spinal stenosis and moderate to severe neural foraminal
stenosis at L2-3.
3. Mild spinal stenosis at L3-4 and L4-5.

## 2021-03-01 MED ORDER — ONDANSETRON HCL 4 MG/2ML IJ SOLN
4.0000 mg | Freq: Once | INTRAMUSCULAR | Status: AC | PRN
  Administered 2021-03-01: 4 mg via INTRAMUSCULAR

## 2021-03-01 MED ORDER — DIAZEPAM 5 MG PO TABS
5.0000 mg | ORAL_TABLET | Freq: Once | ORAL | Status: AC
  Administered 2021-03-01: 5 mg via ORAL

## 2021-03-01 MED ORDER — IOPAMIDOL (ISOVUE-M 200) INJECTION 41%
18.0000 mL | Freq: Once | INTRAMUSCULAR | Status: AC
  Administered 2021-03-01: 18 mL via INTRATHECAL

## 2021-03-01 MED ORDER — MEPERIDINE HCL 50 MG/ML IJ SOLN
50.0000 mg | Freq: Once | INTRAMUSCULAR | Status: AC | PRN
  Administered 2021-03-01: 50 mg via INTRAMUSCULAR

## 2021-03-01 NOTE — Discharge Instructions (Signed)

## 2021-03-01 NOTE — Discharge Instr - Other Orders (Addendum)
10:33 8/10 Pain noted post myelogram procedure, see MAR.  11:00 Relief reported.

## 2021-03-02 DIAGNOSIS — M7731 Calcaneal spur, right foot: Secondary | ICD-10-CM | POA: Diagnosis not present

## 2021-03-02 DIAGNOSIS — M7661 Achilles tendinitis, right leg: Secondary | ICD-10-CM | POA: Diagnosis not present

## 2021-03-02 DIAGNOSIS — N183 Chronic kidney disease, stage 3 unspecified: Secondary | ICD-10-CM | POA: Diagnosis not present

## 2021-03-02 DIAGNOSIS — M4316 Spondylolisthesis, lumbar region: Secondary | ICD-10-CM | POA: Diagnosis not present

## 2021-03-02 DIAGNOSIS — M069 Rheumatoid arthritis, unspecified: Secondary | ICD-10-CM | POA: Diagnosis not present

## 2021-03-02 DIAGNOSIS — M79671 Pain in right foot: Secondary | ICD-10-CM | POA: Diagnosis not present

## 2021-03-07 ENCOUNTER — Other Ambulatory Visit: Payer: Self-pay | Admitting: Neurosurgery

## 2021-03-08 DIAGNOSIS — Z79899 Other long term (current) drug therapy: Secondary | ICD-10-CM | POA: Diagnosis not present

## 2021-03-08 DIAGNOSIS — M1990 Unspecified osteoarthritis, unspecified site: Secondary | ICD-10-CM | POA: Diagnosis not present

## 2021-03-13 DIAGNOSIS — M48062 Spinal stenosis, lumbar region with neurogenic claudication: Secondary | ICD-10-CM | POA: Diagnosis not present

## 2021-03-13 DIAGNOSIS — M159 Polyosteoarthritis, unspecified: Secondary | ICD-10-CM | POA: Diagnosis not present

## 2021-03-13 DIAGNOSIS — N1831 Chronic kidney disease, stage 3a: Secondary | ICD-10-CM | POA: Diagnosis not present

## 2021-03-13 DIAGNOSIS — M1990 Unspecified osteoarthritis, unspecified site: Secondary | ICD-10-CM | POA: Diagnosis not present

## 2021-03-13 DIAGNOSIS — Z79899 Other long term (current) drug therapy: Secondary | ICD-10-CM | POA: Diagnosis not present

## 2021-03-17 NOTE — Pre-Procedure Instructions (Signed)
Surgical Instructions    Your procedure is scheduled on Wednesday, November 2nd.  Report to [facility] Main Entrance "A" at 9:00 A.M., then check in with the Admitting office.  Call this number if you have problems the morning of surgery:  [phone]   If you have any questions prior to your surgery date call [phone]: Open Monday-Friday 8am-4pm    Remember:  Do not eat or drink after midnight the night before your surgery    Take these medicines the morning of surgery with A SIP OF WATER  omeprazole (PRILOSEC)  fenofibrate (TRICOR)  As needed: acetaminophen (TYLENOL)  loratadine-pseudoephedrine (CLARITIN-D 12-HOUR)  oxymetazoline (AFRIN)  tiZANidine (ZANAFLEX)   As of today, STOP taking any Aspirin (unless otherwise instructed by your surgeon) Aleve, Naproxen, Ibuprofen, Motrin, Advil, Goody's, BC's, all herbal medications, fish oil, and all vitamins.                     Do NOT Smoke (Tobacco/Vaping) or drink Alcohol 24 hours prior to your procedure.  If you use a CPAP at night, you may bring all equipment for your overnight stay.   Contacts, glasses, piercing's, hearing aid's, dentures or partials may not be worn into surgery, please bring cases for these belongings.    For patients admitted to the hospital, discharge time will be determined by your treatment team.   Patients discharged the day of surgery will not be allowed to drive home, and someone needs to stay with them for 24 hours.  NO VISITORS WILL BE ALLOWED IN PRE-OP WHERE PATIENTS GET READY FOR SURGERY.  ONLY 1 SUPPORT PERSON MAY BE PRESENT IN THE WAITING ROOM WHILE YOU ARE IN SURGERY.  IF YOU ARE TO BE ADMITTED, ONCE YOU ARE IN YOUR ROOM YOU WILL BE ALLOWED TWO (2) VISITORS.  Minor children may have two parents present. Special consideration for safety and communication needs will be reviewed on a case by case basis.   Special instructions:   [facility]- Preparing For Surgery  Before surgery, you can  play an important role. Because skin is not sterile, your skin needs to be as free of germs as possible. You can reduce the number of germs on your skin by washing with CHG (chlorahexidine gluconate) Soap before surgery.  CHG is an antiseptic cleaner which kills germs and bonds with the skin to continue killing germs even after washing.    Oral Hygiene is also important to reduce your risk of infection.  Remember - BRUSH YOUR TEETH THE MORNING OF SURGERY WITH YOUR REGULAR TOOTHPASTE  Please do not use if you have an allergy to CHG or antibacterial soaps. If your skin becomes reddened/irritated stop using the CHG.  Do not shave (including legs and underarms) for at least 48 hours prior to first CHG shower. It is OK to shave your face.  Please follow these instructions carefully.   Shower the NIGHT BEFORE SURGERY and the MORNING OF SURGERY  If you chose to wash your hair, wash your hair first as usual with your normal shampoo.  After you shampoo, rinse your hair and body thoroughly to remove the shampoo.  Use CHG Soap as you would any other liquid soap. You can apply CHG directly to the skin and wash gently with a scrungie or a clean washcloth.   Apply the CHG Soap to your body ONLY FROM THE NECK DOWN.  Do not use on open wounds or open sores. Avoid contact with your eyes, ears, mouth and  genitals (private parts). Wash Face and genitals (private parts)  with your normal soap.   Wash thoroughly, paying special attention to the area where your surgery will be performed.  Thoroughly rinse your body with warm water from the neck down.  DO NOT shower/wash with your normal soap after using and rinsing off the CHG Soap.  Pat yourself dry with a CLEAN TOWEL.  Wear CLEAN PAJAMAS to bed the night before surgery  Place CLEAN SHEETS on your bed the night before your surgery  DO NOT SLEEP WITH PETS.   Day of Surgery: Shower with CHG soap. Do not wear jewelry Do not wear lotions, powders,  colognes, or deodorant. Do not shave 48 hours prior to surgery.  Men may shave face and neck. Do not bring valuables to the hospital. [facility] is not responsible for any belongings or valuables. Wear Clean/Comfortable clothing the morning of surgery Remember to brush your teeth WITH YOUR REGULAR TOOTHPASTE.   Please read over the following fact sheets that you were given.   3 days prior to your procedure or After your COVID test   You are not required to quarantine however you are required to wear a well-fitting mask when you are out and around people not in your household. If your mask becomes wet or soiled, replace with a new one.   Wash your hands often with soap and water for 20 seconds or clean your hands with an alcohol-based hand sanitizer that contains at least 60% alcohol.   Do not share personal items.   Notify your provider:  o if you are in close contact with someone who has COVID  o or if you develop a fever of 100.4 or greater, sneezing, cough, sore throat, shortness of breath or body aches.

## 2021-03-20 ENCOUNTER — Encounter (HOSPITAL_COMMUNITY)
Admission: RE | Admit: 2021-03-20 | Discharge: 2021-03-20 | Disposition: A | Discharge status: Home / Self Care | Payer: Medicare Other | Source: Ambulatory Visit | Attending: Neurosurgery | Admitting: Neurosurgery

## 2021-03-20 ENCOUNTER — Other Ambulatory Visit: Payer: Self-pay

## 2021-03-20 ENCOUNTER — Encounter (HOSPITAL_COMMUNITY): Payer: Self-pay

## 2021-03-20 VITALS — BP 138/87 | HR 78 | Temp 97.8°F | Resp 18 | Ht 69.0 in | Wt 241.1 lb

## 2021-03-20 DIAGNOSIS — I10 Essential (primary) hypertension: Secondary | ICD-10-CM | POA: Diagnosis not present

## 2021-03-20 DIAGNOSIS — Z20822 Contact with and (suspected) exposure to covid-19: Secondary | ICD-10-CM | POA: Insufficient documentation

## 2021-03-20 DIAGNOSIS — Z01818 Encounter for other preprocedural examination: Secondary | ICD-10-CM

## 2021-03-20 DIAGNOSIS — Z01812 Encounter for preprocedural laboratory examination: Secondary | ICD-10-CM | POA: Insufficient documentation

## 2021-03-20 DIAGNOSIS — R0602 Shortness of breath: Secondary | ICD-10-CM | POA: Diagnosis not present

## 2021-03-20 HISTORY — DX: Other complications of anesthesia, initial encounter: T88.59XA

## 2021-03-20 LAB — CBC
HCT: 44.1 % (ref 39.0–52.0)
Hemoglobin: 15 g/dL (ref 13.0–17.0)
MCH: 33.1 pg (ref 26.0–34.0)
MCHC: 34 g/dL (ref 30.0–36.0)
MCV: 97.4 fL (ref 80.0–100.0)
Platelets: 226 10*3/uL (ref 150–400)
RBC: 4.53 MIL/uL (ref 4.22–5.81)
RDW: 13.3 % (ref 11.5–15.5)
WBC: 6.8 10*3/uL (ref 4.0–10.5)
nRBC: 0 % (ref 0.0–0.2)

## 2021-03-20 LAB — TYPE AND SCREEN
ABO/RH(D): O POS
Antibody Screen: NEGATIVE

## 2021-03-20 LAB — BASIC METABOLIC PANEL
Anion gap: 8 (ref 5–15)
BUN: 18 mg/dL (ref 8–23)
CO2: 25 mmol/L (ref 22–32)
Calcium: 9.4 mg/dL (ref 8.9–10.3)
Chloride: 105 mmol/L (ref 98–111)
Creatinine, Ser: 1.21 mg/dL (ref 0.61–1.24)
GFR, Estimated: >60 mL/min (ref >60)
Glucose, Bld: 95 mg/dL (ref 70–99)
Potassium: 4.5 mmol/L (ref 3.5–5.1)
Sodium: 138 mmol/L (ref 135–145)

## 2021-03-20 LAB — SURGICAL PCR SCREEN
MRSA, PCR: NEGATIVE
Staphylococcus aureus: POSITIVE — AB

## 2021-03-20 LAB — SARS CORONAVIRUS 2 (TAT 6-24 HRS): SARS Coronavirus 2: NEGATIVE

## 2021-03-20 NOTE — Progress Notes (Signed)
PCP - Dr. Charlynne Cousins Cardiologist - Dr. Serafina Royals  PPM/ICD - n/a  Chest x-ray - n/a EKG - 01/09/21-requested tracing from [place] - 02/09/21-requested from [facility] ECHO - 02/09/21-Resulted in [facility] Cardiac Cath - denies  Sleep Study - denies. Stop bang 5, results sent to PCP CPAP - denies  Blood Thinner Instructions: n/a Aspirin Instructions:n/a Methotrexate-LD 10/23; ordered to hold per surgeon.   NPO after MD.  COVID TEST- 03/20/21; done in PAT  Anesthesia review: Yes, cardiac records pending. Clearance received from Dr. Nehemiah Massed on 02/27/21, note found in [facility].   Patient denies shortness of breath, fever, cough and chest pain at PAT appointment   All instructions explained to the patient, with a verbal understanding of the material. Patient agrees to go over the instructions while at home for a better understanding. Patient also instructed to self quarantine after being tested for COVID-19. The opportunity to ask questions was provided.

## 2021-03-20 NOTE — Progress Notes (Signed)
   03/20/21 1124  OBSTRUCTIVE SLEEP APNEA  Have you ever been diagnosed with sleep apnea through a sleep study? No  Do you snore loudly (loud enough to be heard through closed doors)?  0  Do you often feel tired, fatigued, or sleepy during the daytime (such as falling asleep during driving or talking to someone)? 0  Has anyone observed you stop breathing during your sleep? 0  Do you have, or are you being treated for high blood pressure? 1  BMI more than 35 kg/m2? 1  Age > 50 (1-yes) 1  Neck circumference greater than:Male 16 inches or larger, Male 17inches or larger? 1  Male Gender (Yes=1) 1  Obstructive Sleep Apnea Score 5  Score 5 or greater  Results sent to PCP

## 2021-03-21 ENCOUNTER — Other Ambulatory Visit: Payer: Self-pay | Admitting: Neurosurgery

## 2021-03-21 NOTE — Progress Notes (Signed)
Anesthesia Chart Review:  Follows with cardiologist Dr. Nehemiah Massed at [place] [facility] for history of hypertension.  He is noted to have chronic stable shortness of breath with moderate exertion.  Exercise stress test September 2022 was noted to be normal with no evidence of ischemia or arrhythmia.  Echo September 2022 showed normal biventricular function, EF >55%, no significant valvular abnormalities.  Cleared for surgery per office visit note 02/27/2021, "-Proceed to surgery and/or invasive procedure without restriction to pre or post operative and/or procedural care. The patient is at lowest risk possible for cardiovascular complications with surgical intervention and/or invasive procedure. Currently has no evidence active and/or significant angina and/or congestive heart failure."  Spine imaging shows features consistent with DISH.  Follows with rheumatology at [facility] for history of inflammatory arthritis, maintained on methotrexate.  No history of OSA, however preop STOP-BANG score of 5 indicates moderate risk.  Preop labs reviewed, WNL.  EKG 01/09/2021 (Care Everywhere): NSR.  Rate 67.  Nonspecific T wave abnormalities inferior leads.  Exercise stress test 02/09/2021 (see copy in chart): Conclusions: Normal treadmill ECG without evidence of ischemia or arrhythmia.  TTE 02/09/2021 (Care Everywhere): INTERPRETATION  NORMAL LEFT VENTRICULAR SYSTOLIC FUNCTION  NORMAL RIGHT VENTRICULAR SYSTOLIC FUNCTION  NO VALVULAR STENOSIS  TRIVIAL MR, TR  EF >55%    Wynonia Musty [facility] Short Stay Center/Anesthesiology Phone [phone] 03/21/2021 9:55 AM

## 2021-03-21 NOTE — Anesthesia Preprocedure Evaluation (Addendum)
Anesthesia Evaluation  Patient identified by MRN, date of birth, ID band Patient awake    Reviewed: Allergy & Precautions, NPO status , Patient's Chart, lab work & pertinent test results  Airway Mallampati: III  TM Distance: >3 FB Neck ROM: Limited    Dental  (+) Dental Advisory Given   Pulmonary former smoker,    breath sounds clear to auscultation       Cardiovascular hypertension, Pt. on medications + Peripheral Vascular Disease   Rhythm:Regular Rate:Normal     Neuro/Psych    GI/Hepatic Neg liver ROS, GERD  ,  Endo/Other  negative endocrine ROS  Renal/GU Renal disease     Musculoskeletal  (+) Arthritis ,   Abdominal   Peds  Hematology negative hematology ROS (+)   Anesthesia Other Findings   Reproductive/Obstetrics                            Anesthesia Physical Anesthesia Plan  ASA: 2  Anesthesia Plan: General   Post-op Pain Management:    Induction: Intravenous  PONV Risk Score and Plan: 2 and Dexamethasone, Ondansetron and Treatment may vary due to age or medical condition  Airway Management Planned: Oral ETT  Additional Equipment: None  Intra-op Plan:   Post-operative Plan: Extubation in OR  Informed Consent: I have reviewed the patients History and Physical, chart, labs and discussed the procedure including the risks, benefits and alternatives for the proposed anesthesia with the patient or authorized representative who has indicated his/her understanding and acceptance.     Dental advisory given  Plan Discussed with: CRNA  Anesthesia Plan Comments: (PAT note by Karoline Caldwell, PA-C:   Follows with cardiologist Dr. Nehemiah Massed at [place] [facility] for history of hypertension.  He is noted to have chronic stable shortness of breath with moderate exertion.  Exercise stress test September 2022 was noted to be normal with no evidence of ischemia or arrhythmia.  Echo  September 2022 showed normal biventricular function, EF >55%, no significant valvular abnormalities.  Cleared for surgery per office visit note 02/27/2021, "-Proceed to surgery and/or invasive procedure without restriction to pre or post operative and/or procedural care. The patient is at lowest risk possible for cardiovascular complications with surgical intervention and/or invasive procedure. Currently has no evidence active and/or significant angina and/or congestive heart failure."  Spine imaging shows features consistent with DISH.  Follows with rheumatology at [facility] clinic for history of inflammatory arthritis, maintained on methotrexate.  No history of OSA, however preop STOP-BANG score of 5 indicates moderate risk.  Preop labs reviewed, WNL.  EKG 01/09/2021 (Care Everywhere): NSR.  Rate 67.  Nonspecific T wave abnormalities inferior leads.  Exercise stress test 02/09/2021 (see copy in chart): Conclusions: Normal treadmill ECG without evidence of ischemia or arrhythmia.  TTE 02/09/2021 (Care Everywhere): INTERPRETATION  NORMAL LEFT VENTRICULAR SYSTOLIC FUNCTION  NORMAL RIGHT VENTRICULAR SYSTOLIC FUNCTION  NO VALVULAR STENOSIS  TRIVIAL MR, TR  EF >55%   )       Anesthesia Quick Evaluation

## 2021-03-22 ENCOUNTER — Encounter (HOSPITAL_COMMUNITY)
Admission: RE | Disposition: A | Discharge status: Home / Self Care | Payer: Self-pay | Source: Home / Self Care | Attending: Neurosurgery

## 2021-03-22 ENCOUNTER — Other Ambulatory Visit: Payer: Self-pay

## 2021-03-22 ENCOUNTER — Ambulatory Visit (HOSPITAL_COMMUNITY)
Admission: RE | Admit: 2021-03-22 | Discharge: 2021-03-24 | Disposition: A | Discharge status: Home / Self Care | Payer: Medicare Other | Attending: Neurosurgery | Admitting: Neurosurgery

## 2021-03-22 ENCOUNTER — Ambulatory Visit (HOSPITAL_COMMUNITY): Payer: Medicare Other | Admitting: Critical Care Medicine

## 2021-03-22 ENCOUNTER — Encounter (HOSPITAL_COMMUNITY): Payer: Self-pay | Admitting: Neurosurgery

## 2021-03-22 ENCOUNTER — Ambulatory Visit (HOSPITAL_COMMUNITY): Payer: Medicare Other

## 2021-03-22 ENCOUNTER — Ambulatory Visit (HOSPITAL_COMMUNITY): Payer: Medicare Other | Admitting: Physician Assistant

## 2021-03-22 DIAGNOSIS — M4316 Spondylolisthesis, lumbar region: Secondary | ICD-10-CM | POA: Diagnosis not present

## 2021-03-22 DIAGNOSIS — Z9889 Other specified postprocedural states: Secondary | ICD-10-CM | POA: Diagnosis not present

## 2021-03-22 DIAGNOSIS — Z87891 Personal history of nicotine dependence: Secondary | ICD-10-CM | POA: Diagnosis not present

## 2021-03-22 DIAGNOSIS — M48062 Spinal stenosis, lumbar region with neurogenic claudication: Secondary | ICD-10-CM | POA: Diagnosis not present

## 2021-03-22 DIAGNOSIS — M4726 Other spondylosis with radiculopathy, lumbar region: Secondary | ICD-10-CM | POA: Diagnosis not present

## 2021-03-22 DIAGNOSIS — M5416 Radiculopathy, lumbar region: Secondary | ICD-10-CM | POA: Diagnosis not present

## 2021-03-22 DIAGNOSIS — Z79899 Other long term (current) drug therapy: Secondary | ICD-10-CM | POA: Insufficient documentation

## 2021-03-22 DIAGNOSIS — Z888 Allergy status to other drugs, medicaments and biological substances status: Secondary | ICD-10-CM | POA: Insufficient documentation

## 2021-03-22 DIAGNOSIS — Z419 Encounter for procedure for purposes other than remedying health state, unspecified: Secondary | ICD-10-CM

## 2021-03-22 DIAGNOSIS — I10 Essential (primary) hypertension: Secondary | ICD-10-CM | POA: Diagnosis not present

## 2021-03-22 DIAGNOSIS — M545 Low back pain, unspecified: Secondary | ICD-10-CM | POA: Diagnosis not present

## 2021-03-22 DIAGNOSIS — Z885 Allergy status to narcotic agent status: Secondary | ICD-10-CM | POA: Insufficient documentation

## 2021-03-22 DIAGNOSIS — K219 Gastro-esophageal reflux disease without esophagitis: Secondary | ICD-10-CM | POA: Diagnosis not present

## 2021-03-22 DIAGNOSIS — M069 Rheumatoid arthritis, unspecified: Secondary | ICD-10-CM | POA: Diagnosis not present

## 2021-03-22 DIAGNOSIS — Z87892 Personal history of anaphylaxis: Secondary | ICD-10-CM | POA: Diagnosis not present

## 2021-03-22 DIAGNOSIS — E785 Hyperlipidemia, unspecified: Secondary | ICD-10-CM | POA: Diagnosis not present

## 2021-03-22 LAB — ABO/RH: ABO/RH(D): O POS

## 2021-03-22 IMAGING — CR DG LUMBAR SPINE 2-3V
2 series · 2 of 2 positions shown · non-contrast
Comparison: CT lumbar spine done on [DATE]

CLINICAL DATA: Back pain, assistance for surgical procedure

EXAM:
LUMBAR SPINE - 2-3 VIEW

[lateral (1 of 2)]
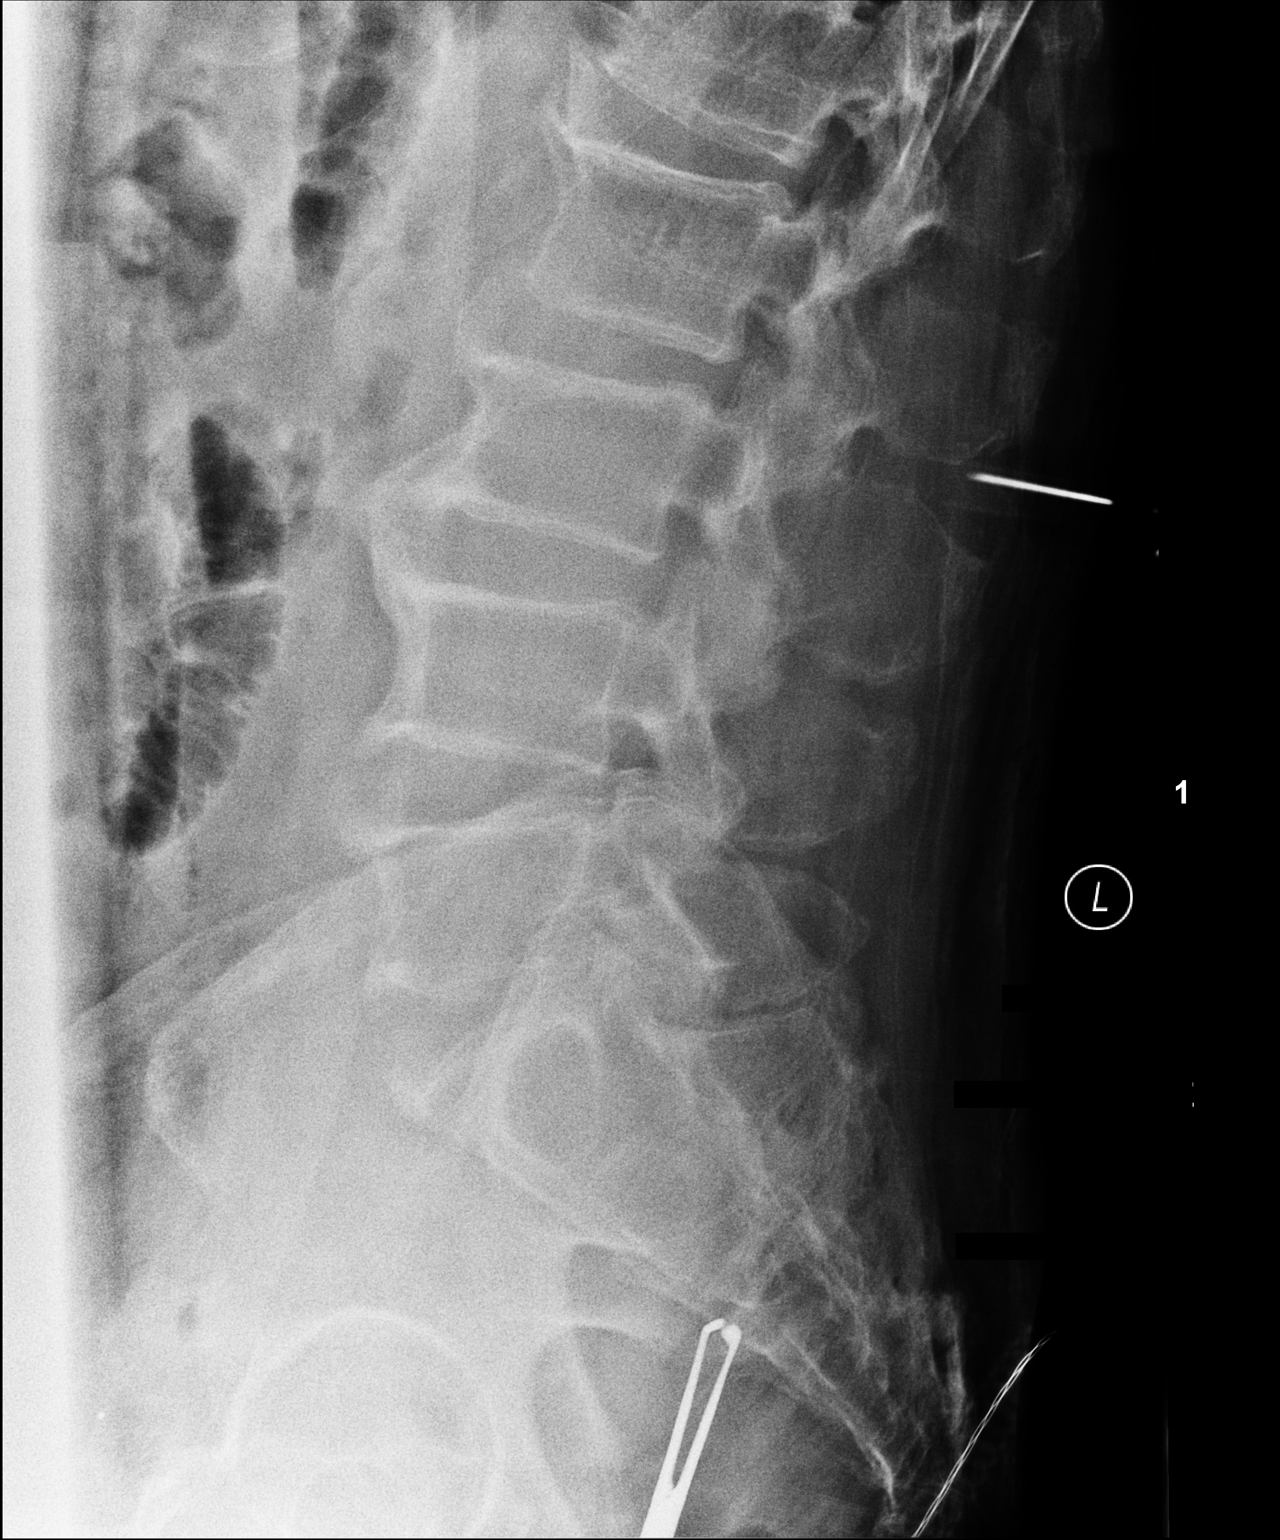

[lateral (2 of 2)]
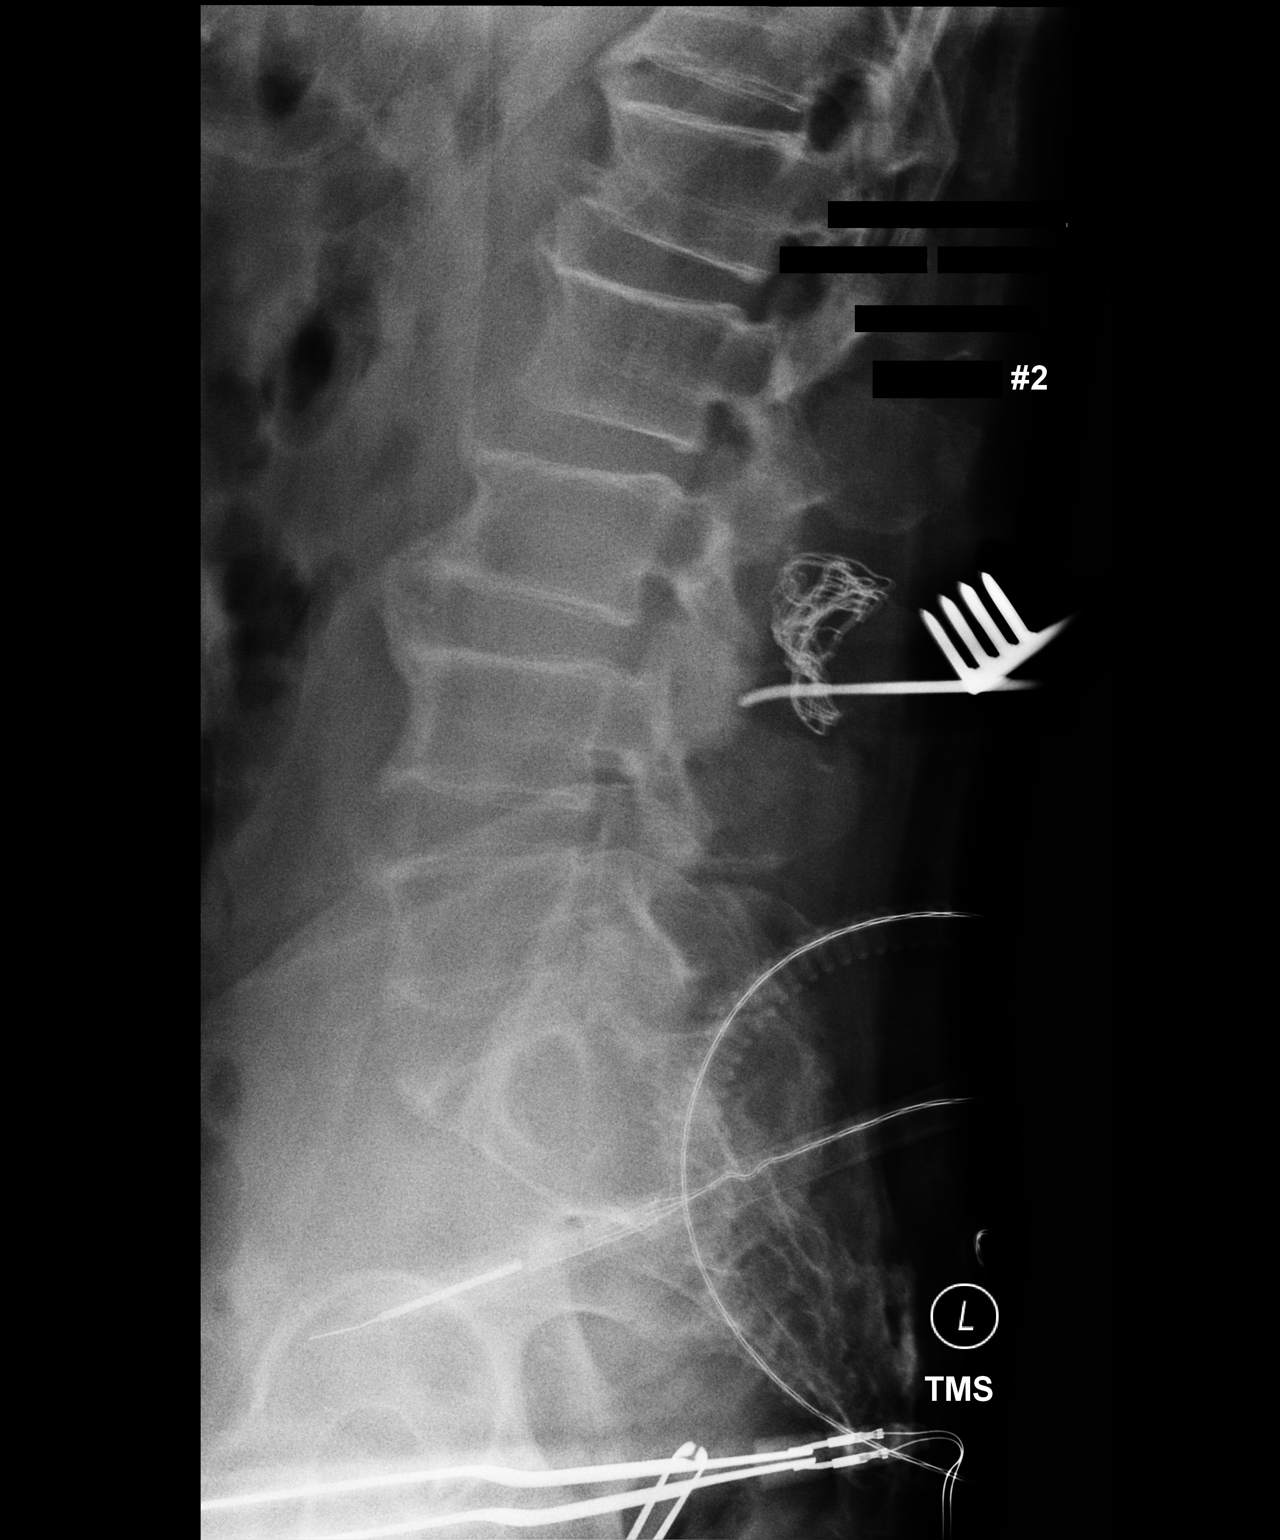

[2 of 2 positions shown; findings below may reference images not displayed]

FINDINGS: Two cross-table lateral views of lumbar spine are submitted for
review. In the first image, a localizing probe is noted at L2-L3
level between the spinous processes. In the second image, there is
evidence of removal of spinous process of L3 vertebra.
IMPRESSION: Portable radiographs were obtained for localization for lumbar spine
surgery.

## 2021-03-22 IMAGING — RF DG LUMBAR SPINE 2-3V
1 series · 2 of 2 positions shown · non-contrast
Comparison: Earlier same day lumbar spine radiograph at [T4] hours

CLINICAL DATA: Intraoperative localization.

EXAM:
LUMBAR SPINE - 2-3 VIEW

[Series 1: run · 2 of 2 slices shown]
[im 1/2]
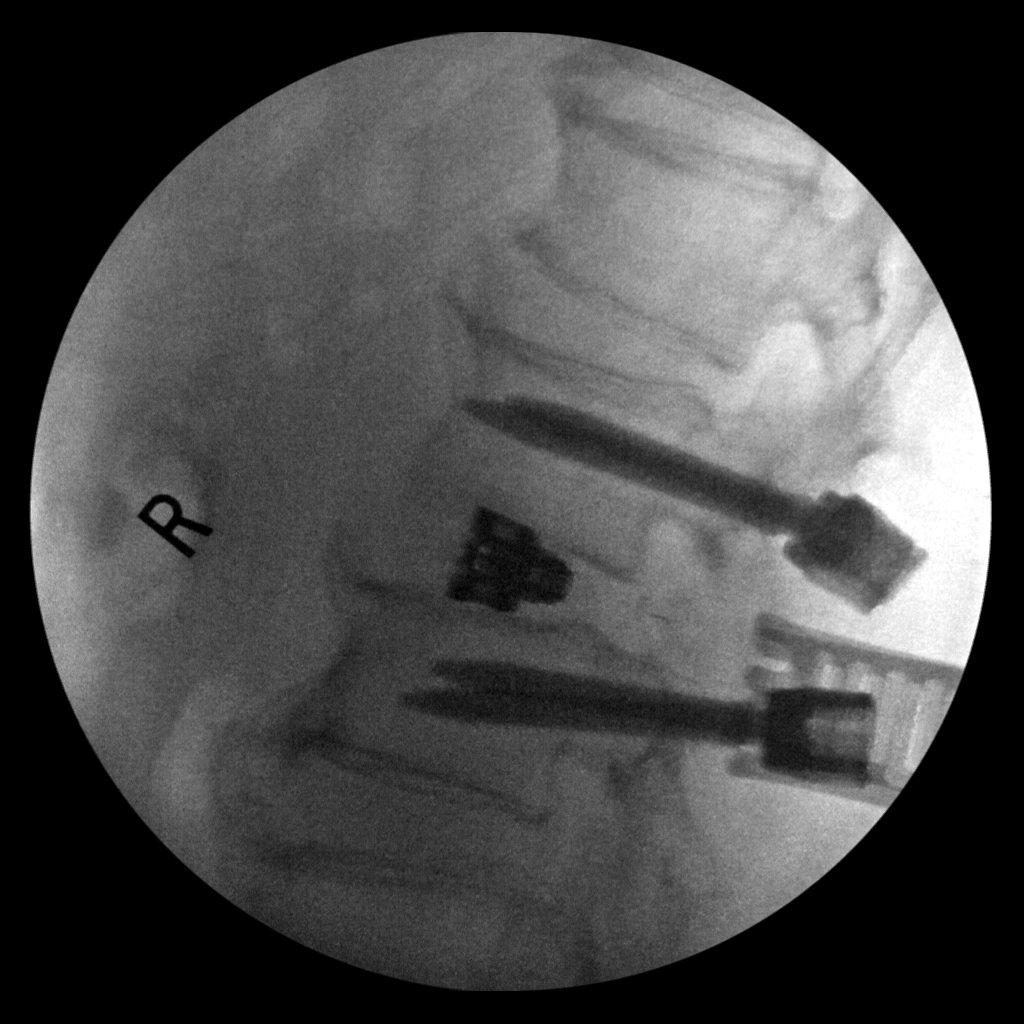
[im 2/2]
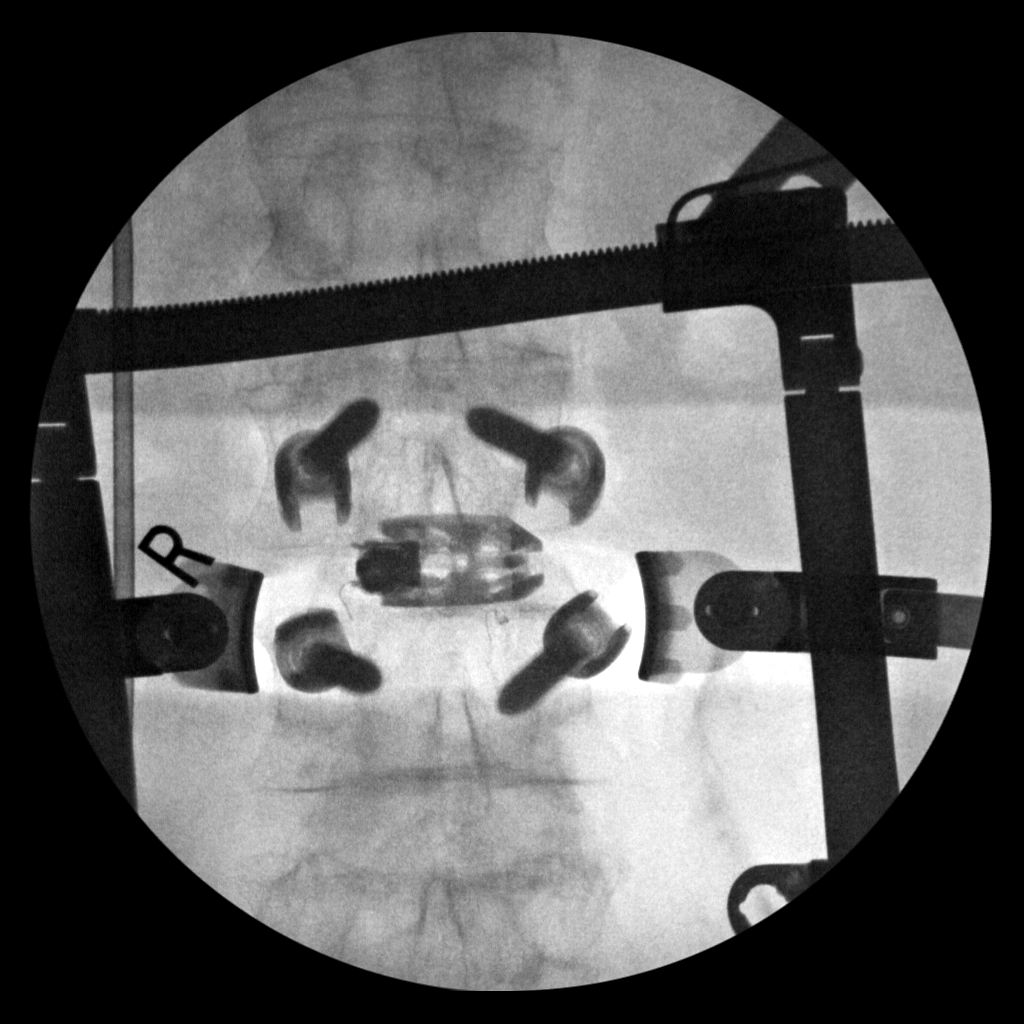

[2 of 2 positions shown; findings below may reference images not displayed]

FINDINGS: Intraoperative fluoroscopy for localization purposes.

1 low resolution intraoperative spot view of the lumbar spine was
obtained. Interval placement of bilateral pedicle screws and
intervertebral spacer are noted.

Total fluoroscopy time: 14 seconds

Total radiation dose: 12.68 mGy
IMPRESSION: Intraoperative fluoroscopy for localization purposes. Please see the
performing provider's procedure note for further details.

## 2021-03-22 SURGERY — POSTERIOR LUMBAR FUSION 1 LEVEL
Anesthesia: General | Site: Spine Lumbar

## 2021-03-22 MED ORDER — CHLORHEXIDINE GLUCONATE CLOTH 2 % EX PADS: 6.0000 | MEDICATED_PAD | Freq: Once | CUTANEOUS | Status: DC

## 2021-03-22 MED ORDER — OXYCODONE HCL 5 MG PO TABS
5.0000 mg | ORAL_TABLET | ORAL | Status: DC | PRN
  Administered 2021-03-22: 5 mg via ORAL
  Filled 2021-03-22: qty 1

## 2021-03-22 MED ORDER — SODIUM CHLORIDE 0.9% FLUSH: 3.0000 mL | INTRAVENOUS | Status: DC | PRN

## 2021-03-22 MED ORDER — ONDANSETRON HCL 4 MG/2ML IJ SOLN
INTRAMUSCULAR | Status: DC | PRN
  Administered 2021-03-22: 4 mg via INTRAVENOUS

## 2021-03-22 MED ORDER — CEFAZOLIN SODIUM-DEXTROSE 2-4 GM/100ML-% IV SOLN
2.0000 g | INTRAVENOUS | Status: AC
  Administered 2021-03-22: 2 g via INTRAVENOUS
  Filled 2021-03-22: qty 100

## 2021-03-22 MED ORDER — THROMBIN 5000 UNITS EX SOLR
CUTANEOUS | Status: AC
  Filled 2021-03-22: qty 5000

## 2021-03-22 MED ORDER — IRBESARTAN 150 MG PO TABS
150.0000 mg | ORAL_TABLET | Freq: Every day | ORAL | Status: DC
  Administered 2021-03-23 – 2021-03-24 (×2): 150 mg via ORAL
  Filled 2021-03-22 (×2): qty 1

## 2021-03-22 MED ORDER — TIZANIDINE HCL 4 MG PO TABS
4.0000 mg | ORAL_TABLET | Freq: Every evening | ORAL | Status: DC | PRN
  Administered 2021-03-23: 4 mg via ORAL
  Filled 2021-03-22: qty 1

## 2021-03-22 MED ORDER — ONDANSETRON HCL 4 MG PO TABS: 4.0000 mg | ORAL_TABLET | Freq: Four times a day (QID) | ORAL | Status: DC | PRN

## 2021-03-22 MED ORDER — PHENYLEPHRINE 40 MCG/ML (10ML) SYRINGE FOR IV PUSH (FOR BLOOD PRESSURE SUPPORT)
PREFILLED_SYRINGE | INTRAVENOUS | Status: DC | PRN
  Administered 2021-03-22: 80 ug via INTRAVENOUS

## 2021-03-22 MED ORDER — CHLORHEXIDINE GLUCONATE 0.12 % MT SOLN
15.0000 mL | Freq: Once | OROMUCOSAL | Status: AC
  Administered 2021-03-22: 15 mL via OROMUCOSAL
  Filled 2021-03-22: qty 15

## 2021-03-22 MED ORDER — SCOPOLAMINE 1 MG/3DAYS TD PT72
1.0000 | MEDICATED_PATCH | TRANSDERMAL | Status: DC
  Administered 2021-03-22: 1.5 mg via TRANSDERMAL

## 2021-03-22 MED ORDER — PHENOL 1.4 % MT LIQD: 1.0000 | OROMUCOSAL | Status: DC | PRN

## 2021-03-22 MED ORDER — PROPOFOL 10 MG/ML IV BOLUS
INTRAVENOUS | Status: AC
  Filled 2021-03-22: qty 20

## 2021-03-22 MED ORDER — BACITRACIN ZINC 500 UNIT/GM EX OINT
TOPICAL_OINTMENT | CUTANEOUS | Status: AC
  Filled 2021-03-22: qty 28.35

## 2021-03-22 MED ORDER — ROCURONIUM BROMIDE 10 MG/ML (PF) SYRINGE
PREFILLED_SYRINGE | INTRAVENOUS | Status: DC | PRN
  Administered 2021-03-22: 60 mg via INTRAVENOUS
  Administered 2021-03-22 (×2): 20 mg via INTRAVENOUS

## 2021-03-22 MED ORDER — PANTOPRAZOLE SODIUM 40 MG PO TBEC
40.0000 mg | DELAYED_RELEASE_TABLET | Freq: Every day | ORAL | Status: DC
  Administered 2021-03-22 – 2021-03-24 (×3): 40 mg via ORAL
  Filled 2021-03-22: qty 2
  Filled 2021-03-22: qty 1

## 2021-03-22 MED ORDER — ONDANSETRON HCL 4 MG/2ML IJ SOLN
INTRAMUSCULAR | Status: AC
  Filled 2021-03-22: qty 2

## 2021-03-22 MED ORDER — POLYETHYL GLYCOL-PROPYL GLYCOL 0.4-0.3 % OP GEL
Freq: Every day | OPHTHALMIC | Status: DC | PRN
  Filled 2021-03-22: qty 10

## 2021-03-22 MED ORDER — PROPOFOL 500 MG/50ML IV EMUL
INTRAVENOUS | Status: DC | PRN
  Administered 2021-03-22: 20 ug/kg/min via INTRAVENOUS

## 2021-03-22 MED ORDER — BACITRACIN ZINC 500 UNIT/GM EX OINT
TOPICAL_OINTMENT | CUTANEOUS | Status: DC | PRN
  Administered 2021-03-22: 1 via TOPICAL

## 2021-03-22 MED ORDER — FENTANYL CITRATE (PF) 100 MCG/2ML IJ SOLN
25.0000 ug | INTRAMUSCULAR | Status: DC | PRN
  Administered 2021-03-22: 25 ug via INTRAVENOUS
  Administered 2021-03-22: 50 ug via INTRAVENOUS
  Administered 2021-03-22: 25 ug via INTRAVENOUS

## 2021-03-22 MED ORDER — MIDAZOLAM HCL 2 MG/2ML IJ SOLN
INTRAMUSCULAR | Status: AC
  Filled 2021-03-22: qty 2

## 2021-03-22 MED ORDER — BISACODYL 10 MG RE SUPP: 10.0000 mg | Freq: Every day | RECTAL | Status: DC | PRN

## 2021-03-22 MED ORDER — ACETAMINOPHEN 500 MG PO TABS
1000.0000 mg | ORAL_TABLET | Freq: Four times a day (QID) | ORAL | Status: AC
  Administered 2021-03-22 – 2021-03-23 (×4): 1000 mg via ORAL
  Filled 2021-03-22 (×4): qty 2

## 2021-03-22 MED ORDER — ACETAMINOPHEN 500 MG PO TABS
1000.0000 mg | ORAL_TABLET | Freq: Once | ORAL | Status: AC
  Administered 2021-03-22: 1000 mg via ORAL
  Filled 2021-03-22: qty 2

## 2021-03-22 MED ORDER — AMISULPRIDE (ANTIEMETIC) 5 MG/2ML IV SOLN: 10.0000 mg | Freq: Once | INTRAVENOUS | Status: DC | PRN

## 2021-03-22 MED ORDER — FENTANYL CITRATE (PF) 100 MCG/2ML IJ SOLN
INTRAMUSCULAR | Status: AC
  Filled 2021-03-22: qty 2

## 2021-03-22 MED ORDER — FENTANYL CITRATE (PF) 250 MCG/5ML IJ SOLN
INTRAMUSCULAR | Status: AC
  Filled 2021-03-22: qty 5

## 2021-03-22 MED ORDER — ONDANSETRON HCL 4 MG/2ML IJ SOLN: 4.0000 mg | Freq: Four times a day (QID) | INTRAMUSCULAR | Status: DC | PRN

## 2021-03-22 MED ORDER — CEFAZOLIN SODIUM-DEXTROSE 2-4 GM/100ML-% IV SOLN
2.0000 g | Freq: Three times a day (TID) | INTRAVENOUS | Status: AC
  Administered 2021-03-22 – 2021-03-23 (×2): 2 g via INTRAVENOUS
  Filled 2021-03-22 (×2): qty 100

## 2021-03-22 MED ORDER — 0.9 % SODIUM CHLORIDE (POUR BTL) OPTIME
TOPICAL | Status: DC | PRN
  Administered 2021-03-22: 1000 mL

## 2021-03-22 MED ORDER — PHENYLEPHRINE HCL-NACL 20-0.9 MG/250ML-% IV SOLN
INTRAVENOUS | Status: DC | PRN
  Administered 2021-03-22: 25 ug/min via INTRAVENOUS

## 2021-03-22 MED ORDER — MORPHINE SULFATE (PF) 4 MG/ML IV SOLN: 4.0000 mg | INTRAVENOUS | Status: DC | PRN

## 2021-03-22 MED ORDER — ACETAMINOPHEN 650 MG RE SUPP: 650.0000 mg | RECTAL | Status: DC | PRN

## 2021-03-22 MED ORDER — LACTATED RINGERS IV SOLN: INTRAVENOUS | Status: DC

## 2021-03-22 MED ORDER — DOCUSATE SODIUM 100 MG PO CAPS
100.0000 mg | ORAL_CAPSULE | Freq: Two times a day (BID) | ORAL | Status: DC
  Administered 2021-03-22 – 2021-03-24 (×4): 100 mg via ORAL
  Filled 2021-03-22 (×4): qty 1

## 2021-03-22 MED ORDER — PROPOFOL 10 MG/ML IV BOLUS
INTRAVENOUS | Status: DC | PRN
  Administered 2021-03-22: 150 mg via INTRAVENOUS

## 2021-03-22 MED ORDER — OXYCODONE HCL 5 MG PO TABS
10.0000 mg | ORAL_TABLET | ORAL | Status: DC | PRN
  Administered 2021-03-22 – 2021-03-24 (×11): 10 mg via ORAL
  Filled 2021-03-22 (×11): qty 2

## 2021-03-22 MED ORDER — SUGAMMADEX SODIUM 200 MG/2ML IV SOLN
INTRAVENOUS | Status: DC | PRN
  Administered 2021-03-22: 200 mg via INTRAVENOUS

## 2021-03-22 MED ORDER — ACETAMINOPHEN 325 MG PO TABS
650.0000 mg | ORAL_TABLET | ORAL | Status: DC | PRN
  Administered 2021-03-23 – 2021-03-24 (×3): 650 mg via ORAL
  Filled 2021-03-22 (×3): qty 2

## 2021-03-22 MED ORDER — LORATADINE 10 MG PO TABS: 10.0000 mg | ORAL_TABLET | Freq: Every day | ORAL | Status: DC | PRN

## 2021-03-22 MED ORDER — OXYMETAZOLINE HCL 0.05 % NA SOLN
1.0000 | Freq: Two times a day (BID) | NASAL | Status: DC | PRN
  Filled 2021-03-22: qty 30

## 2021-03-22 MED ORDER — SODIUM CHLORIDE 0.9 % IV SOLN: 250.0000 mL | INTRAVENOUS | Status: DC

## 2021-03-22 MED ORDER — LORATADINE-PSEUDOEPHEDRINE ER 5-120 MG PO TB12
1.0000 | ORAL_TABLET | Freq: Two times a day (BID) | ORAL | Status: DC | PRN
Start: 2021-03-22 — End: 2021-03-22

## 2021-03-22 MED ORDER — MIDAZOLAM HCL 5 MG/5ML IJ SOLN
INTRAMUSCULAR | Status: DC | PRN
  Administered 2021-03-22: 2 mg via INTRAVENOUS

## 2021-03-22 MED ORDER — BUPIVACAINE-EPINEPHRINE (PF) 0.5% -1:200000 IJ SOLN
INTRAMUSCULAR | Status: DC | PRN
  Administered 2021-03-22: 10 mL via PERINEURAL

## 2021-03-22 MED ORDER — PSEUDOEPHEDRINE HCL ER 120 MG PO TB12
120.0000 mg | ORAL_TABLET | Freq: Two times a day (BID) | ORAL | Status: DC | PRN
  Filled 2021-03-22: qty 1

## 2021-03-22 MED ORDER — BUPIVACAINE LIPOSOME 1.3 % IJ SUSP
INTRAMUSCULAR | Status: DC | PRN
  Administered 2021-03-22: 20 mL

## 2021-03-22 MED ORDER — LIDOCAINE 2% (20 MG/ML) 5 ML SYRINGE
INTRAMUSCULAR | Status: DC | PRN
  Administered 2021-03-22: 60 mg via INTRAVENOUS

## 2021-03-22 MED ORDER — BUPIVACAINE LIPOSOME 1.3 % IJ SUSP
INTRAMUSCULAR | Status: AC
  Filled 2021-03-22: qty 20

## 2021-03-22 MED ORDER — SODIUM CHLORIDE 0.9% FLUSH
3.0000 mL | Freq: Two times a day (BID) | INTRAVENOUS | Status: DC
  Administered 2021-03-23 (×2): 3 mL via INTRAVENOUS

## 2021-03-22 MED ORDER — FENTANYL CITRATE (PF) 250 MCG/5ML IJ SOLN
INTRAMUSCULAR | Status: DC | PRN
  Administered 2021-03-22 (×3): 50 ug via INTRAVENOUS
  Administered 2021-03-22: 100 ug via INTRAVENOUS
  Administered 2021-03-22: 50 ug via INTRAVENOUS

## 2021-03-22 MED ORDER — DEXAMETHASONE SODIUM PHOSPHATE 10 MG/ML IJ SOLN
INTRAMUSCULAR | Status: AC
  Filled 2021-03-22: qty 1

## 2021-03-22 MED ORDER — SCOPOLAMINE 1 MG/3DAYS TD PT72
MEDICATED_PATCH | TRANSDERMAL | Status: AC
  Filled 2021-03-22: qty 1

## 2021-03-22 MED ORDER — FENOFIBRATE 160 MG PO TABS
160.0000 mg | ORAL_TABLET | Freq: Every day | ORAL | Status: DC
  Administered 2021-03-22 – 2021-03-24 (×3): 160 mg via ORAL
  Filled 2021-03-22 (×3): qty 1

## 2021-03-22 MED ORDER — DEXAMETHASONE SODIUM PHOSPHATE 10 MG/ML IJ SOLN
INTRAMUSCULAR | Status: DC | PRN
  Administered 2021-03-22: 10 mg via INTRAVENOUS

## 2021-03-22 MED ORDER — PHENYLEPHRINE HCL (PRESSORS) 10 MG/ML IV SOLN
INTRAVENOUS | Status: AC
  Filled 2021-03-22: qty 1

## 2021-03-22 MED ORDER — MENTHOL 3 MG MT LOZG: 1.0000 | LOZENGE | OROMUCOSAL | Status: DC | PRN

## 2021-03-22 MED ORDER — THROMBIN 5000 UNITS EX SOLR: OROMUCOSAL | Status: DC | PRN

## 2021-03-22 MED ORDER — CYCLOBENZAPRINE HCL 10 MG PO TABS
10.0000 mg | ORAL_TABLET | Freq: Three times a day (TID) | ORAL | Status: DC | PRN
  Administered 2021-03-22: 10 mg via ORAL
  Filled 2021-03-22: qty 1

## 2021-03-22 MED ORDER — ORAL CARE MOUTH RINSE: 15.0000 mL | Freq: Once | OROMUCOSAL | Status: AC

## 2021-03-22 MED ORDER — BUPIVACAINE-EPINEPHRINE 0.5% -1:200000 IJ SOLN
INTRAMUSCULAR | Status: AC
  Filled 2021-03-22: qty 1

## 2021-03-22 SURGICAL SUPPLY — 77 items
BAG COUNTER SPONGE SURGICOUNT (BAG) ×2 IMPLANT
BAG SURGICOUNT SPONGE COUNTING (BAG) ×1
BASKET BONE COLLECTION (BASKET) ×3 IMPLANT
BENZOIN TINCTURE PRP APPL 2/3 (GAUZE/BANDAGES/DRESSINGS) ×3 IMPLANT
BLADE CLIPPER SURG (BLADE) IMPLANT
BUR MATCHSTICK NEURO 3.0 LAGG (BURR) ×3 IMPLANT
BUR PRECISION FLUTE 6.0 (BURR) ×3 IMPLANT
CANISTER SUCT 3000ML PPV (MISCELLANEOUS) ×3 IMPLANT
CAP LOCK DLX THRD (Cap) ×12 IMPLANT
CARTRIDGE OIL MAESTRO DRILL (MISCELLANEOUS) ×1 IMPLANT
CLOSURE STERI-STRIP 1/2X4 (GAUZE/BANDAGES/DRESSINGS) ×1
CLOSURE WOUND 1/2 X4 (GAUZE/BANDAGES/DRESSINGS) ×1
CLSR STERI-STRIP ANTIMIC 1/2X4 (GAUZE/BANDAGES/DRESSINGS) ×2 IMPLANT
CNTNR URN SCR LID CUP LEK RST (MISCELLANEOUS) ×1 IMPLANT
CONT SPEC 4OZ STRL OR WHT (MISCELLANEOUS) ×2
COVER BACK TABLE 60X90IN (DRAPES) ×3 IMPLANT
DECANTER SPIKE VIAL GLASS SM (MISCELLANEOUS) IMPLANT
DIFFUSER DRILL AIR PNEUMATIC (MISCELLANEOUS) ×3 IMPLANT
DRAPE C-ARM 42X72 X-RAY (DRAPES) ×6 IMPLANT
DRAPE HALF SHEET 40X57 (DRAPES) ×3 IMPLANT
DRAPE LAPAROTOMY 100X72X124 (DRAPES) ×3 IMPLANT
DRAPE SHEET LG 3/4 BI-LAMINATE (DRAPES) ×3 IMPLANT
DRAPE SURG 17X23 STRL (DRAPES) ×12 IMPLANT
DRSG OPSITE POSTOP 4X6 (GAUZE/BANDAGES/DRESSINGS) ×3 IMPLANT
ELECT BLADE 4.0 EZ CLEAN MEGAD (MISCELLANEOUS) ×6
ELECT REM PT RETURN 9FT ADLT (ELECTROSURGICAL) ×3
ELECTRODE BLDE 4.0 EZ CLN MEGD (MISCELLANEOUS) ×2 IMPLANT
ELECTRODE REM PT RTRN 9FT ADLT (ELECTROSURGICAL) ×1 IMPLANT
EVACUATOR 1/8 PVC DRAIN (DRAIN) IMPLANT
GAUZE 4X4 16PLY ~~LOC~~+RFID DBL (SPONGE) ×3 IMPLANT
GLOVE BIOGEL PI IND STRL 7.5 (GLOVE) ×3 IMPLANT
GLOVE BIOGEL PI INDICATOR 7.5 (GLOVE) ×6
GLOVE EXAM NITRILE XL STR (GLOVE) IMPLANT
GLOVE SURG ENC MOIS LTX SZ8 (GLOVE) ×6 IMPLANT
GLOVE SURG ENC MOIS LTX SZ8.5 (GLOVE) ×6 IMPLANT
GLOVE SURG POLYISO LF SZ7 (GLOVE) ×3 IMPLANT
GLOVE SURG UNDER POLY LF SZ7.5 (GLOVE) ×3 IMPLANT
GOWN STRL REUS W/ TWL LRG LVL3 (GOWN DISPOSABLE) IMPLANT
GOWN STRL REUS W/ TWL XL LVL3 (GOWN DISPOSABLE) ×3 IMPLANT
GOWN STRL REUS W/TWL 2XL LVL3 (GOWN DISPOSABLE) IMPLANT
GOWN STRL REUS W/TWL LRG LVL3 (GOWN DISPOSABLE)
GOWN STRL REUS W/TWL XL LVL3 (GOWN DISPOSABLE) ×6
HEMOSTAT POWDER KIT SURGIFOAM (HEMOSTASIS) ×6 IMPLANT
KIT BASIN OR (CUSTOM PROCEDURE TRAY) ×3 IMPLANT
KIT GRAFTMAG DEL NEURO DISP (NEUROSURGERY SUPPLIES) ×3 IMPLANT
KIT TURNOVER KIT B (KITS) ×3 IMPLANT
MILL MEDIUM DISP (BLADE) ×3 IMPLANT
NEEDLE HYPO 21X1.5 SAFETY (NEEDLE) ×3 IMPLANT
NEEDLE HYPO 22GX1.5 SAFETY (NEEDLE) ×3 IMPLANT
NEEDLE SPNL 18GX3.5 QUINCKE PK (NEEDLE) ×3 IMPLANT
NS IRRIG 1000ML POUR BTL (IV SOLUTION) ×3 IMPLANT
OIL CARTRIDGE MAESTRO DRILL (MISCELLANEOUS) ×3
PACK LAMINECTOMY NEURO (CUSTOM PROCEDURE TRAY) ×3 IMPLANT
PAD ARMBOARD 7.5X6 YLW CONV (MISCELLANEOUS) ×18 IMPLANT
PATTIES SURGICAL .5 X1 (DISPOSABLE) IMPLANT
PATTIES SURGICAL 1X1 (DISPOSABLE) ×3 IMPLANT
PENCIL BUTTON HOLSTER BLD 10FT (ELECTRODE) ×3 IMPLANT
PUTTY DBM 10CC CALC GRAN (Putty) ×3 IMPLANT
PUTTY DBM 5CC CALC GRAN (Putty) ×3 IMPLANT
ROD CURVED TI 6.35X45 (Rod) ×6 IMPLANT
SCREW PA DLX CREO 7.5X55 (Screw) ×12 IMPLANT
SPACER ALTERA 10X31-15 (Spacer) ×3 IMPLANT
SPONGE NEURO XRAY DETECT 1X3 (DISPOSABLE) IMPLANT
SPONGE SURGIFOAM ABS GEL 100 (HEMOSTASIS) IMPLANT
SPONGE T-LAP 4X18 ~~LOC~~+RFID (SPONGE) IMPLANT
STRIP CLOSURE SKIN 1/2X4 (GAUZE/BANDAGES/DRESSINGS) ×2 IMPLANT
SUT VIC AB 0 CT1 18XCR BRD8 (SUTURE) ×1 IMPLANT
SUT VIC AB 0 CT1 8-18 (SUTURE) ×2
SUT VIC AB 1 CT1 18XBRD ANBCTR (SUTURE) ×1 IMPLANT
SUT VIC AB 1 CT1 8-18 (SUTURE) ×2
SUT VIC AB 2-0 CP2 18 (SUTURE) ×6 IMPLANT
SUT VIC AB 2-0 CT1 18 (SUTURE) IMPLANT
SYR 20ML LL LF (SYRINGE) IMPLANT
TOWEL GREEN STERILE (TOWEL DISPOSABLE) ×3 IMPLANT
TOWEL GREEN STERILE FF (TOWEL DISPOSABLE) ×3 IMPLANT
TRAY FOLEY MTR SLVR 16FR STAT (SET/KITS/TRAYS/PACK) ×3 IMPLANT
WATER STERILE IRR 1000ML POUR (IV SOLUTION) ×3 IMPLANT

## 2021-03-22 NOTE — Anesthesia Procedure Notes (Signed)
Procedure Name: Intubation Date/Time: 03/22/2021 11:32 AM Performed by: Wilburn Cornelia, CRNA Pre-anesthesia Checklist: Patient identified, Emergency Drugs available, Suction available, Patient being monitored and Timeout performed Patient Re-evaluated:Patient Re-evaluated prior to induction Oxygen Delivery Method: Circle system utilized Preoxygenation: Pre-oxygenation with 100% oxygen Induction Type: IV induction Ventilation: Mask ventilation without difficulty Laryngoscope Size: Mac and 4 Grade View: Grade II Tube type: Oral Tube size: 7.5 mm Number of attempts: 1 Airway Equipment and Method: Stylet Placement Confirmation: ETT inserted through vocal cords under direct vision, positive ETCO2, CO2 detector and breath sounds checked- equal and bilateral Secured at: 23 cm Tube secured with: Tape Dental Injury: Teeth and Oropharynx as per pre-operative assessment

## 2021-03-22 NOTE — Transfer of Care (Signed)
Immediate Anesthesia Transfer of Care Note  Patient: OTHA MONICAL  Procedure(s) Performed: Posterior Lumbar Interbody Fusion ,Interbody prothesis ,POSTERIOR INSTRUMENTATION, Lumbar two -three (Spine Lumbar)  Patient Location: PACU  Anesthesia Type:General  Level of Consciousness: drowsy  Airway & Oxygen Therapy: Patient Spontanous Breathing and Patient connected to nasal cannula oxygen  Post-op Assessment: Report given to RN and Post -op Vital signs reviewed and stable  Post vital signs: Reviewed and stable  Last Vitals:  Vitals Value Taken Time  BP 133/97 03/22/21 1532  Temp    Pulse 93 03/22/21 1536  Resp 17 03/22/21 1535  SpO2 95 % 03/22/21 1536  Vitals shown include unvalidated device data.  Last Pain:  Vitals:   03/22/21 0943  TempSrc:   PainSc: 0-No pain         Complications: No notable events documented.

## 2021-03-22 NOTE — Op Note (Signed)
Brief history: The patient is a 70 year old white male who is complained of back and bilateral leg pain.  He has failed medical management.  He was worked up with a lumbar myelo CT which demonstrated severe spinal stenosis at L2-3.  I discussed the various treatment options with him.  He has decided proceed with surgery.  Preoperative diagnosis: L2-3 spinal stenosis  lumbago; lumbar radiculopathy; neurogenic claudication  Postoperative diagnosis: The same  Procedure: Bilateral L2-3 laminotomy/foraminotomies/medial facetectomy to decompress the bilateral L2 and L3 nerve roots(the work required to do this was in addition to the work required to do the posterior lumbar interbody fusion because of the patient's spinal stenosis, facet arthropathy. Etc. requiring a wide decompression of the nerve roots.);  Right L2-3 transforaminal lumbar interbody fusion with local morselized autograft bone and Zimmer DBM; insertion of interbody prosthesis at L2-3 (globus peek expandable interbody prosthesis); posterior nonsegmental instrumentation from L2 to L3 with globus titanium pedicle screws and rods; posterior lateral arthrodesis at L2-3 with local morselized autograft bone and Zimmer DBM.  Surgeon: Dr. Earle Gell  Asst.: Arnetha Massy, NP  Anesthesia: Gen. endotracheal  Estimated blood loss: 200 cc  Drains: None  Complications: None  Description of procedure: The patient was brought to the operating room by the anesthesia team. General endotracheal anesthesia was induced. The patient was turned to the prone position on the Wilson frame. The patient's lumbosacral region was then prepared with Betadine scrub and Betadine solution. Sterile drapes were applied.  I then injected the area to be incised with Marcaine with epinephrine solution. I then used the scalpel to make a linear midline incision over the L2-3 interspace. I then used electrocautery to perform a bilateral subperiosteal dissection exposing the  spinous process and lamina of L2 and L3. We then obtained intraoperative radiograph to confirm our location. We then inserted the Verstrac retractor to provide exposure.  I began the decompression by using the high speed drill to perform laminotomies at L2-3 bilaterally. We then used the Kerrison punches to widen the laminotomy and removed the ligamentum flavum at L2-3 bilaterally. We used the Kerrison punches to remove the medial facets at L2-3 bilaterally, we removed the right L2-3 facet. We performed wide foraminotomies about the bilateral L2 and L3 nerve roots completing the decompression.  We now turned our attention to the posterior lumbar interbody fusion. I used a scalpel to incise the intervertebral disc at L2-3 bilaterally. I then performed a partial intervertebral discectomy at L2-3 bilaterally using the pituitary forceps. We prepared the vertebral endplates at I0-9 bilaterally for the fusion by removing the soft tissues with the curettes. We then used the trial spacers to pick the appropriate sized interbody prosthesis. We prefilled his prosthesis with a combination of local morselized autograft bone that we obtained during the decompression as well as Zimmer DBM. We inserted the prefilled prosthesis into the interspace at L2-3 from the right, we then turned and expanded the prosthesis. There was a good snug fit of the prosthesis in the interspace. We then filled and the remainder of the intervertebral disc space with local morselized autograft bone and Zimmer DBM. This completed the posterior lumbar interbody arthrodesis.  During the decompression and insertion of the prosthesis the assistant protected the thecal sac and nerve roots with the D'Errico retractor.  We now turned attention to the instrumentation. Under fluoroscopic guidance we cannulated the bilateral L2 and L3 pedicles with the bone probe. We then removed the bone probe. We then tapped the pedicle with a  6.5 millimeter tap. We then  removed the tap. We probed inside the tapped pedicle with a ball probe to rule out cortical breaches. We then inserted a 7.5 x 55 millimeter pedicle screw into the L2 and L3 pedicles bilaterally under fluoroscopic guidance. We then palpated along the medial aspect of the pedicles to rule out cortical breaches. There were none. The nerve roots were not injured. We then connected the unilateral pedicle screws with a lordotic rod. We compressed the construct and secured the rod in place with the caps. We then tightened the caps appropriately. This completed the instrumentation from L2-3 bilaterally.  We now turned our attention to the posterior lateral arthrodesis at L2-3. We used the high-speed drill to decorticate the remainder of the facets, pars, transverse process at L2-3. We then applied a combination of local morselized autograft bone and Zimmer DBM over these decorticated posterior lateral structures. This completed the posterior lateral arthrodesis.  We then obtained hemostasis using bipolar electrocautery. We irrigated the wound out with bacitracin solution. We inspected the thecal sac and nerve roots and noted they were well decompressed. We then removed the retractor.  We injected Exparel . We reapproximated patient's thoracolumbar fascia with interrupted #1 Vicryl suture. We reapproximated patient's subcutaneous tissue with interrupted 2-0 Vicryl suture. The reapproximated patient's skin with Steri-Strips and benzoin. The wound was then coated with bacitracin ointment. A sterile dressing was applied. The drapes were removed. The patient was subsequently returned to the supine position where they were extubated by the anesthesia team. He was then transported to the post anesthesia care unit in stable condition. All sponge instrument and needle counts were reportedly correct at the end of this case.

## 2021-03-22 NOTE — Progress Notes (Signed)
Orthopedic Tech Progress Note Patient Details:  Paul Rodriguez [birth year] [id number] Dropped off LSO to 3C06 Ortho Devices Type of Ortho Device: Lumbar corsett Ortho Device/Splint Location: BACK Ortho Device/Splint Interventions: Ordered   Post Interventions Patient Tolerated: Well Instructions Provided: [facility] [place] 03/22/2021, 4:58 PM

## 2021-03-22 NOTE — H&P (Signed)
Subjective: The patient is a 70 year old white male who has complained of back and right greater left leg pain consistent with neurogenic claudication.  He has failed medical management and was worked up with a lumbar myelo CT which demonstrated a lumbar spondylolisthesis, lumbar spinal stenosis, etc.  I discussed the various treatment options with him.  He has decided proceed with surgery.  Past Medical History:  Diagnosis Date   Allergy    Arthritis    Rheumatoid arthritis   Complication of anesthesia    2014-Pt reported that BP dropped durign procedure.   Depression    GERD (gastroesophageal reflux disease)    History of kidney stones    Hyperlipidemia    Hypertension     Past Surgical History:  Procedure Laterality Date   COLONOSCOPY WITH PROPOFOL N/A 07/23/2018   Procedure: COLONOSCOPY WITH PROPOFOL;  Surgeon: Lin Landsman, MD;  Location: [facility];  Service: Endoscopy;  Laterality: N/A;  Requests early   EYE SURGERY  Lasik   eyelid surgery Bilateral 06/2020   HIP SURGERY Right 2014   arthroscopic   HIP SURGERY Left 03/2013   arthroscopic   KNEE SURGERY Left 03/2013   NOSE SURGERY     POLYPECTOMY  07/23/2018   Procedure: POLYPECTOMY;  Surgeon: Lin Landsman, MD;  Location: [facility];  Service: Endoscopy;;    Allergies  Allergen Reactions   Ultram [Tramadol] Anaphylaxis    Social History   Tobacco Use   Smoking status: Former    Packs/day: 1.00    Years: 10.00    Pack years: 10.00    Types: Cigarettes    Quit date: 05/22/1991    Years since quitting: 29.8   Smokeless tobacco: Never  Substance Use Topics   Alcohol use: Yes    Alcohol/week: 5.0 standard drinks    Types: 5 Standard drinks or equivalent per week    Comment: on occasion/socially    Family History  Problem Relation Age of Onset   Hypertension Mother    Hypertension Father    Heart disease Father    Stroke Father    Diabetes Maternal Grandfather    Heart disease  Maternal Grandmother    Cancer Paternal Grandmother    Cancer Paternal Grandfather    Prior to Admission medications   Medication Sig Start Date End Date Taking? Authorizing Provider  acetaminophen (TYLENOL) 325 MG tablet Take 650 mg by mouth every 6 (six) hours as needed for moderate pain.   Yes [provider]  Cholecalciferol (VITAMIN D3) 25 MCG (1000 UT) CAPS Take 1,000 Units by mouth daily.   Yes [provider]  fenofibrate (TRICOR) 145 MG tablet Take 1 tablet (145 mg total) by mouth daily. 02/28/21  Yes Vigg, Avanti, MD  folic acid (FOLVITE) 1 MG tablet Take 1 mg by mouth daily.   Yes [provider]  loratadine-pseudoephedrine (CLARITIN-D 12-HOUR) 5-120 MG tablet Take 1 tablet by mouth every 12 (twelve) hours as needed for allergies.   Yes [provider]  methotrexate (RHEUMATREX) 2.5 MG tablet Take 15 mg by mouth once a week.  09/23/17  Yes [provider]  Multiple Vitamin (MULTIVITAMIN) tablet Take 1 tablet by mouth daily.   Yes [provider]  Omega-3 Fatty Acids (FISH OIL) 1000 MG CAPS Take 1,000 mg by mouth daily.   Yes [provider]  omeprazole (PRILOSEC) 20 MG capsule Take 20 mg by mouth daily. 06/21/19  Yes [provider]  oxymetazoline (AFRIN) 0.05 % nasal  spray Place 1 spray into both nostrils 2 (two) times daily as needed for congestion.   Yes [provider]  Polyethyl Glycol-Propyl Glycol (SYSTANE OP) Place 1 drop into both eyes daily as needed (dry eyes).   Yes [provider]  tiZANidine (ZANAFLEX) 4 MG tablet Take 4 mg by mouth at bedtime as needed for muscle spasms. 02/03/21  Yes [provider]  TURMERIC CURCUMIN PO Take 1 capsule by mouth daily.   Yes [provider]  valsartan (DIOVAN) 160 MG tablet Take 160 mg by mouth daily. 01/09/21 01/09/22 Yes [provider]  vitamin B-12 (CYANOCOBALAMIN) 1000 MCG tablet Take 1,000 mcg by mouth daily.   Yes  [provider]  EPINEPHrine 0.3 mg/0.3 mL IJ SOAJ injection Inject 0.3 mg into the muscle as needed for anaphylaxis. 11/17/12   [provider]     Review of Systems  Positive ROS: As above  All other systems have been reviewed and were otherwise negative with the exception of those mentioned in the HPI and as above.  Objective: Vital signs in last 24 hours: Temp:  [98 F (36.7 C)] 98 F (36.7 C) (11/02 0906) Pulse Rate:  [97] 97 (11/02 0906) Resp:  [18] 18 (11/02 0906) BP: (182)/(102) 182/102 (11/02 0906) SpO2:  [98 %] 98 % (11/02 0906) Weight:  [104.3 kg] 104.3 kg (11/02 0906) Estimated body mass index is 33.97 kg/m as calculated from the following:   Height as of this encounter: 5\' 9"  (1.753 m).   Weight as of this encounter: 104.3 kg.   General Appearance: Alert Head: Normocephalic, without obvious abnormality, atraumatic Eyes: PERRL, conjunctiva/corneas clear, EOM's intact,    Ears: Normal  Throat: Normal  Neck: Supple, Back: unremarkable Lungs: Clear to auscultation bilaterally, respirations unlabored Heart: Regular rate and rhythm, no murmur, rub or gallop Abdomen: Soft, non-tender Extremities: Extremities normal, atraumatic, no cyanosis or edema Skin: unremarkable  NEUROLOGIC:   Mental status: alert and oriented,Motor Exam - grossly normal Sensory Exam - grossly normal Reflexes:  Coordination - grossly normal Gait - grossly normal Balance - grossly normal Cranial Nerves: I: smell Not tested  II: visual acuity  OS: Normal  OD: Normal   II: visual fields Full to confrontation  II: pupils Equal, round, reactive to light  III,VII: ptosis None  III,IV,VI: extraocular muscles  Full ROM  V: mastication Normal  V: facial light touch sensation  Normal  V,VII: corneal reflex  Present  VII: facial muscle function - upper  Normal  VII: facial muscle function - lower Normal  VIII: hearing Not tested  IX: soft palate elevation  Normal  IX,X:  gag reflex Present  XI: trapezius strength  5/5  XI: sternocleidomastoid strength 5/5  XI: neck flexion strength  5/5  XII: tongue strength  Normal    Data Review Lab Results  Component Value Date   WBC 6.8 03/20/2021   HGB 15.0 03/20/2021   HCT 44.1 03/20/2021   MCV 97.4 03/20/2021   PLT 226 03/20/2021   Lab Results  Component Value Date   NA 138 03/20/2021   K 4.5 03/20/2021   CL 105 03/20/2021   CO2 25 03/20/2021   BUN 18 03/20/2021   CREATININE 1.21 03/20/2021   GLUCOSE 95 03/20/2021   No results found for: INR, PROTIME  Assessment/Plan: Lumbar spondylolisthesis lumbar spinal stenosis, lumbago, lumbar radiculopathy, neurogenic claudication: I have discussed the situation with the patient.  I reviewed his imaging studies with him and pointed out the abnormalities.  We have discussed the various treatment options including surgery.  I have described the surgical treatment option of an L2-3 decompression, instrumentation and fusion.  I have shown him surgical models.  I have given him a surgical pamphlet.  We have discussed the risk, benefits, alternatives, expected postop course, and likelihood of achieving our goals with surgery.  I have answered all his questions.  He has decided proceed with surgery.   Ophelia Charter 03/22/2021 10:42 AM

## 2021-03-23 DIAGNOSIS — Z888 Allergy status to other drugs, medicaments and biological substances status: Secondary | ICD-10-CM | POA: Diagnosis not present

## 2021-03-23 DIAGNOSIS — M48062 Spinal stenosis, lumbar region with neurogenic claudication: Secondary | ICD-10-CM | POA: Diagnosis not present

## 2021-03-23 DIAGNOSIS — M5416 Radiculopathy, lumbar region: Secondary | ICD-10-CM | POA: Diagnosis not present

## 2021-03-23 DIAGNOSIS — I10 Essential (primary) hypertension: Secondary | ICD-10-CM | POA: Diagnosis not present

## 2021-03-23 DIAGNOSIS — M4316 Spondylolisthesis, lumbar region: Secondary | ICD-10-CM | POA: Diagnosis not present

## 2021-03-23 DIAGNOSIS — M069 Rheumatoid arthritis, unspecified: Secondary | ICD-10-CM | POA: Diagnosis not present

## 2021-03-23 LAB — CBC
HCT: 37.2 % — ABNORMAL LOW (ref 39.0–52.0)
Hemoglobin: 13.2 g/dL (ref 13.0–17.0)
MCH: 33.3 pg (ref 26.0–34.0)
MCHC: 35.5 g/dL (ref 30.0–36.0)
MCV: 93.9 fL (ref 80.0–100.0)
Platelets: 213 10*3/uL (ref 150–400)
RBC: 3.96 MIL/uL — ABNORMAL LOW (ref 4.22–5.81)
RDW: 13 % (ref 11.5–15.5)
WBC: 13.9 10*3/uL — ABNORMAL HIGH (ref 4.0–10.5)
nRBC: 0 % (ref 0.0–0.2)

## 2021-03-23 LAB — BASIC METABOLIC PANEL
Anion gap: 8 (ref 5–15)
BUN: 15 mg/dL (ref 8–23)
CO2: 21 mmol/L — ABNORMAL LOW (ref 22–32)
Calcium: 8.7 mg/dL — ABNORMAL LOW (ref 8.9–10.3)
Chloride: 108 mmol/L (ref 98–111)
Creatinine, Ser: 1.25 mg/dL — ABNORMAL HIGH (ref 0.61–1.24)
GFR, Estimated: >60 mL/min (ref >60)
Glucose, Bld: 126 mg/dL — ABNORMAL HIGH (ref 70–99)
Potassium: 4.5 mmol/L (ref 3.5–5.1)
Sodium: 137 mmol/L (ref 135–145)

## 2021-03-23 NOTE — Evaluation (Signed)
Physical Therapy Evaluation and Discharge Patient Details Name: Paul Rodriguez MRN: [id number] DOB: June 02, 1950 Today's Date: 03/23/2021  History of Present Illness  Pt is a 70 y/o male who presents s/p L2-L3 TLIF on 03/22/2021. PMH significant for RA, HTN, Bilateral arthroscopic hip surgery, L knee surgery.   Clinical Impression  Pt admitted with above diagnosis. At the time of PT eval, pt was able to demonstrate transfers and ambulation with gross modified independence to supervision for safety and no AD. Pt was educated on precautions, brace application/wearing schedule, appropriate activity progression, and car transfer. Pt currently with functional limitations due to the deficits listed below (see PT Problem List). Pt will benefit from skilled PT to increase their independence and safety with mobility to allow discharge to the venue listed below.         Recommendations for follow up therapy are one component of a multi-disciplinary discharge planning process, led by the attending physician.  Recommendations may be updated based on patient status, additional functional criteria and insurance authorization.  Follow Up Recommendations No PT follow up    Assistance Recommended at Discharge PRN  Functional Status Assessment Patient has had a recent decline in their functional status and demonstrates the ability to make significant improvements in function in a reasonable and predictable amount of time.  Equipment Recommendations  None recommended by PT    Recommendations for Other Services       Precautions / Restrictions Precautions Precautions: Fall;Back Precaution Booklet Issued: Yes (comment) Precaution Comments: Reviewed handout and pt was cued for precautions during functional mobility. Required Braces or Orthoses: Spinal Brace Spinal Brace: Lumbar corset;Applied in sitting position Restrictions Weight Bearing Restrictions: No      Mobility  Bed Mobility Overal bed  mobility: Needs Assistance (Simultaneous filing. User may not have seen previous data.) Bed Mobility: Rolling;Sidelying to Sit Rolling: Supervision Sidelying to sit: Supervision       General bed mobility comments: Providing education on log roll technique (Simultaneous filing. User may not have seen previous data.)    Transfers Overall transfer level: Needs assistance (Simultaneous filing. User may not have seen previous data.) Equipment used: None Transfers: Sit to/from Stand Sit to Stand: Supervision           General transfer comment: Supervision for safety (Simultaneous filing. User may not have seen previous data.)    Ambulation/Gait Ambulation/Gait assistance: Supervision Gait Distance (Feet): 300 Feet Assistive device: Rolling walker (2 wheels);None Gait Pattern/deviations: Step-through pattern;Decreased stride length;Antalgic Gait velocity: Decreased Gait velocity interpretation: 1.31 - 2.62 ft/sec, indicative of limited community ambulator General Gait Details: Initially with RW progressing to no AD. Pt with mildly antalgic gait pattern due to baseline deficits, however overall appears steady without UE support. Light supervision provided for safety.  Stairs Stairs: Yes Stairs assistance: Min guard;Supervision Stair Management: One rail Left;Step to pattern;Forwards Number of Stairs: 10 General stair comments: VC's for sequencing and general safety. No assist required.  Wheelchair Mobility    Modified Rankin (Stroke Patients Only)       Balance Overall balance assessment: Mild deficits observed, not formally tested (Simultaneous filing. User may not have seen previous data.) Sitting-balance support: No upper extremity supported;Feet supported Sitting balance-Leahy Scale: Fair     Standing balance support: No upper extremity supported;During functional activity Standing balance-Leahy Scale: Fair                               Pertinent  Vitals/Pain Pain Assessment: Faces Faces Pain Scale: Hurts little more Pain Location: Incision site Pain Descriptors / Indicators: Operative site guarding;Sore Pain Intervention(s): Limited activity within patient's tolerance;Monitored during session;Repositioned    Home Living Family/patient expects to be discharged to:: Private residence Living Arrangements: Spouse/significant other Available Help at Discharge: Family;Available 24 hours/day Type of Home: House Home Access: Stairs to enter Entrance Stairs-Rails: None Entrance Stairs-Number of Steps: 3 Alternate Level Stairs-Number of Steps: flight Home Layout: Two level Home Equipment: Conservation officer, nature (2 wheels);Shower seat      Prior Function Prior Level of Function : Independent/Modified Independent                     Hand Dominance        Extremity/Trunk Assessment   Upper Extremity Assessment Upper Extremity Assessment: Defer to OT evaluation    Lower Extremity Assessment Lower Extremity Assessment: Generalized weakness LLE Deficits / Details: Bilaterally, decreased strength and muscular endurance consistent with pre-op diagnosis    Cervical / Trunk Assessment Cervical / Trunk Assessment: Back Surgery  Communication   Communication: No difficulties  Cognition Arousal/Alertness: Awake/alert Behavior During Therapy: WFL for tasks assessed/performed Overall Cognitive Status: Within Functional Limits for tasks assessed                                          General Comments      Exercises     Assessment/Plan    PT Assessment Patient does not need any further PT services  PT Problem List         PT Treatment Interventions      PT Goals (Current goals can be found in the Care Plan section)  Acute Rehab PT Goals Patient Stated Goal: Home today PT Goal Formulation: All assessment and education complete, DC therapy    Frequency     Barriers to discharge         Co-evaluation               AM-PAC PT "6 Clicks" Mobility  Outcome Measure Help needed turning from your back to your side while in a flat bed without using bedrails?: None Help needed moving from lying on your back to sitting on the side of a flat bed without using bedrails?: None Help needed moving to and from a bed to a chair (including a wheelchair)?: None Help needed standing up from a chair using your arms (e.g., wheelchair or bedside chair)?: None Help needed to walk in hospital room?: A Little Help needed climbing 3-5 steps with a railing? : A Little 6 Click Score: 22    End of Session Equipment Utilized During Treatment: Gait belt Activity Tolerance: Patient tolerated treatment well Patient left: in bed;with call bell/phone within reach Nurse Communication: Mobility status PT Visit Diagnosis: Unsteadiness on feet (R26.81);Pain Pain - part of body:  (back)    Time: 2706-2376 PT Time Calculation (min) (ACUTE ONLY): 22 min   Charges:   PT Evaluation $PT Eval Low Complexity: 1 Low          Paul Rodriguez, PT, DPT Acute Rehabilitation Services Pager: [phone] Office: [phone]   Paul Rodriguez 03/23/2021, 9:55 AM

## 2021-03-23 NOTE — Evaluation (Signed)
Occupational Therapy Evaluation Patient Details Name: Paul Rodriguez MRN: [id number] DOB: [birth year] Today's Date: 03/23/2021   History of Present Illness Pt is a 70 y/o male who presents s/p L2-L3 TLIF on 03/22/2021. PMH significant for RA, HTN, Bilateral arthroscopic hip surgery, L knee surgery.   Clinical Impression   PTA, pt was living with his wife and was independent. Currently, pt requires Supervision for ADLs and functional mobility. Provided education and handout on back precautions, bed mobility, brace management, grooming, LB ADLs, toileting, and tub transfer with shower seat; pt demonstrated understanding. Answered all pt questions. Recommend dc home once medically stable per physician. All acute OT needs met and will sign off. Thank you.      Recommendations for follow up therapy are one component of a multi-disciplinary discharge planning process, led by the attending physician.  Recommendations may be updated based on patient status, additional functional criteria and insurance authorization.   Follow Up Recommendations  No OT follow up    Assistance Recommended at Discharge Intermittent Supervision/Assistance  Functional Status Assessment  Patient has had a recent decline in their functional status and demonstrates the ability to make significant improvements in function in a reasonable and predictable amount of time.  Equipment Recommendations  None recommended by OT    Recommendations for Other Services PT consult     Precautions / Restrictions Precautions Precautions: Fall;Back Precaution Booklet Issued: Yes (comment) Precaution Comments: Reviewed handout and pt was cued for precautions during functional mobility. Required Braces or Orthoses: Spinal Brace Spinal Brace: Lumbar corset;Applied in sitting position Restrictions Weight Bearing Restrictions: No      Mobility Bed Mobility Overal bed mobility: Needs Assistance (Simultaneous filing. User may not have  seen previous data.) Bed Mobility: Rolling;Sidelying to Sit Rolling: Supervision Sidelying to sit: Supervision       General bed mobility comments: Providing education on log roll technique (Simultaneous filing. User may not have seen previous data.)    Transfers Overall transfer level: Needs assistance (Simultaneous filing. User may not have seen previous data.) Equipment used: None Transfers: Sit to/from Stand Sit to Stand: Supervision           General transfer comment: Supervision for safety (Simultaneous filing. User may not have seen previous data.)      Balance Overall balance assessment: Mild deficits observed, not formally tested (Simultaneous filing. User may not have seen previous data.) Sitting-balance support: No upper extremity supported;Feet supported Sitting balance-Leahy Scale: Fair     Standing balance support: No upper extremity supported;During functional activity Standing balance-Leahy Scale: Fair                             ADL either performed or assessed with clinical judgement   ADL Overall ADL's : Needs assistance/impaired                                       General ADL Comments: Pt performing ADLs ad functional mobility at Supervision level. Providing education and handout on back precautions, bed mobility, grooming, brace management, LB ADLs, toielting, and tub transfer.     Vision Baseline Vision/History: 1 Wears glasses Patient Visual Report: No change from baseline       Perception     Praxis      Pertinent Vitals/Pain Pain Assessment: Faces Faces Pain Scale: Hurts little more Pain Location: Incision site Pain  Descriptors / Indicators: Operative site guarding;Sore Pain Intervention(s): Limited activity within patient's tolerance;Monitored during session;Repositioned     Hand Dominance     Extremity/Trunk Assessment Upper Extremity Assessment Upper Extremity Assessment: Defer to OT evaluation    Lower Extremity Assessment Lower Extremity Assessment: Generalized weakness LLE Deficits / Details: Bilaterally, decreased strength and muscular endurance consistent with pre-op diagnosis   Cervical / Trunk Assessment Cervical / Trunk Assessment: Back Surgery   Communication Communication Communication: No difficulties   Cognition Arousal/Alertness: Awake/alert Behavior During Therapy: WFL for tasks assessed/performed Overall Cognitive Status: Within Functional Limits for tasks assessed                                       General Comments       Exercises     Shoulder Instructions      Home Living Family/patient expects to be discharged to:: Private residence Living Arrangements: Spouse/significant other Available Help at Discharge: Family;Available 24 hours/day Type of Home: House Home Access: Stairs to enter [employer] of Steps: 3 Entrance Stairs-Rails: None Home Layout: Two level Alternate Level Stairs-Number of Steps: flight Alternate Level Stairs-Rails: Left Bathroom Shower/Tub: Walk-in shower (Upstairs)         Home Equipment: Conservation officer, nature (2 wheels);Shower seat          Prior Functioning/Environment Prior Level of Function : Independent/Modified Independent                        OT Problem List: Decreased range of motion;Impaired balance (sitting and/or standing);Decreased activity tolerance;Decreased knowledge of use of DME or AE;Decreased knowledge of precautions;Pain      OT Treatment/Interventions:      OT Goals(Current goals can be found in the care plan section) Acute Rehab OT Goals Patient Stated Goal: Go home OT Goal Formulation: All assessment and education complete, DC therapy  OT Frequency:     Barriers to D/C:            Co-evaluation              AM-PAC OT "6 Clicks" Daily Activity     Outcome Measure Help from another person eating meals?: None Help from another person taking care  of personal grooming?: A Little Help from another person toileting, which includes using toliet, bedpan, or urinal?: A Little Help from another person bathing (including washing, rinsing, drying)?: A Little Help from another person to put on and taking off regular upper body clothing?: A Little Help from another person to put on and taking off regular lower body clothing?: A Little 6 Click Score: 19   End of Session Equipment Utilized During Treatment: Gait belt;Back brace Nurse Communication: Mobility status  Activity Tolerance: Patient tolerated treatment well Patient left: in chair;with call bell/phone within reach  OT Visit Diagnosis: Unsteadiness on feet (R26.81);Other abnormalities of gait and mobility (R26.89);Muscle weakness (generalized) (M62.81);Pain Pain - part of body:  (Back)                Time: 0762-2633 OT Time Calculation (min): 22 min Charges:  OT General Charges $OT Visit: 1 Visit OT Evaluation $OT Eval Low Complexity: [place], OTR/L Acute Rehab Pager: [phone] Office: [place] 03/23/2021, 9:33 AM

## 2021-03-23 NOTE — Progress Notes (Signed)
   Providing Compassionate, Quality Care - Together   Subjective: Patient reports no issues overnight. He feels he would benefit from staying one more night.  Objective: Vital signs in last 24 hours: Temp:  [97 F (36.1 C)-98.4 F (36.9 C)] 98.4 F (36.9 C) (11/03 0735) Pulse Rate:  [73-91] 87 (11/03 0735) Resp:  [13-20] 20 (11/03 0735) BP: (99-162)/(54-99) 117/87 (11/03 0735) SpO2:  [93 %-99 %] 99 % (11/03 0735)  Intake/Output from previous day: 11/02 0701 - 11/03 0700 In: 1500 [I.V.:1500] Out: 735 [Urine:485; Blood:250] Intake/Output this shift: No intake/output data recorded.  Alert and oriented x 4 PERRLA CN II-XII grossly intact MAE, Strength and sensation intact Incision is covered with Honeycomb dressing and Steri Strips; Dressing has moderate amount of dried sanguinous drainage   Lab Results: Recent Labs    03/20/21 1145 03/23/21 0451  WBC 6.8 13.9*  HGB 15.0 13.2  HCT 44.1 37.2*  PLT 226 213   BMET Recent Labs    03/20/21 1145 03/23/21 0451  NA 138 137  K 4.5 4.5  CL 105 108  CO2 25 21*  GLUCOSE 95 126*  BUN 18 15  CREATININE 1.21 1.25*  CALCIUM 9.4 8.7*    Studies/Results: DG Lumbar Spine 2-3 Views  Result Date: 03/22/2021 CLINICAL DATA:  Intraoperative localization. EXAM: LUMBAR SPINE - 2-3 VIEW COMPARISON:  Earlier same day lumbar spine radiograph at 1223 hours FINDINGS: Intraoperative fluoroscopy for localization purposes. 1 low resolution intraoperative spot view of the lumbar spine was obtained. Interval placement of bilateral pedicle screws and intervertebral spacer are noted. Total fluoroscopy time: 14 seconds Total radiation dose: 12.68 mGy IMPRESSION: Intraoperative fluoroscopy for localization purposes. Please see the performing provider's procedure note for further details. Electronically Signed   By: Ileana Roup M.D.   On: 03/22/2021 15:11   DG Lumbar Spine 2-3 Views  Result Date: 03/22/2021 CLINICAL DATA:  Back pain, assistance  for surgical procedure EXAM: LUMBAR SPINE - 2-3 VIEW COMPARISON:  CT lumbar spine done on 03/01/2021 FINDINGS: Two cross-table lateral views of lumbar spine are submitted for review. In the first image, a localizing probe is noted at L2-L3 level between the spinous processes. In the second image, there is evidence of removal of spinous process of L3 vertebra. IMPRESSION: Portable radiographs were obtained for localization for lumbar spine surgery. Electronically Signed   By: Elmer Picker M.D.   On: 03/22/2021 13:15   DG C-Arm 1-60 Min-No Report  Result Date: 03/22/2021 Fluoroscopy was utilized by the requesting physician.  No radiographic interpretation.    Assessment/Plan: Patient underwent an L2-3 PLIF by Dr. Arnoldo Morale on 03/22/2021. He will stay one more night and discharge tomorrow morning.   LOS: 0 days     Viona Gilmore, DNP, AGNP-C Nurse Practitioner  [facility] [place] [address], [place], [place] [address] P: [phone]    F: [phone]  03/23/2021, 11:02 AM

## 2021-03-24 DIAGNOSIS — M4316 Spondylolisthesis, lumbar region: Secondary | ICD-10-CM | POA: Diagnosis not present

## 2021-03-24 DIAGNOSIS — Z888 Allergy status to other drugs, medicaments and biological substances status: Secondary | ICD-10-CM | POA: Diagnosis not present

## 2021-03-24 DIAGNOSIS — M48062 Spinal stenosis, lumbar region with neurogenic claudication: Secondary | ICD-10-CM | POA: Diagnosis not present

## 2021-03-24 DIAGNOSIS — M5416 Radiculopathy, lumbar region: Secondary | ICD-10-CM | POA: Diagnosis not present

## 2021-03-24 DIAGNOSIS — I10 Essential (primary) hypertension: Secondary | ICD-10-CM | POA: Diagnosis not present

## 2021-03-24 DIAGNOSIS — M069 Rheumatoid arthritis, unspecified: Secondary | ICD-10-CM | POA: Diagnosis not present

## 2021-03-24 MED ORDER — OXYCODONE-ACETAMINOPHEN 5-325 MG PO TABS: 1.0000 | ORAL_TABLET | ORAL | 0 refills | Status: DC | PRN

## 2021-03-24 MED ORDER — DOCUSATE SODIUM 100 MG PO CAPS: 100.0000 mg | ORAL_CAPSULE | Freq: Two times a day (BID) | ORAL | 0 refills | Status: DC

## 2021-03-24 MED ORDER — CYCLOBENZAPRINE HCL 10 MG PO TABS: 10.0000 mg | ORAL_TABLET | Freq: Three times a day (TID) | ORAL | 0 refills | Status: DC | PRN

## 2021-03-24 NOTE — Plan of Care (Signed)
Patient alert and oriented, mae's well, voiding adequate amount of urine, swallowing without difficulty, c/o mild pain at time of discharge. Patient discharged home with family. Script and discharged instructions given to patient. Patient and family stated understanding of instructions given. Patient has an appointment with Dr. Arnoldo Morale

## 2021-03-24 NOTE — Discharge Summary (Signed)
Physician Discharge Summary     Providing Compassionate, Quality Care - Together   Patient ID: Paul Rodriguez MRN: [id number] DOB/AGE: 1951-04-27 70 y.o.  Admit date: 03/22/2021 Discharge date: 03/24/2021  Admission Diagnoses: Lumbar stenosis with neurogenic claudication  Discharge Diagnoses:  Active Problems:   Lumbar stenosis with neurogenic claudication   Discharged Condition: good  Hospital Course: Patient underwent an L2-3 PLIF by Dr. Arnoldo Morale on 03/22/2021. He was admitted to 3C06 following recovery from anesthesia in the PACU. His postoperative course has been uncomplicated. He has worked with both physical and occupational therapies who feel the patient is ready for discharge home. He is ambulating independently and without difficulty. He is tolerating a normal diet. He is not having any bowel or bladder dysfunction. His pain is well-controlled with oral pain medication. He is ready for discharge home.   Consults: None  Significant Diagnostic Studies: radiology: DG Lumbar Spine 2-3 Views  Result Date: 03/22/2021 CLINICAL DATA:  Intraoperative localization. EXAM: LUMBAR SPINE - 2-3 VIEW COMPARISON:  Earlier same day lumbar spine radiograph at 1223 hours FINDINGS: Intraoperative fluoroscopy for localization purposes. 1 low resolution intraoperative spot view of the lumbar spine was obtained. Interval placement of bilateral pedicle screws and intervertebral spacer are noted. Total fluoroscopy time: 14 seconds Total radiation dose: 12.68 mGy IMPRESSION: Intraoperative fluoroscopy for localization purposes. Please see the performing provider's procedure note for further details. Electronically Signed   By: Ileana Roup M.D.   On: 03/22/2021 15:11   DG Lumbar Spine 2-3 Views  Result Date: 03/22/2021 CLINICAL DATA:  Back pain, assistance for surgical procedure EXAM: LUMBAR SPINE - 2-3 VIEW COMPARISON:  CT lumbar spine done on 03/01/2021 FINDINGS: Two cross-table lateral views of lumbar  spine are submitted for review. In the first image, a localizing probe is noted at L2-L3 level between the spinous processes. In the second image, there is evidence of removal of spinous process of L3 vertebra. IMPRESSION: Portable radiographs were obtained for localization for lumbar spine surgery. Electronically Signed   By: Elmer Picker M.D.   On: 03/22/2021 13:15   DG C-Arm 1-60 Min-No Report  Result Date: 03/22/2021 Fluoroscopy was utilized by the requesting physician.  No radiographic interpretation.     Treatments: surgery:  Bilateral L2-3 laminotomy/foraminotomies/medial facetectomy to decompress the bilateral L2 and L3 nerve roots(the work required to do this was in addition to the work required to do the posterior lumbar interbody fusion because of the patient's spinal stenosis, facet arthropathy. Etc. requiring a wide decompression of the nerve roots.);  Right L2-3 transforaminal lumbar interbody fusion with local morselized autograft bone and Zimmer DBM; insertion of interbody prosthesis at L2-3 (globus peek expandable interbody prosthesis); posterior nonsegmental instrumentation from L2 to L3 with globus titanium pedicle screws and rods; posterior lateral arthrodesis at L2-3 with local morselized autograft bone and Zimmer DBM.  Discharge Exam: Blood pressure 114/65, pulse 86, temperature 98.7 F (37.1 C), temperature source Oral, resp. rate 18, height 5\' 9"  (1.753 m), weight 104.3 kg, SpO2 96 %.  Alert and oriented x 4 PERRLA CN II-XII grossly intact MAE, Strength and sensation intact Incision is covered with Honeycomb dressing and Steri Strips; Dressing is clean, dry, and intact   Disposition: Discharge disposition: 01-Home or Self Care        Allergies as of 03/24/2021       Reactions   Ultram [tramadol] Anaphylaxis        Medication List     TAKE these medications    acetaminophen  325 MG tablet Commonly known as: TYLENOL Take 650 mg by mouth every 6  (six) hours as needed for moderate pain.   cyclobenzaprine 10 MG tablet Commonly known as: FLEXERIL Take 1 tablet (10 mg total) by mouth 3 (three) times daily as needed for muscle spasms.   docusate sodium 100 MG capsule Commonly known as: COLACE Take 1 capsule (100 mg total) by mouth 2 (two) times daily.   EPINEPHrine 0.3 mg/0.3 mL Soaj injection Commonly known as: EPI-PEN Inject 0.3 mg into the muscle as needed for anaphylaxis.   fenofibrate 145 MG tablet Commonly known as: TRICOR Take 1 tablet (145 mg total) by mouth daily.   Fish Oil 1000 MG Caps Take 1,000 mg by mouth daily.   folic acid 1 MG tablet Commonly known as: FOLVITE Take 1 mg by mouth daily.   loratadine-pseudoephedrine 5-120 MG tablet Commonly known as: CLARITIN-D 12-hour Take 1 tablet by mouth every 12 (twelve) hours as needed for allergies.   methotrexate 2.5 MG tablet Commonly known as: RHEUMATREX Take 15 mg by mouth once a week.   multivitamin tablet Take 1 tablet by mouth daily.   omeprazole 20 MG capsule Commonly known as: PRILOSEC Take 20 mg by mouth daily.   oxyCODONE-acetaminophen 5-325 MG tablet Commonly known as: Percocet Take 1-2 tablets by mouth every 4 (four) hours as needed for severe pain.   oxymetazoline 0.05 % nasal spray Commonly known as: AFRIN Place 1 spray into both nostrils 2 (two) times daily as needed for congestion.   SYSTANE OP Place 1 drop into both eyes daily as needed (dry eyes).   tiZANidine 4 MG tablet Commonly known as: ZANAFLEX Take 4 mg by mouth at bedtime as needed for muscle spasms.   TURMERIC CURCUMIN PO Take 1 capsule by mouth daily.   valsartan 160 MG tablet Commonly known as: DIOVAN Take 160 mg by mouth daily.   vitamin B-12 1000 MCG tablet Commonly known as: CYANOCOBALAMIN Take 1,000 mcg by mouth daily.   Vitamin D3 25 MCG (1000 UT) Caps Take 1,000 Units by mouth daily.        Follow-up Information     Newman Pies, MD. Go on  04/21/2021.   Specialty: Neurosurgery Why: First post op appointment with x-rays is 04/21/2021 at 8:30 am. Contact information: [address] [place] [address] [phone]                 Signed: Viona Gilmore, DNP, AGNP-C Nurse Practitioner  [facility] [place] [address], [place], [place] [address] P: [phone]    F: [phone]  03/24/2021, 10:45 AM

## 2021-03-24 NOTE — Discharge Instructions (Signed)
Wound Care Keep incision covered and dry for two days.    Do not put any creams, lotions, or ointments on incision. Leave steri-strips on back.  They will fall off by themselves.  Activity Walk each and every day, increasing distance each day. No lifting greater than 5 lbs.  Avoid excessive back motion. No driving for 2 weeks; may ride as a passenger locally.  Diet Resume your normal diet.   Return to Work Will be discussed at your follow up appointment.  Call Your Doctor If Any of These Occur Redness, drainage, or swelling at the wound.  Temperature greater than 101 degrees. Severe pain not relieved by pain medication. Incision starts to come apart.  Follow Up Appt Go to appointment in 3 weeks. Call for problems.  If you have any hardware placed in your spine, you will need an x-ray before your appointment.

## 2021-03-24 NOTE — Anesthesia Postprocedure Evaluation (Signed)
Anesthesia Post Note  Patient: [name]  Procedure(s) Performed: Posterior Lumbar Interbody Fusion ,Interbody prothesis ,POSTERIOR INSTRUMENTATION, Lumbar two -three (Spine Lumbar)     Patient location during evaluation: PACU Anesthesia Type: General Level of consciousness: awake and alert Pain management: pain level controlled Vital Signs Assessment: post-procedure vital signs reviewed and stable Respiratory status: spontaneous breathing, nonlabored ventilation, respiratory function stable and patient connected to nasal cannula oxygen Cardiovascular status: blood pressure returned to baseline and stable Postop Assessment: no apparent nausea or vomiting Anesthetic complications: no   No notable events documented.  Last Vitals:  Vitals:   03/23/21 2318 03/24/21 0328  BP: 125/72 137/79  Pulse: (!) 106 91  Resp: 20 20  Temp: 37.7 C 37.6 C  SpO2: 97% 99%    Last Pain:  Vitals:   03/24/21 0649  TempSrc:   PainSc: 6    Pain Goal: Patients Stated Pain Goal: 3 (03/24/21 0649)                 Paul Rodriguez

## 2021-03-28 DIAGNOSIS — K219 Gastro-esophageal reflux disease without esophagitis: Secondary | ICD-10-CM | POA: Diagnosis not present

## 2021-03-28 DIAGNOSIS — Z9181 History of falling: Secondary | ICD-10-CM | POA: Diagnosis not present

## 2021-03-28 DIAGNOSIS — Z48811 Encounter for surgical aftercare following surgery on the nervous system: Secondary | ICD-10-CM | POA: Diagnosis not present

## 2021-03-28 DIAGNOSIS — E669 Obesity, unspecified: Secondary | ICD-10-CM | POA: Diagnosis not present

## 2021-03-28 DIAGNOSIS — I10 Essential (primary) hypertension: Secondary | ICD-10-CM | POA: Diagnosis not present

## 2021-03-28 DIAGNOSIS — M4316 Spondylolisthesis, lumbar region: Secondary | ICD-10-CM | POA: Diagnosis not present

## 2021-03-28 DIAGNOSIS — M069 Rheumatoid arthritis, unspecified: Secondary | ICD-10-CM | POA: Diagnosis not present

## 2021-03-28 DIAGNOSIS — K5900 Constipation, unspecified: Secondary | ICD-10-CM | POA: Diagnosis not present

## 2021-03-28 MED FILL — Heparin Sodium (Porcine) Inj 1000 Unit/ML: INTRAMUSCULAR | Qty: 30 | Status: AC

## 2021-03-28 MED FILL — Sodium Chloride IV Soln 0.9%: INTRAVENOUS | Qty: 1000 | Status: AC

## 2021-03-29 DIAGNOSIS — K219 Gastro-esophageal reflux disease without esophagitis: Secondary | ICD-10-CM | POA: Diagnosis not present

## 2021-03-29 DIAGNOSIS — M069 Rheumatoid arthritis, unspecified: Secondary | ICD-10-CM | POA: Diagnosis not present

## 2021-03-29 DIAGNOSIS — Z48811 Encounter for surgical aftercare following surgery on the nervous system: Secondary | ICD-10-CM | POA: Diagnosis not present

## 2021-03-29 DIAGNOSIS — K5900 Constipation, unspecified: Secondary | ICD-10-CM | POA: Diagnosis not present

## 2021-03-29 DIAGNOSIS — M4316 Spondylolisthesis, lumbar region: Secondary | ICD-10-CM | POA: Diagnosis not present

## 2021-03-29 DIAGNOSIS — I10 Essential (primary) hypertension: Secondary | ICD-10-CM | POA: Diagnosis not present

## 2021-03-31 DIAGNOSIS — Z48811 Encounter for surgical aftercare following surgery on the nervous system: Secondary | ICD-10-CM | POA: Diagnosis not present

## 2021-03-31 DIAGNOSIS — M4316 Spondylolisthesis, lumbar region: Secondary | ICD-10-CM | POA: Diagnosis not present

## 2021-03-31 DIAGNOSIS — I10 Essential (primary) hypertension: Secondary | ICD-10-CM | POA: Diagnosis not present

## 2021-03-31 DIAGNOSIS — K5900 Constipation, unspecified: Secondary | ICD-10-CM | POA: Diagnosis not present

## 2021-03-31 DIAGNOSIS — M069 Rheumatoid arthritis, unspecified: Secondary | ICD-10-CM | POA: Diagnosis not present

## 2021-03-31 DIAGNOSIS — K219 Gastro-esophageal reflux disease without esophagitis: Secondary | ICD-10-CM | POA: Diagnosis not present

## 2021-04-03 DIAGNOSIS — M069 Rheumatoid arthritis, unspecified: Secondary | ICD-10-CM | POA: Diagnosis not present

## 2021-04-03 DIAGNOSIS — I10 Essential (primary) hypertension: Secondary | ICD-10-CM | POA: Diagnosis not present

## 2021-04-03 DIAGNOSIS — M4316 Spondylolisthesis, lumbar region: Secondary | ICD-10-CM | POA: Diagnosis not present

## 2021-04-03 DIAGNOSIS — Z48811 Encounter for surgical aftercare following surgery on the nervous system: Secondary | ICD-10-CM | POA: Diagnosis not present

## 2021-04-03 DIAGNOSIS — K5900 Constipation, unspecified: Secondary | ICD-10-CM | POA: Diagnosis not present

## 2021-04-03 DIAGNOSIS — K219 Gastro-esophageal reflux disease without esophagitis: Secondary | ICD-10-CM | POA: Diagnosis not present

## 2021-04-05 DIAGNOSIS — K5900 Constipation, unspecified: Secondary | ICD-10-CM | POA: Diagnosis not present

## 2021-04-05 DIAGNOSIS — I10 Essential (primary) hypertension: Secondary | ICD-10-CM | POA: Diagnosis not present

## 2021-04-05 DIAGNOSIS — K219 Gastro-esophageal reflux disease without esophagitis: Secondary | ICD-10-CM | POA: Diagnosis not present

## 2021-04-05 DIAGNOSIS — M4316 Spondylolisthesis, lumbar region: Secondary | ICD-10-CM | POA: Diagnosis not present

## 2021-04-05 DIAGNOSIS — M069 Rheumatoid arthritis, unspecified: Secondary | ICD-10-CM | POA: Diagnosis not present

## 2021-04-05 DIAGNOSIS — Z48811 Encounter for surgical aftercare following surgery on the nervous system: Secondary | ICD-10-CM | POA: Diagnosis not present

## 2021-04-07 DIAGNOSIS — Z48811 Encounter for surgical aftercare following surgery on the nervous system: Secondary | ICD-10-CM | POA: Diagnosis not present

## 2021-04-07 DIAGNOSIS — K5900 Constipation, unspecified: Secondary | ICD-10-CM | POA: Diagnosis not present

## 2021-04-07 DIAGNOSIS — K219 Gastro-esophageal reflux disease without esophagitis: Secondary | ICD-10-CM | POA: Diagnosis not present

## 2021-04-07 DIAGNOSIS — I10 Essential (primary) hypertension: Secondary | ICD-10-CM | POA: Diagnosis not present

## 2021-04-07 DIAGNOSIS — M4316 Spondylolisthesis, lumbar region: Secondary | ICD-10-CM | POA: Diagnosis not present

## 2021-04-07 DIAGNOSIS — M069 Rheumatoid arthritis, unspecified: Secondary | ICD-10-CM | POA: Diagnosis not present

## 2021-04-21 DIAGNOSIS — M4316 Spondylolisthesis, lumbar region: Secondary | ICD-10-CM | POA: Diagnosis not present

## 2021-04-21 DIAGNOSIS — M48062 Spinal stenosis, lumbar region with neurogenic claudication: Secondary | ICD-10-CM | POA: Diagnosis not present

## 2021-05-10 DIAGNOSIS — N1831 Chronic kidney disease, stage 3a: Secondary | ICD-10-CM | POA: Diagnosis not present

## 2021-05-10 DIAGNOSIS — Z79899 Other long term (current) drug therapy: Secondary | ICD-10-CM | POA: Diagnosis not present

## 2021-05-10 DIAGNOSIS — M1990 Unspecified osteoarthritis, unspecified site: Secondary | ICD-10-CM | POA: Diagnosis not present

## 2021-05-11 DIAGNOSIS — L821 Other seborrheic keratosis: Secondary | ICD-10-CM | POA: Diagnosis not present

## 2021-05-11 DIAGNOSIS — D2261 Melanocytic nevi of right upper limb, including shoulder: Secondary | ICD-10-CM | POA: Diagnosis not present

## 2021-05-11 DIAGNOSIS — D2272 Melanocytic nevi of left lower limb, including hip: Secondary | ICD-10-CM | POA: Diagnosis not present

## 2021-05-11 DIAGNOSIS — D225 Melanocytic nevi of trunk: Secondary | ICD-10-CM | POA: Diagnosis not present

## 2021-05-11 DIAGNOSIS — D2271 Melanocytic nevi of right lower limb, including hip: Secondary | ICD-10-CM | POA: Diagnosis not present

## 2021-05-11 DIAGNOSIS — D2262 Melanocytic nevi of left upper limb, including shoulder: Secondary | ICD-10-CM | POA: Diagnosis not present

## 2021-05-11 DIAGNOSIS — L0292 Furuncle, unspecified: Secondary | ICD-10-CM | POA: Diagnosis not present

## 2021-05-17 DIAGNOSIS — M4316 Spondylolisthesis, lumbar region: Secondary | ICD-10-CM | POA: Diagnosis not present

## 2021-05-17 DIAGNOSIS — Z6836 Body mass index (BMI) 36.0-36.9, adult: Secondary | ICD-10-CM | POA: Diagnosis not present

## 2021-05-17 DIAGNOSIS — I10 Essential (primary) hypertension: Secondary | ICD-10-CM | POA: Diagnosis not present

## 2021-06-02 ENCOUNTER — Other Ambulatory Visit: Payer: Self-pay

## 2021-06-02 ENCOUNTER — Encounter: Payer: Self-pay | Admitting: Internal Medicine

## 2021-06-02 DIAGNOSIS — E785 Hyperlipidemia, unspecified: Secondary | ICD-10-CM

## 2021-06-02 MED ORDER — FENOFIBRATE 145 MG PO TABS: 145.0000 mg | ORAL_TABLET | Freq: Every day | ORAL | 4 refills | Status: DC

## 2021-06-06 ENCOUNTER — Other Ambulatory Visit: Payer: Self-pay

## 2021-06-06 DIAGNOSIS — E785 Hyperlipidemia, unspecified: Secondary | ICD-10-CM

## 2021-06-06 MED ORDER — FENOFIBRATE 145 MG PO TABS: 145.0000 mg | ORAL_TABLET | Freq: Every day | ORAL | 4 refills | Status: DC

## 2021-06-13 DIAGNOSIS — M1990 Unspecified osteoarthritis, unspecified site: Secondary | ICD-10-CM | POA: Diagnosis not present

## 2021-06-13 DIAGNOSIS — N1831 Chronic kidney disease, stage 3a: Secondary | ICD-10-CM | POA: Diagnosis not present

## 2021-06-13 DIAGNOSIS — Z79899 Other long term (current) drug therapy: Secondary | ICD-10-CM | POA: Diagnosis not present

## 2021-06-19 DIAGNOSIS — Z7960 Long term (current) use of unspecified immunomodulators and immunosuppressants: Secondary | ICD-10-CM | POA: Diagnosis not present

## 2021-06-19 DIAGNOSIS — M48062 Spinal stenosis, lumbar region with neurogenic claudication: Secondary | ICD-10-CM | POA: Diagnosis not present

## 2021-06-19 DIAGNOSIS — M1990 Unspecified osteoarthritis, unspecified site: Secondary | ICD-10-CM | POA: Diagnosis not present

## 2021-06-19 DIAGNOSIS — N1831 Chronic kidney disease, stage 3a: Secondary | ICD-10-CM | POA: Diagnosis not present

## 2021-06-30 DIAGNOSIS — J019 Acute sinusitis, unspecified: Secondary | ICD-10-CM | POA: Diagnosis not present

## 2021-06-30 DIAGNOSIS — Z03818 Encounter for observation for suspected exposure to other biological agents ruled out: Secondary | ICD-10-CM | POA: Diagnosis not present

## 2021-06-30 DIAGNOSIS — J209 Acute bronchitis, unspecified: Secondary | ICD-10-CM | POA: Diagnosis not present

## 2021-06-30 DIAGNOSIS — B9689 Other specified bacterial agents as the cause of diseases classified elsewhere: Secondary | ICD-10-CM | POA: Diagnosis not present

## 2021-07-10 DIAGNOSIS — E669 Obesity, unspecified: Secondary | ICD-10-CM | POA: Diagnosis not present

## 2021-07-10 DIAGNOSIS — I10 Essential (primary) hypertension: Secondary | ICD-10-CM | POA: Diagnosis not present

## 2021-07-10 DIAGNOSIS — E782 Mixed hyperlipidemia: Secondary | ICD-10-CM | POA: Diagnosis not present

## 2021-07-10 DIAGNOSIS — N183 Chronic kidney disease, stage 3 unspecified: Secondary | ICD-10-CM | POA: Diagnosis not present

## 2021-07-21 DIAGNOSIS — M48062 Spinal stenosis, lumbar region with neurogenic claudication: Secondary | ICD-10-CM | POA: Diagnosis not present

## 2021-07-21 DIAGNOSIS — M4316 Spondylolisthesis, lumbar region: Secondary | ICD-10-CM | POA: Diagnosis not present

## 2021-08-16 ENCOUNTER — Other Ambulatory Visit: Payer: Self-pay

## 2021-08-16 ENCOUNTER — Other Ambulatory Visit: Payer: Medicare Other

## 2021-08-16 DIAGNOSIS — E7800 Pure hypercholesterolemia, unspecified: Secondary | ICD-10-CM

## 2021-08-17 LAB — COMP. METABOLIC PANEL (14)
ALT: 27 IU/L (ref 0–44)
AST: 26 IU/L (ref 0–40)
Albumin/Globulin Ratio: 2.1 (ref 1.2–2.2)
Albumin: 4.5 g/dL (ref 3.8–4.8)
Alkaline Phosphatase: 94 IU/L (ref 44–121)
BUN/Creatinine Ratio: 12 (ref 10–24)
BUN: 14 mg/dL (ref 8–27)
Bilirubin Total: 0.8 mg/dL (ref 0.0–1.2)
CO2: 21 mmol/L (ref 20–29)
Calcium: 9.6 mg/dL (ref 8.6–10.2)
Chloride: 104 mmol/L (ref 96–106)
Creatinine, Ser: 1.21 mg/dL (ref 0.76–1.27)
Globulin, Total: 2.1 g/dL (ref 1.5–4.5)
Glucose: 97 mg/dL (ref 70–99)
Potassium: 4.3 mmol/L (ref 3.5–5.2)
Sodium: 140 mmol/L (ref 134–144)
Total Protein: 6.6 g/dL (ref 6.0–8.5)
eGFR: 65 mL/min/{1.73_m2} (ref >59)

## 2021-08-17 LAB — LIPID PANEL
Chol/HDL Ratio: 4 ratio (ref 0.0–5.0)
Cholesterol, Total: 170 mg/dL (ref 100–199)
HDL: 42 mg/dL (ref >39)
LDL Chol Calc (NIH): 106 mg/dL — ABNORMAL HIGH (ref 0–99)
Triglycerides: 123 mg/dL (ref 0–149)
VLDL Cholesterol Cal: 22 mg/dL (ref 5–40)

## 2021-08-23 ENCOUNTER — Ambulatory Visit (INDEPENDENT_AMBULATORY_CARE_PROVIDER_SITE_OTHER): Payer: Medicare Other | Admitting: Internal Medicine

## 2021-08-23 ENCOUNTER — Encounter: Payer: Self-pay | Admitting: Internal Medicine

## 2021-08-23 VITALS — BP 123/84 | HR 68 | Temp 97.7°F | Ht 69.02 in | Wt 235.8 lb

## 2021-08-23 DIAGNOSIS — M06041 Rheumatoid arthritis without rheumatoid factor, right hand: Secondary | ICD-10-CM

## 2021-08-23 DIAGNOSIS — I10 Essential (primary) hypertension: Secondary | ICD-10-CM | POA: Diagnosis not present

## 2021-08-23 DIAGNOSIS — M06042 Rheumatoid arthritis without rheumatoid factor, left hand: Secondary | ICD-10-CM

## 2021-08-23 DIAGNOSIS — E7800 Pure hypercholesterolemia, unspecified: Secondary | ICD-10-CM

## 2021-08-23 NOTE — Progress Notes (Signed)
? ?BP 123/84   Pulse 68   Temp 97.7 ?F (36.5 ?C) (Oral)   Ht 5' 9.02" (1.753 m)   Wt 235 lb 12.8 oz (107 kg)   SpO2 97%   BMI 34.81 kg/m?   ? ?Subjective:  ? ? Patient ID: Paul Rodriguez, male    DOB: 11-08-1950, 71 y.o.   MRN: [id number] ? ?Chief Complaint  ?Patient presents with  ? Chronic Kidney Disease  ? Benign Prostatic Hypertrophy  ? ? ?HPI: ?Paul Rodriguez is a 71 y.o. male ? ?Benign Prostatic Hypertrophy ?Irritative symptoms do not include frequency or urgency. Pertinent negatives include no chills, dysuria, hematuria, nausea or vomiting.  ? ?Chief Complaint  ?Patient presents with  ? Chronic Kidney Disease  ? Benign Prostatic Hypertrophy  ? ? ?Relevant past medical, surgical, family and social history reviewed and updated as indicated. Interim medical history since our last visit reviewed. ?Allergies and medications reviewed and updated. ? ?Review of Systems  ?Constitutional:  Negative for activity change, appetite change, chills, fatigue and fever.  ?HENT:  Negative for congestion, ear discharge, ear pain and facial swelling.   ?Eyes:  Negative for pain, discharge and itching.  ?Respiratory:  Negative for cough, chest tightness, shortness of breath and wheezing.   ?Cardiovascular:  Negative for chest pain, palpitations and leg swelling.  ?Gastrointestinal:  Negative for abdominal distention, abdominal pain, blood in stool, constipation, diarrhea, nausea and vomiting.  ?Endocrine: Negative for cold intolerance, heat intolerance, polydipsia, polyphagia and polyuria.  ?Genitourinary:  Negative for difficulty urinating, dysuria, flank pain, frequency, hematuria and urgency.  ?Musculoskeletal:  Negative for arthralgias, gait problem, joint swelling and myalgias.  ?Skin:  Negative for color change, rash and wound.  ?Neurological:  Negative for dizziness, tremors, speech difficulty, weakness, light-headedness, numbness and headaches.  ?Hematological:  Does not bruise/bleed easily.   ?Psychiatric/Behavioral:  Negative for agitation, confusion, decreased concentration, sleep disturbance and suicidal ideas.   ? ?Per HPI unless specifically indicated above ? ?   ?Objective:  ?  ?BP 123/84   Pulse 68   Temp 97.7 ?F (36.5 ?C) (Oral)   Ht 5' 9.02" (1.753 m)   Wt 235 lb 12.8 oz (107 kg)   SpO2 97%   BMI 34.81 kg/m?   ?Wt Readings from Last 3 Encounters:  ?08/23/21 235 lb 12.8 oz (107 kg)  ?03/22/21 230 lb (104.3 kg)  ?03/20/21 241 lb 1.6 oz (109.4 kg)  ?  ?Physical Exam ?Vitals and nursing note reviewed.  ?Constitutional:   ?   General: He is not in acute distress. ?   Appearance: Normal appearance. He is not ill-appearing or diaphoretic.  ?HENT:  ?   Head: Normocephalic and atraumatic.  ?   Right Ear: Tympanic membrane and external ear normal. There is no impacted cerumen.  ?   Left Ear: External ear normal.  ?   Nose: No congestion or rhinorrhea.  ?   Mouth/Throat:  ?   Pharynx: No oropharyngeal exudate or posterior oropharyngeal erythema.  ?Eyes:  ?   Conjunctiva/sclera: Conjunctivae normal.  ?   Pupils: Pupils are equal, round, and reactive to light.  ?Cardiovascular:  ?   Rate and Rhythm: Normal rate and regular rhythm.  ?   Heart sounds: No murmur heard. ?  No friction rub. No gallop.  ?Pulmonary:  ?   Effort: No respiratory distress.  ?   Breath sounds: No stridor. No wheezing or rhonchi.  ?Chest:  ?   Chest wall: No tenderness.  ?Abdominal:  ?  General: Abdomen is flat. Bowel sounds are normal.  ?   Palpations: Abdomen is soft. There is no mass.  ?   Tenderness: There is no abdominal tenderness.  ?Musculoskeletal:  ?   Cervical back: Normal range of motion and neck supple. No rigidity or tenderness.  ?   Left lower leg: No edema.  ?Skin: ?   General: Skin is warm and dry.  ?Neurological:  ?   Mental Status: He is alert.  ? ? ?Results for orders placed or performed in visit on 08/16/21  ?Comp. Metabolic Panel (14)  ?Result Value Ref Range  ? Glucose 97 70 - 99 mg/dL  ? BUN 14 8 - 27  mg/dL  ? Creatinine, Ser 1.21 0.76 - 1.27 mg/dL  ? eGFR 65 >59 mL/min/1.73  ? Interpretation: Comment   ? BUN/Creatinine Ratio 12 10 - 24  ? Sodium 140 134 - 144 mmol/L  ? Potassium 4.3 3.5 - 5.2 mmol/L  ? Chloride 104 96 - 106 mmol/L  ? CO2 21 20 - 29 mmol/L  ? Calcium 9.6 8.6 - 10.2 mg/dL  ? Total Protein 6.6 6.0 - 8.5 g/dL  ? Albumin 4.5 3.8 - 4.8 g/dL  ? Globulin, Total 2.1 1.5 - 4.5 g/dL  ? Albumin/Globulin Ratio 2.1 1.2 - 2.2  ? Bilirubin Total 0.8 0.0 - 1.2 mg/dL  ? Alkaline Phosphatase 94 44 - 121 IU/L  ? AST 26 0 - 40 IU/L  ? ALT 27 0 - 44 IU/L  ?Lipid panel  ?Result Value Ref Range  ? Cholesterol, Total 170 100 - 199 mg/dL  ? Triglycerides 123 0 - 149 mg/dL  ? HDL 42 >39 mg/dL  ? VLDL Cholesterol Cal 22 5 - 40 mg/dL  ? LDL Chol Calc (NIH) 106 (H) 0 - 99 mg/dL  ? Chol/HDL Ratio 4.0 0.0 - 5.0 ratio  ? ?   ? ? ?Current Outpatient Medications:  ?  acetaminophen (TYLENOL) 325 MG tablet, Take 650 mg by mouth every 6 (six) hours as needed for moderate pain., Disp: , Rfl:  ?  amLODipine (NORVASC) 5 MG tablet, Take 5 mg by mouth daily., Disp: , Rfl:  ?  Cholecalciferol (VITAMIN D3) 25 MCG (1000 UT) CAPS, Take 1,000 Units by mouth daily., Disp: , Rfl:  ?  docusate sodium (COLACE) 100 MG capsule, Take 1 capsule (100 mg total) by mouth 2 (two) times daily., Disp: 10 capsule, Rfl: 0 ?  EPINEPHrine 0.3 mg/0.3 mL IJ SOAJ injection, Inject 0.3 mg into the muscle as needed for anaphylaxis., Disp: , Rfl:  ?  fenofibrate (TRICOR) 145 MG tablet, Take 1 tablet (145 mg total) by mouth daily., Disp: 30 tablet, Rfl: 4 ?  folic acid (FOLVITE) 1 MG tablet, Take 1 mg by mouth daily., Disp: , Rfl:  ?  loratadine-pseudoephedrine (CLARITIN-D 12-HOUR) 5-120 MG tablet, Take 1 tablet by mouth every 12 (twelve) hours as needed for allergies., Disp: , Rfl:  ?  methotrexate (RHEUMATREX) 2.5 MG tablet, Take 15 mg by mouth once a week. , Disp: , Rfl:  ?  Multiple Vitamin (MULTIVITAMIN) tablet, Take 1 tablet by mouth daily., Disp: , Rfl:  ?   Omega-3 Fatty Acids (FISH OIL) 1000 MG CAPS, Take 1,000 mg by mouth daily., Disp: , Rfl:  ?  omeprazole (PRILOSEC) 20 MG capsule, Take 20 mg by mouth daily., Disp: , Rfl:  ?  oxymetazoline (AFRIN) 0.05 % nasal spray, Place 1 spray into both nostrils 2 (two) times daily as needed for congestion., Disp: ,  Rfl:  ?  Polyethyl Glycol-Propyl Glycol (SYSTANE OP), Place 1 drop into both eyes daily as needed (dry eyes)., Disp: , Rfl:  ?  tiZANidine (ZANAFLEX) 4 MG tablet, Take 4 mg by mouth at bedtime as needed for muscle spasms., Disp: , Rfl:  ?  TURMERIC CURCUMIN PO, Take 1 capsule by mouth daily., Disp: , Rfl:  ?  valsartan (DIOVAN) 160 MG tablet, Take 160 mg by mouth daily., Disp: , Rfl:  ?  vitamin B-12 (CYANOCOBALAMIN) 1000 MCG tablet, Take 1,000 mcg by mouth daily., Disp: , Rfl:   ? ? ?Assessment & Plan:  ?HTN is on norvasc, valsartan for such  ? for such . To see him back in 1 week  ?Sees Dr. Nehemiah Massed ? ? ?2. RA chroinc, stable, asymptomatic from such currently ?Sees Dr, [place] for such  ? is on MTX q weekly sees rheumatology for such  ? is on folic acid and omperzale. ? ?3. HLD - TG - better form 191 - 86 on fenofibrate for such  ?recheck FLP, check LFT's work on diet, SE of meds explained to pt. low fat and high fiber diet explained to pt. ?  ?4. Spinal stenosis @ [place] [facility] @ [facility] has had a fusion L2 - L3  ?Bilateral L2-3 laminotomy/foraminotomies/medial facetectomy to decompress the bilateral L2 and L3 nerve roots ? ?  ?Problem List Items Addressed This Visit   ? ?  ? Cardiovascular and Mediastinum  ? Essential hypertension - Primary  ? Relevant Medications  ? amLODipine (NORVASC) 5 MG tablet  ?  ? Musculoskeletal and Integument  ? Rheumatoid arthritis ([place])  ?  ? Other  ? Hyperlipidemia  ? Relevant Medications  ? amLODipine (NORVASC) 5 MG tablet  ?  ? ?No orders of the defined types were placed in this encounter. ?  ? ?No orders of the defined types were placed in this encounter. ?  ? ?Follow  up plan: ?Return in about 6 months (around 02/22/2022). ?

## 2021-10-30 DIAGNOSIS — N183 Chronic kidney disease, stage 3 unspecified: Secondary | ICD-10-CM | POA: Diagnosis not present

## 2021-10-30 DIAGNOSIS — I10 Essential (primary) hypertension: Secondary | ICD-10-CM | POA: Diagnosis not present

## 2021-10-30 DIAGNOSIS — E782 Mixed hyperlipidemia: Secondary | ICD-10-CM | POA: Diagnosis not present

## 2021-10-30 DIAGNOSIS — E669 Obesity, unspecified: Secondary | ICD-10-CM | POA: Diagnosis not present

## 2021-10-30 DIAGNOSIS — G4733 Obstructive sleep apnea (adult) (pediatric): Secondary | ICD-10-CM | POA: Diagnosis not present

## 2021-12-15 ENCOUNTER — Ambulatory Visit: Payer: Medicare Other | Attending: Internal Medicine

## 2021-12-15 DIAGNOSIS — G4761 Periodic limb movement disorder: Secondary | ICD-10-CM | POA: Insufficient documentation

## 2021-12-15 DIAGNOSIS — G4733 Obstructive sleep apnea (adult) (pediatric): Secondary | ICD-10-CM | POA: Insufficient documentation

## 2021-12-18 DIAGNOSIS — M0609 Rheumatoid arthritis without rheumatoid factor, multiple sites: Secondary | ICD-10-CM | POA: Diagnosis not present

## 2021-12-18 DIAGNOSIS — Z7960 Long term (current) use of unspecified immunomodulators and immunosuppressants: Secondary | ICD-10-CM | POA: Diagnosis not present

## 2021-12-29 DIAGNOSIS — G4730 Sleep apnea, unspecified: Secondary | ICD-10-CM | POA: Diagnosis not present

## 2022-01-05 ENCOUNTER — Encounter: Payer: Self-pay | Admitting: Internal Medicine

## 2022-01-23 DIAGNOSIS — Z6835 Body mass index (BMI) 35.0-35.9, adult: Secondary | ICD-10-CM | POA: Diagnosis not present

## 2022-01-23 DIAGNOSIS — M4316 Spondylolisthesis, lumbar region: Secondary | ICD-10-CM | POA: Diagnosis not present

## 2022-01-24 DIAGNOSIS — H903 Sensorineural hearing loss, bilateral: Secondary | ICD-10-CM | POA: Diagnosis not present

## 2022-01-24 DIAGNOSIS — R0981 Nasal congestion: Secondary | ICD-10-CM | POA: Diagnosis not present

## 2022-01-24 DIAGNOSIS — H6983 Other specified disorders of Eustachian tube, bilateral: Secondary | ICD-10-CM | POA: Diagnosis not present

## 2022-01-24 DIAGNOSIS — H6123 Impacted cerumen, bilateral: Secondary | ICD-10-CM | POA: Diagnosis not present

## 2022-01-25 DIAGNOSIS — H2513 Age-related nuclear cataract, bilateral: Secondary | ICD-10-CM | POA: Diagnosis not present

## 2022-01-29 ENCOUNTER — Ambulatory Visit: Payer: Self-pay

## 2022-01-29 NOTE — Telephone Encounter (Signed)
  Chief Complaint: sore throat  Symptoms: sore throat, cough, congestion, runny nose, body aches SOB at times  Frequency: since Friday  Pertinent Negatives: NA Disposition: '[]'$ ED /'[]'$ Urgent Care (no appt availability in office) / '[x]'$ Appointment(In office/virtual)/ '[]'$  [facility]/ '[]'$ Home Care/ '[]'$ Refused Recommended Disposition /'[]'$ [place] [facility] [facility]/ '[]'$  Follow-up with PCP Additional Notes: pt is taking OTC meds and feels like managing ok but concerned about wanting to be tested for COVID. He did a home COVID test but was negative and pt has heard they were inaccurate. I scheduled pt for appt tomorrow at 1320 with Santiago Glad, NP.  Reason for Disposition  [1] Sore throat with cough/cold symptoms AND [2] present > 5 days  Answer Assessment - Initial Assessment Questions 1. ONSET: "When did the throat start hurting?" (Hours or days ago)      Friday  2. SEVERITY: "How bad is the sore throat?" (Scale 1-10; mild, moderate or severe)   - MILD (1-3):  Doesn't interfere with eating or normal activities.   - MODERATE (4-7): Interferes with eating some solids and normal activities.   - SEVERE (8-10):  Excruciating pain, interferes with most normal activities.   - SEVERE WITH DYSPHAGIA (10): Can't swallow liquids, drooling.     mild 4.  VIRAL SYMPTOMS: "Are there any symptoms of a cold, such as a runny nose, cough, hoarse voice or red eyes?"      Yes 5. FEVER: "Do you have a fever?" If Yes, ask: "What is your temperature, how was it measured, and when did it start?"     Unsure  7. OTHER SYMPTOMS: "Do you have any other symptoms?" (e.g., difficulty breathing, headache, rash)     Body aches, SOB at night  Protocols used: Sore Throat-A-AH

## 2022-01-30 ENCOUNTER — Ambulatory Visit (INDEPENDENT_AMBULATORY_CARE_PROVIDER_SITE_OTHER): Payer: Medicare Other | Admitting: Nurse Practitioner

## 2022-01-30 ENCOUNTER — Encounter: Payer: Self-pay | Admitting: Nurse Practitioner

## 2022-01-30 VITALS — BP 128/90 | HR 76 | Temp 98.6°F | Wt 236.8 lb

## 2022-01-30 DIAGNOSIS — R6889 Other general symptoms and signs: Secondary | ICD-10-CM | POA: Diagnosis not present

## 2022-01-30 DIAGNOSIS — J069 Acute upper respiratory infection, unspecified: Secondary | ICD-10-CM | POA: Diagnosis not present

## 2022-01-30 LAB — VERITOR FLU A/B WAIVED
Influenza A: NEGATIVE
Influenza B: NEGATIVE

## 2022-01-30 NOTE — Progress Notes (Signed)
Results discussed with patient during visit.

## 2022-01-30 NOTE — Progress Notes (Signed)
BP (!) 128/90   Pulse 76   Temp 98.6 F (37 C) (Oral)   Wt 236 lb 12.8 oz (107.4 kg)   SpO2 98%   BMI 34.95 kg/m    Subjective:    Patient ID: Paul Rodriguez, male    DOB: 09-27-1950, 71 y.o.   MRN: [id number]  HPI: Paul Rodriguez is a 71 y.o. male  Chief Complaint  Patient presents with   Cough    Nasla congestion, runny nose, watery eyes per patient, sinus congestion. Ongoing since Friday. C/o sneezing, HA's. States he had a negative at home covid test.    UPPER RESPIRATORY TRACT INFECTION Worst symptom: symptoms started on Thursday test for COVID was negative. Symptoms are improving since the weekend Fever: no Cough: yes Shortness of breath: yes Wheezing: yes Chest pain: no Chest tightness: no Chest congestion: yes Nasal congestion: yes Runny nose: yes Post nasal drip: yes Sneezing: yes Sore throat: no Swollen glands: no Sinus pressure: yes Headache: yes Face pain: no Toothache: yes Ear pain: no bilateral Ear pressure: no bilateral Eyes red/itching:no Eye drainage/crusting:  watery eyes   Vomiting: no Rash: no Fatigue: yes Sick contacts: no Strep contacts: no  Context: better Recurrent sinusitis: no Relief with OTC cold/cough medications: yes  Treatments attempted:  advil, tylenol and mucinex   Relevant past medical, surgical, family and social history reviewed and updated as indicated. Interim medical history since our last visit reviewed. Allergies and medications reviewed and updated.  Review of Systems  Constitutional:  Positive for fatigue. Negative for fever.  HENT:  Positive for congestion. Negative for ear pain, postnasal drip, rhinorrhea, sinus pressure, sinus pain, sneezing and sore throat.   Respiratory:  Positive for cough and shortness of breath. Negative for chest tightness and wheezing.   Gastrointestinal:  Negative for vomiting.  Skin:  Negative for rash.  Neurological:  Positive for headaches.    Per HPI unless specifically  indicated above     Objective:    BP (!) 128/90   Pulse 76   Temp 98.6 F (37 C) (Oral)   Wt 236 lb 12.8 oz (107.4 kg)   SpO2 98%   BMI 34.95 kg/m   Wt Readings from Last 3 Encounters:  01/30/22 236 lb 12.8 oz (107.4 kg)  08/23/21 235 lb 12.8 oz (107 kg)  03/22/21 230 lb (104.3 kg)    Physical Exam Vitals and nursing note reviewed.  Constitutional:      General: He is not in acute distress.    Appearance: Normal appearance. He is not ill-appearing, toxic-appearing or diaphoretic.  HENT:     Head: Normocephalic.     Right Ear: External ear normal.     Left Ear: External ear normal.     Nose: Congestion present. No rhinorrhea.     Mouth/Throat:     Mouth: Mucous membranes are moist.  Eyes:     General:        Right eye: No discharge.        Left eye: No discharge.     Extraocular Movements: Extraocular movements intact.     Conjunctiva/sclera: Conjunctivae normal.     Pupils: Pupils are equal, round, and reactive to light.  Cardiovascular:     Rate and Rhythm: Normal rate and regular rhythm.     Heart sounds: No murmur heard. Pulmonary:     Effort: Pulmonary effort is normal. No respiratory distress.     Breath sounds: Normal breath sounds. No wheezing, rhonchi or rales.  Abdominal:     General: Abdomen is flat. Bowel sounds are normal.  Musculoskeletal:     Cervical back: Normal range of motion and neck supple.  Skin:    General: Skin is warm and dry.     Capillary Refill: Capillary refill takes less than 2 seconds.  Neurological:     General: No focal deficit present.     Mental Status: He is alert and oriented to person, place, and time.  Psychiatric:        Mood and Affect: Mood normal.        Behavior: Behavior normal.        Thought Content: Thought content normal.        Judgment: Judgment normal.     Results for orders placed or performed in visit on 08/16/21  Comp. Metabolic Panel (14)  Result Value Ref Range   Glucose 97 70 - 99 mg/dL   BUN 14  8 - 27 mg/dL   Creatinine, Ser 1.21 0.76 - 1.27 mg/dL   eGFR 65 >59 mL/min/1.73   Interpretation: Comment    BUN/Creatinine Ratio 12 10 - 24   Sodium 140 134 - 144 mmol/L   Potassium 4.3 3.5 - 5.2 mmol/L   Chloride 104 96 - 106 mmol/L   CO2 21 20 - 29 mmol/L   Calcium 9.6 8.6 - 10.2 mg/dL   Total Protein 6.6 6.0 - 8.5 g/dL   Albumin 4.5 3.8 - 4.8 g/dL   Globulin, Total 2.1 1.5 - 4.5 g/dL   Albumin/Globulin Ratio 2.1 1.2 - 2.2   Bilirubin Total 0.8 0.0 - 1.2 mg/dL   Alkaline Phosphatase 94 44 - 121 IU/L   AST 26 0 - 40 IU/L   ALT 27 0 - 44 IU/L  Lipid panel  Result Value Ref Range   Cholesterol, Total 170 100 - 199 mg/dL   Triglycerides 123 0 - 149 mg/dL   HDL 42 >39 mg/dL   VLDL Cholesterol Cal 22 5 - 40 mg/dL   LDL Chol Calc (NIH) 106 (H) 0 - 99 mg/dL   Chol/HDL Ratio 4.0 0.0 - 5.0 ratio      Assessment & Plan:   Problem List Items Addressed This Visit   None Visit Diagnoses     Upper respiratory virus    -  Primary   Continue with over the counter symptom management. COVID PCR sent out. Flu test negative in office. Follow up if symptoms do not improve.   Flu-like symptoms       Relevant Orders   Novel Coronavirus, NAA ([facility])   Influenza A & B ([facility])        Follow up plan: Return if symptoms worsen or fail to improve.

## 2022-01-31 LAB — NOVEL CORONAVIRUS, NAA: SARS-CoV-2, NAA: NOT DETECTED

## 2022-02-01 NOTE — Progress Notes (Signed)
HI Paul Rodriguez.  Your COVID test was negative. Please let me know if you have any questions.

## 2022-02-12 ENCOUNTER — Ambulatory Visit (INDEPENDENT_AMBULATORY_CARE_PROVIDER_SITE_OTHER): Payer: Medicare Other | Admitting: *Deleted

## 2022-02-12 DIAGNOSIS — Z0000 Encounter for general adult medical examination without abnormal findings: Secondary | ICD-10-CM | POA: Diagnosis not present

## 2022-02-12 NOTE — Progress Notes (Signed)
Subjective:   Paul Rodriguez is a 71 y.o. male who presents for Medicare Annual/Subsequent preventive examination.  I connected with  [name] on 02/12/22 by a telephone enabled telemedicine application and verified that I am speaking with the correct person using two identifiers.   I discussed the limitations of evaluation and management by telemedicine. The patient expressed understanding and agreed to proceed.  Patient location: home  Provider location:  Tele-Health-home    Review of Systems     Cardiac Risk Factors include: advanced age (>51mn, >>83women);male gender;obesity (BMI >30kg/m2);family history of premature cardiovascular disease;hypertension     Objective:    Today's Vitals   There is no height or weight on file to calculate BMI.     03/20/2021   11:28 AM 02/10/2021    9:07 AM 02/01/2020    8:16 AM 01/05/2019    1:12 PM 07/23/2018   10:23 AM 05/02/2017   12:33 PM  Advanced Directives  Does Patient Have a Medical Advance Directive? Yes Yes Yes Yes Yes Yes  Type of AParamedicof ABarbourmeadeLiving will HM[place]Living will HOakvaleLiving will HLevasyLiving will HViolaLiving will HBlue EarthLiving will  Does patient want to make changes to medical advance directive? No - Patient declined    No - Patient declined   Copy of [place]in Chart? No - copy requested No - copy requested No - copy requested No - copy requested No - copy requested     Current Medications (verified) Outpatient Encounter Medications as of 02/12/2022  Medication Sig   acetaminophen (TYLENOL) 325 MG tablet Take 650 mg by mouth every 6 (six) hours as needed for moderate pain.   amLODipine (NORVASC) 5 MG tablet Take 5 mg by mouth daily.   Cholecalciferol (VITAMIN D3) 25 MCG (1000 UT) CAPS Take 1,000 Units by mouth daily.   EPINEPHrine 0.3  mg/0.3 mL IJ SOAJ injection Inject 0.3 mg into the muscle as needed for anaphylaxis.   folic acid (FOLVITE) 1 MG tablet Take 1 mg by mouth daily.   loratadine-pseudoephedrine (CLARITIN-D 12-HOUR) 5-120 MG tablet Take 1 tablet by mouth every 12 (twelve) hours as needed for allergies.   methotrexate (RHEUMATREX) 2.5 MG tablet Take 15 mg by mouth once a week.    Multiple Vitamin (MULTIVITAMIN) tablet Take 1 tablet by mouth daily.   Omega-3 Fatty Acids (FISH OIL) 1000 MG CAPS Take 1,000 mg by mouth daily.   omeprazole (PRILOSEC) 20 MG capsule Take 20 mg by mouth daily.   oxymetazoline (AFRIN) 0.05 % nasal spray Place 1 spray into both nostrils 2 (two) times daily as needed for congestion.   Polyethyl Glycol-Propyl Glycol (SYSTANE OP) Place 1 drop into both eyes daily as needed (dry eyes).   TURMERIC CURCUMIN PO Take 1 capsule by mouth daily.   vitamin B-12 (CYANOCOBALAMIN) 1000 MCG tablet Take 1,000 mcg by mouth daily.   docusate sodium (COLACE) 100 MG capsule Take 1 capsule (100 mg total) by mouth 2 (two) times daily.   fenofibrate (TRICOR) 145 MG tablet Take 1 tablet (145 mg total) by mouth daily.   tiZANidine (ZANAFLEX) 4 MG tablet Take 4 mg by mouth at bedtime as needed for muscle spasms.   valsartan (DIOVAN) 160 MG tablet Take 160 mg by mouth daily.   No facility-administered encounter medications on file as of 02/12/2022.    Allergies (verified) Ultram [tramadol]   History:  Past Medical History:  Diagnosis Date   Allergy    Arthritis    Rheumatoid arthritis   Complication of anesthesia    2014-Pt reported that BP dropped durign procedure.   Depression    GERD (gastroesophageal reflux disease)    History of kidney stones    Hyperlipidemia    Hypertension    Past Surgical History:  Procedure Laterality Date   COLONOSCOPY WITH PROPOFOL N/A 07/23/2018   Procedure: COLONOSCOPY WITH PROPOFOL;  Surgeon: Lin Landsman, MD;  Location: [facility];  Service: Endoscopy;   Laterality: N/A;  Requests early   EYE SURGERY  Lasik   eyelid surgery Bilateral 06/2020   HIP SURGERY Right 2014   arthroscopic   HIP SURGERY Left 03/2013   arthroscopic   KNEE SURGERY Left 03/2013   NOSE SURGERY     POLYPECTOMY  07/23/2018   Procedure: POLYPECTOMY;  Surgeon: Lin Landsman, MD;  Location: [facility];  Service: Endoscopy;;   Family History  Problem Relation Age of Onset   Hypertension Mother    Hypertension Father    Heart disease Father    Stroke Father    Diabetes Maternal Grandfather    Heart disease Maternal Grandmother    Cancer Paternal Grandmother    Cancer Paternal Grandfather    Social History   Socioeconomic History   Marital status: Married    Spouse name: Not on file   Number of children: Not on file   Years of education: Not on file   Highest education level: Associate degree: academic program  Occupational History   Occupation: retired   Tobacco Use   Smoking status: Former    Packs/day: 1.00    Years: 10.00    Total pack years: 10.00    Types: Cigarettes    Quit date: 05/22/1991    Years since quitting: 30.7   Smokeless tobacco: Never  Vaping Use   Vaping Use: Never used  Substance and Sexual Activity   Alcohol use: Yes    Alcohol/week: 5.0 standard drinks of alcohol    Types: 5 Standard drinks or equivalent per week    Comment: on occasion/socially   Drug use: No   Sexual activity: Yes    Birth control/protection: None  Other Topics Concern   Not on file  Social History Narrative   Stays active around the house   Social Determinants of Health   Financial Resource Strain: Low Risk  (02/12/2022)   Overall Financial Resource Strain (CARDIA)    Difficulty of Paying Living Expenses: Not hard at all  Food Insecurity: No Food Insecurity (02/12/2022)   Hunger Vital Sign    Worried About Running Out of Food in the Last Year: Never true    Ran Out of Food in the Last Year: Never true  Transportation Needs: No  Transportation Needs (02/12/2022)   PRAPARE - Hydrologist (Medical): No    Lack of Transportation (Non-Medical): No  Physical Activity: Inactive (02/12/2022)   Exercise Vital Sign    Days of Exercise per Week: 0 days    Minutes of Exercise per Session: 0 min  Stress: No Stress Concern Present (02/12/2022)   [place]    Feeling of Stress : Not at all  Social Connections: Moderately Integrated (02/12/2022)   Social Connection and Isolation Panel [NHANES]    Frequency of Communication with Friends and Family: More than three times a week  Frequency of Social Gatherings with Friends and Family: Three times a week    Attends Religious Services: More than 4 times per year    Active Member of [place] or Organizations: No    Attends Archivist Meetings: Never    Marital Status: Married    Tobacco Counseling Counseling given: Not Answered   Clinical Intake:  Pre-visit preparation completed: Yes  Pain : 0-10 Pain Location: Back Pain Descriptors / Indicators: Aching, Constant Pain Onset: More than a month ago Pain Frequency: Constant Pain Relieving Factors: tylenol, ibuprofen  Pain Relieving Factors: tylenol, ibuprofen  Diabetes: No  How often do you need to have someone help you when you read instructions, pamphlets, or other written materials from your doctor or pharmacy?: 1 - Never  Diabetic?  no  Interpreter Needed?: No  Information entered by :: Leroy Kennedy LPN   Activities of Daily Living    02/12/2022    9:27 AM 03/22/2021    9:30 AM  In your present state of health, do you have any difficulty performing the following activities:  Hearing? 1   Vision? 0   Difficulty concentrating or making decisions? 0   Dressing or bathing? 0   Doing errands, shopping? 0 0  Preparing Food and eating ? N   Using the Toilet? N   In the past six months, have you accidently  leaked urine? N   Do you have problems with loss of bowel control? N   Managing your Medications? N   Managing your Finances? N   Housekeeping or managing your Housekeeping? N     Patient Care Team: Jon Billings, NP as PCP - General (Nurse Practitioner) Lucilla Lame, MD as Consulting Physician (Gastroenterology) Jac Canavan, MD (Unknown Physician Specialty) Emmaline Kluver., MD (Rheumatology)  Indicate any recent Medical Services you may have received from other than [facility] providers in the past year (date may be approximate).     Assessment:   This is a routine wellness examination for Tariq.  Hearing/Vision screen Hearing Screening - Comments:: Bilateral hearing aids Vision Screening - Comments:: [place] eye Up to date  Dietary issues and exercise activities discussed: Current Exercise Habits: The patient does not participate in regular exercise at present   Goals Addressed               This Visit's Progress     Exercise 3x per week (30 min per time) (pt-stated)   Not on track     Discussed walking for 30 min 3x a week.       Patient Stated   On track     02/01/2020, no goals      Patient Stated   Not on track     02/10/2021, wants to lose 30 pounds      Patient Stated        No goals       Depression Screen    02/12/2022    9:11 AM 01/30/2022    1:24 PM 08/23/2021    9:01 AM 02/22/2021   10:05 AM 02/10/2021    9:09 AM 11/22/2020    9:41 AM 10/11/2020   10:38 AM  PHQ 2/9 Scores  PHQ - 2 Score 0 0 0 0 0 0 0  PHQ- 9 Score 0 1 0    0    Fall Risk    02/12/2022    9:06 AM 01/30/2022    1:24 PM 08/23/2021    9:01 AM 02/22/2021   10:05  AM 02/10/2021    9:08 AM  Fall Risk   Falls in the past year? 0 0 1 0 1  Comment     knocked down by dog  Number falls in past yr: 0 0 0 0 0  Injury with Fall? 0 0 0 0 0  Risk for fall due to :  No Fall Risks No Fall Risks No Fall Risks Medication side effect  Follow up Falls evaluation completed;Education  provided;Falls prevention discussed Falls evaluation completed Falls evaluation completed Falls evaluation completed Falls evaluation completed;Education provided;Falls prevention discussed    FALL RISK PREVENTION PERTAINING TO THE HOME:  Any stairs in or around the home? Yes  If so, are there any without handrails? No  Home free of loose throw rugs in walkways, pet beds, electrical cords, etc? Yes  Adequate lighting in your home to reduce risk of falls? Yes   ASSISTIVE DEVICES UTILIZED TO PREVENT FALLS:  Life alert? No  Use of a cane, walker or w/c? No  Grab bars in the bathroom? No  Shower chair or bench in shower? Yes  Elevated toilet seat or a handicapped toilet? Yes   TIMED UP AND GO:  Was the test performed? No .    Cognitive Function:        02/12/2022    9:08 AM 02/10/2021    9:10 AM 02/01/2020    8:19 AM 02/02/2019   10:38 AM  6CIT Screen  What Year? 0 points 0 points 0 points 0 points  What month? 0 points 0 points 0 points 0 points  What time? 0 points 0 points 0 points 0 points  Count back from 20 0 points 0 points 0 points 0 points  Months in reverse 0 points 0 points 0 points 0 points  Repeat phrase 0 points 0 points 2 points 0 points  Total Score 0 points 0 points 2 points 0 points    Immunizations Immunization History  Administered Date(s) Administered   Fluad Quad(high Dose 65+) 02/02/2019   Influenza, High Dose Seasonal PF 02/26/2018, 01/21/2020   Influenza-Unspecified 02/21/2015, 01/17/2016, 01/26/2020, 01/30/2021   PFIZER Comirnaty(Gray Top)Covid-19 Tri-Sucrose Vaccine 06/14/2019, 07/05/2019   PFIZER(Purple Top)SARS-COV-2 Vaccination 06/14/2019, 07/05/2019, 02/03/2021   Pneumococcal Conjugate-13 02/26/2018   Pneumococcal Polysaccharide-23 02/23/2016   Tdap 12/17/2013   Unspecified SARS-COV-2 Vaccination 09/10/2020   Zoster Recombinat (Shingrix) 03/16/2019, 09/19/2019, 10/20/2019   Zoster, Live 02/23/2016    TDAP status: Up to date  Flu  Vaccine status: Up to date  Pneumococcal vaccine status: Up to date  Covid-19 vaccine status: Information provided on how to obtain vaccines.   Qualifies for Shingles Vaccine? No   Zostavax completed Yes   Shingrix Completed?: Yes  Screening Tests Health Maintenance  Topic Date Due   COVID-19 Vaccine (8 - Pfizer risk series) 03/31/2021   INFLUENZA VACCINE  12/19/2021   Pneumonia Vaccine 11+ Years old (3 - PPSV23 or PCV20) 02/13/2023 (Originally 02/22/2021)   TETANUS/TDAP  12/18/2023   COLONOSCOPY (Pts 45-50yr Insurance coverage will need to be confirmed)  07/22/2025   Hepatitis C Screening  Completed   Zoster Vaccines- Shingrix  Completed   HPV VACCINES  Aged Out    Health Maintenance  Health Maintenance Due  Topic Date Due   COVID-19 Vaccine (8 - Pfizer risk series) 03/31/2021   INFLUENZA VACCINE  12/19/2021    Colonoscopy completed 2020  Lung Cancer Screening: (Low Dose CT Chest recommended if Age 70-80years, 30 pack-year currently smoking OR have quit w/in 15years.) does  not qualify.   Lung Cancer Screening Referral:   Additional Screening:  Hepatitis C Screening: does not qualify; Completed 2017  Vision Screening: Recommended annual ophthalmology exams for early detection of glaucoma and other disorders of the eye. Is the patient up to date with their annual eye exam?  Yes  Who is the provider or what is the name of the office in which the patient attends annual eye exams? [facility] If pt is not established with a provider, would they like to be referred to a provider to establish care? No .   Dental Screening: Recommended annual dental exams for proper oral hygiene  Community Resource Referral / Chronic Care Management: CRR required this visit?  No   CCM required this visit?  No      Plan:     I have personally reviewed and noted the following in the patient's chart:   Medical and social history Use of alcohol, tobacco or illicit drugs  Current  medications and supplements including opioid prescriptions. Patient is not currently taking opioid prescriptions. Functional ability and status Nutritional status Physical activity Advanced directives List of other physicians Hospitalizations, surgeries, and ER visits in previous 12 months Vitals Screenings to include cognitive, depression, and falls Referrals and appointments  In addition, I have reviewed and discussed with patient certain preventive protocols, quality metrics, and best practice recommendations. A written personalized care plan for preventive services as well as general preventive health recommendations were provided to patient.     Leroy Kennedy, LPN   11/02/7090   Nurse Notes:

## 2022-02-12 NOTE — Patient Instructions (Signed)
[name] , Thank you for taking time to come for your Medicare Wellness Visit. I appreciate your ongoing commitment to your health goals. Please review the following plan we discussed and let me know if I can assist you in the future.   Screening recommendations/referrals: Colonoscopy: up to date Recommended yearly ophthalmology/optometry visit for glaucoma screening and checkup Recommended yearly dental visit for hygiene and checkup  Vaccinations: Influenza vaccine: up to date Pneumococcal vaccine: up to date Tdap vaccine: up to date Shingles vaccine: up to date    Advanced directives: Education provided  Conditions/risks identified:   Next appointment: 02-22-2022 @ 2:40  [facility] 70 Years and Older, Male Preventive care refers to lifestyle choices and visits with your health care provider that can promote health and wellness. What does preventive care include? A yearly physical exam. This is also called an annual well check. Dental exams once or twice a year. Routine eye exams. Ask your health care provider how often you should have your eyes checked. Personal lifestyle choices, including: Daily care of your teeth and gums. Regular physical activity. Eating a healthy diet. Avoiding tobacco and drug use. Limiting alcohol use. Practicing safe sex. Taking low doses of aspirin every day. Taking vitamin and mineral supplements as recommended by your health care provider. What happens during an annual well check? The services and screenings done by your health care provider during your annual well check will depend on your age, overall health, lifestyle risk factors, and family history of disease. Counseling  Your health care provider may ask you questions about your: Alcohol use. Tobacco use. Drug use. Emotional well-being. Home and relationship well-being. Sexual activity. Eating habits. History of falls. Memory and ability to understand  (cognition). Work and work Statistician. Screening  You may have the following tests or measurements: Height, weight, and BMI. Blood pressure. Lipid and cholesterol levels. These may be checked every 5 years, or more frequently if you are over 66 years old. Skin check. Lung cancer screening. You may have this screening every year starting at age 77 if you have a 30-pack-year history of smoking and currently smoke or have quit within the past 15 years. Fecal occult blood test (FOBT) of the stool. You may have this test every year starting at age 66. Flexible sigmoidoscopy or colonoscopy. You may have a sigmoidoscopy every 5 years or a colonoscopy every 10 years starting at age 32. Prostate cancer screening. Recommendations will vary depending on your family history and other risks. Hepatitis C blood test. Hepatitis B blood test. Sexually transmitted disease (STD) testing. Diabetes screening. This is done by checking your blood sugar (glucose) after you have not eaten for a while (fasting). You may have this done every 1-3 years. Abdominal aortic aneurysm (AAA) screening. You may need this if you are a current or former smoker. Osteoporosis. You may be screened starting at age 63 if you are at high risk. Talk with your health care provider about your test results, treatment options, and if necessary, the need for more tests. Vaccines  Your health care provider may recommend certain vaccines, such as: Influenza vaccine. This is recommended every year. Tetanus, diphtheria, and acellular pertussis (Tdap, Td) vaccine. You may need a Td booster every 10 years. Zoster vaccine. You may need this after age 67. Pneumococcal 13-valent conjugate (PCV13) vaccine. One dose is recommended after age 24. Pneumococcal polysaccharide (PPSV23) vaccine. One dose is recommended after age 67. Talk to your health care provider about which screenings  and vaccines you need and how often you need them. This  information is not intended to replace advice given to you by your health care provider. Make sure you discuss any questions you have with your health care provider. Document Released: 06/03/2015 Document Revised: 01/25/2016 Document Reviewed: 03/08/2015 Elsevier Interactive Patient Education  2017 [place] Prevention in the Home Falls can cause injuries. They can happen to people of all ages. There are many things you can do to make your home safe and to help prevent falls. What can I do on the outside of my home? Regularly fix the edges of walkways and driveways and fix any cracks. Remove anything that might make you trip as you walk through a door, such as a raised step or threshold. Trim any bushes or trees on the path to your home. Use bright outdoor lighting. Clear any walking paths of anything that might make someone trip, such as rocks or tools. Regularly check to see if handrails are loose or broken. Make sure that both sides of any steps have handrails. Any raised decks and porches should have guardrails on the edges. Have any leaves, snow, or ice cleared regularly. Use sand or salt on walking paths during winter. Clean up any spills in your garage right away. This includes oil or grease spills. What can I do in the bathroom? Use night lights. Install grab bars by the toilet and in the tub and shower. Do not use towel bars as grab bars. Use non-skid mats or decals in the tub or shower. If you need to sit down in the shower, use a plastic, non-slip stool. Keep the floor dry. Clean up any water that spills on the floor as soon as it happens. Remove soap buildup in the tub or shower regularly. Attach bath mats securely with double-sided non-slip rug tape. Do not have throw rugs and other things on the floor that can make you trip. What can I do in the bedroom? Use night lights. Make sure that you have a light by your bed that is easy to reach. Do not use any sheets or  blankets that are too big for your bed. They should not hang down onto the floor. Have a firm chair that has side arms. You can use this for support while you get dressed. Do not have throw rugs and other things on the floor that can make you trip. What can I do in the kitchen? Clean up any spills right away. Avoid walking on wet floors. Keep items that you use a lot in easy-to-reach places. If you need to reach something above you, use a strong step stool that has a grab bar. Keep electrical cords out of the way. Do not use floor polish or wax that makes floors slippery. If you must use wax, use non-skid floor wax. Do not have throw rugs and other things on the floor that can make you trip. What can I do with my stairs? Do not leave any items on the stairs. Make sure that there are handrails on both sides of the stairs and use them. Fix handrails that are broken or loose. Make sure that handrails are as long as the stairways. Check any carpeting to make sure that it is firmly attached to the stairs. Fix any carpet that is loose or worn. Avoid having throw rugs at the top or bottom of the stairs. If you do have throw rugs, attach them to the floor with carpet tape.  Make sure that you have a light switch at the top of the stairs and the bottom of the stairs. If you do not have them, ask someone to add them for you. What else can I do to help prevent falls? Wear shoes that: Do not have high heels. Have rubber bottoms. Are comfortable and fit you well. Are closed at the toe. Do not wear sandals. If you use a stepladder: Make sure that it is fully opened. Do not climb a closed stepladder. Make sure that both sides of the stepladder are locked into place. Ask someone to hold it for you, if possible. Clearly mark and make sure that you can see: Any grab bars or handrails. First and last steps. Where the edge of each step is. Use tools that help you move around (mobility aids) if they are  needed. These include: Canes. Walkers. Scooters. Crutches. Turn on the lights when you go into a dark area. Replace any light bulbs as soon as they burn out. Set up your furniture so you have a clear path. Avoid moving your furniture around. If any of your floors are uneven, fix them. If there are any pets around you, be aware of where they are. Review your medicines with your doctor. Some medicines can make you feel dizzy. This can increase your chance of falling. Ask your doctor what other things that you can do to help prevent falls. This information is not intended to replace advice given to you by your health care provider. Make sure you discuss any questions you have with your health care provider. Document Released: 03/03/2009 Document Revised: 10/13/2015 Document Reviewed: 06/11/2014 Elsevier Interactive Patient Education  2017 [employer].

## 2022-02-14 DIAGNOSIS — D485 Neoplasm of uncertain behavior of skin: Secondary | ICD-10-CM | POA: Diagnosis not present

## 2022-02-14 DIAGNOSIS — D045 Carcinoma in situ of skin of trunk: Secondary | ICD-10-CM | POA: Diagnosis not present

## 2022-02-14 DIAGNOSIS — L570 Actinic keratosis: Secondary | ICD-10-CM | POA: Diagnosis not present

## 2022-02-14 DIAGNOSIS — L02821 Furuncle of head [any part, except face]: Secondary | ICD-10-CM | POA: Diagnosis not present

## 2022-02-19 DIAGNOSIS — D045 Carcinoma in situ of skin of trunk: Secondary | ICD-10-CM | POA: Diagnosis not present

## 2022-02-22 ENCOUNTER — Encounter: Payer: Self-pay | Admitting: Nurse Practitioner

## 2022-02-22 ENCOUNTER — Ambulatory Visit (INDEPENDENT_AMBULATORY_CARE_PROVIDER_SITE_OTHER): Payer: Medicare Other | Admitting: Nurse Practitioner

## 2022-02-22 VITALS — BP 132/82 | HR 70 | Temp 97.9°F | Wt 239.4 lb

## 2022-02-22 DIAGNOSIS — D692 Other nonthrombocytopenic purpura: Secondary | ICD-10-CM | POA: Diagnosis not present

## 2022-02-22 DIAGNOSIS — M06042 Rheumatoid arthritis without rheumatoid factor, left hand: Secondary | ICD-10-CM

## 2022-02-22 DIAGNOSIS — Z23 Encounter for immunization: Secondary | ICD-10-CM

## 2022-02-22 DIAGNOSIS — E559 Vitamin D deficiency, unspecified: Secondary | ICD-10-CM | POA: Diagnosis not present

## 2022-02-22 DIAGNOSIS — I10 Essential (primary) hypertension: Secondary | ICD-10-CM | POA: Diagnosis not present

## 2022-02-22 DIAGNOSIS — N1831 Chronic kidney disease, stage 3a: Secondary | ICD-10-CM

## 2022-02-22 DIAGNOSIS — E7800 Pure hypercholesterolemia, unspecified: Secondary | ICD-10-CM | POA: Diagnosis not present

## 2022-02-22 DIAGNOSIS — M06041 Rheumatoid arthritis without rheumatoid factor, right hand: Secondary | ICD-10-CM | POA: Diagnosis not present

## 2022-02-22 DIAGNOSIS — N400 Enlarged prostate without lower urinary tract symptoms: Secondary | ICD-10-CM | POA: Diagnosis not present

## 2022-02-22 NOTE — Assessment & Plan Note (Signed)
Chronic.  ASCVD risk score is elevated.  Stopped Crestor after recent back surgery.  Has an appt with Cardiology on Monday.  Keep appt and will defer to Cardiology.

## 2022-02-22 NOTE — Assessment & Plan Note (Signed)
Chronic.  Controlled.  Continue with current medication regimen.  Labs ordered today.  Return to clinic in 6 months for reevaluation.  Call sooner if concerns arise.  ? ?

## 2022-02-22 NOTE — Assessment & Plan Note (Signed)
Labs ordered at visit today.  Will make recommendations based on lab results.   

## 2022-02-22 NOTE — Assessment & Plan Note (Addendum)
Chronic, ongoing.  Continue collaboration with rheumatology and current regimen as ordered by them.  CBC and CMP today.

## 2022-02-22 NOTE — Assessment & Plan Note (Signed)
Chronic, stable with BP at goal today and home readings at goal. Continue current medication regimen and adjust as needed.  Recommend he check BP at home at least a few days a week and document.  CMP ordered today.  Refills sent in.  Return in 6 months for follow-up.

## 2022-02-22 NOTE — Progress Notes (Signed)
BP 132/82   Pulse 70   Temp 97.9 F (36.6 C) (Oral)   Wt 239 lb 6.4 oz (108.6 kg)   SpO2 97%   BMI 35.34 kg/m    Subjective:    Patient ID: Paul Rodriguez, male    DOB: 1950-06-23, 71 y.o.   MRN: [id number]  HPI: Paul Rodriguez is a 71 y.o. male  Chief Complaint  Patient presents with   Hypertension   Hyperlipidemia   Diabetes    6 month follow up    HYPERTENSION / HYPERLIPIDEMIA See's Dr. Nehemiah Massed Satisfied with current treatment? yes Duration of hypertension: years BP monitoring frequency: not checking BP range:  BP medication side effects: no Past BP meds: amlodipine and valsartan Duration of hyperlipidemia: years Cholesterol medication side effects: no Cholesterol supplements: none Past cholesterol medications: none Medication compliance: excellent compliance Aspirin: no Recent stressors: no Recurrent headaches: no Visual changes: no Palpitations: no Dyspnea: no Chest pain: no Lower extremity edema: no Dizzy/lightheaded: no Patient had side effects to Statins.   The 10-year ASCVD risk score (Arnett DK, et al., 2019) is: 21.8%   Values used to calculate the score:     Age: 17 years     Sex: Male     Is Non-Hispanic African American: No     Diabetic: No     Tobacco smoker: No     Systolic Blood Pressure: 491 mmHg     Is BP treated: Yes     HDL Cholesterol: 42 mg/dL     Total Cholesterol: 170 mg/dL  CHRONIC KIDNEY DISEASE CKD status: controlled Medications renally dose: yes Previous renal evaluation: no Pneumovax:  Up to Date Influenza Vaccine:  Up to Date   Relevant past medical, surgical, family and social history reviewed and updated as indicated. Interim medical history since our last visit reviewed. Allergies and medications reviewed and updated.  Review of Systems  Eyes:  Negative for visual disturbance.  Respiratory:  Negative for chest tightness and shortness of breath.   Cardiovascular:  Negative for chest pain, palpitations and  leg swelling.  Neurological:  Negative for dizziness, light-headedness and headaches.    Per HPI unless specifically indicated above     Objective:    BP 132/82   Pulse 70   Temp 97.9 F (36.6 C) (Oral)   Wt 239 lb 6.4 oz (108.6 kg)   SpO2 97%   BMI 35.34 kg/m   Wt Readings from Last 3 Encounters:  02/22/22 239 lb 6.4 oz (108.6 kg)  01/30/22 236 lb 12.8 oz (107.4 kg)  08/23/21 235 lb 12.8 oz (107 kg)    Physical Exam Vitals and nursing note reviewed.  Constitutional:      General: He is not in acute distress.    Appearance: Normal appearance. He is obese. He is not ill-appearing, toxic-appearing or diaphoretic.  HENT:     Head: Normocephalic.     Right Ear: External ear normal.     Left Ear: External ear normal.     Nose: Nose normal. No congestion or rhinorrhea.     Mouth/Throat:     Mouth: Mucous membranes are moist.  Eyes:     General:        Right eye: No discharge.        Left eye: No discharge.     Extraocular Movements: Extraocular movements intact.     Conjunctiva/sclera: Conjunctivae normal.     Pupils: Pupils are equal, round, and reactive to light.  Cardiovascular:  Rate and Rhythm: Normal rate and regular rhythm.     Heart sounds: No murmur heard. Pulmonary:     Effort: Pulmonary effort is normal. No respiratory distress.     Breath sounds: Normal breath sounds. No wheezing, rhonchi or rales.  Abdominal:     General: Abdomen is flat. Bowel sounds are normal.  Musculoskeletal:     Cervical back: Normal range of motion and neck supple.  Skin:    General: Skin is warm and dry.     Capillary Refill: Capillary refill takes less than 2 seconds.  Neurological:     General: No focal deficit present.     Mental Status: He is alert and oriented to person, place, and time.  Psychiatric:        Mood and Affect: Mood normal.        Behavior: Behavior normal.        Thought Content: Thought content normal.        Judgment: Judgment normal.     Results  for orders placed or performed in visit on 01/30/22  Novel Coronavirus, NAA ([facility])   Specimen: Nasopharyngeal(NP) swabs in vial transport medium  Result Value Ref Range   SARS-CoV-2, NAA Not Detected Not Detected  Influenza A & B (STAT)  Result Value Ref Range   Influenza A Negative Negative   Influenza B Negative Negative      Assessment & Plan:   Problem List Items Addressed This Visit       Cardiovascular and Mediastinum   Essential hypertension    Chronic, stable with BP at goal today and home readings at goal. Continue current medication regimen and adjust as needed.  Recommend he check BP at home at least a few days a week and document.  CMP ordered today.  Refills sent in.  Return in 6 months for follow-up.      Relevant Orders   Comp Met (CMET)   Purpura senilis ([place]) - Primary    Chronic, ongoing.  Continue collaboration with rheumatology and current regimen as ordered by them.  CBC and CMP today.      Relevant Orders   CBC w/Diff     Musculoskeletal and Integument   Rheumatoid arthritis (HCC)     Genitourinary   BPH (benign prostatic hyperplasia)    PSA ordered today.      Relevant Orders   PSA   CKD (chronic kidney disease) stage 3, GFR 30-59 ml/min (HCC)    Chronic.  Controlled.  Continue with current medication regimen.  Labs ordered today.  Return to clinic in 6 months for reevaluation.  Call sooner if concerns arise.          Other   Hyperlipidemia    Chronic.  ASCVD risk score is elevated.  Stopped Crestor after recent back surgery.  Has an appt with Cardiology on Monday.  Keep appt and will defer to Cardiology.      Relevant Orders   Lipid Profile   Vitamin D deficiency    Labs ordered at visit today.  Will make recommendations based on lab results.        Relevant Orders   Vitamin D (25 hydroxy)   Other Visit Diagnoses     Need for vaccination for Strep pneumoniae       Relevant Orders   Pneumococcal conjugate vaccine 20-valent  (Prevnar 20)   Need for influenza vaccination       Relevant Orders   Flu Vaccine QUAD High Dose(Fluad)  Follow up plan: Return in about 6 months (around 08/24/2022) for HTN, HLD, DM2 FU.

## 2022-02-22 NOTE — Assessment & Plan Note (Signed)
PSA ordered today.

## 2022-02-23 LAB — COMPREHENSIVE METABOLIC PANEL
ALT: 22 IU/L (ref 0–44)
AST: 21 IU/L (ref 0–40)
Albumin/Globulin Ratio: 2.3 — ABNORMAL HIGH (ref 1.2–2.2)
Albumin: 4.6 g/dL (ref 3.9–4.9)
Alkaline Phosphatase: 106 IU/L (ref 44–121)
BUN/Creatinine Ratio: 13 (ref 10–24)
BUN: 14 mg/dL (ref 8–27)
Bilirubin Total: 0.6 mg/dL (ref 0.0–1.2)
CO2: 21 mmol/L (ref 20–29)
Calcium: 8.9 mg/dL (ref 8.6–10.2)
Chloride: 105 mmol/L (ref 96–106)
Creatinine, Ser: 1.1 mg/dL (ref 0.76–1.27)
Globulin, Total: 2 g/dL (ref 1.5–4.5)
Glucose: 102 mg/dL — ABNORMAL HIGH (ref 70–99)
Potassium: 4.1 mmol/L (ref 3.5–5.2)
Sodium: 139 mmol/L (ref 134–144)
Total Protein: 6.6 g/dL (ref 6.0–8.5)
eGFR: 72 mL/min/{1.73_m2} (ref >59)

## 2022-02-23 LAB — CBC WITH DIFFERENTIAL/PLATELET
Basophils Absolute: 0 10*3/uL (ref 0.0–0.2)
Basos: 1 %
EOS (ABSOLUTE): 0.1 10*3/uL (ref 0.0–0.4)
Eos: 2 %
Hematocrit: 42.3 % (ref 37.5–51.0)
Hemoglobin: 14.6 g/dL (ref 13.0–17.7)
Immature Grans (Abs): 0 10*3/uL (ref 0.0–0.1)
Immature Granulocytes: 0 %
Lymphocytes Absolute: 2.4 10*3/uL (ref 0.7–3.1)
Lymphs: 40 %
MCH: 32.7 pg (ref 26.6–33.0)
MCHC: 34.5 g/dL (ref 31.5–35.7)
MCV: 95 fL (ref 79–97)
Monocytes Absolute: 0.5 10*3/uL (ref 0.1–0.9)
Monocytes: 9 %
Neutrophils Absolute: 2.9 10*3/uL (ref 1.4–7.0)
Neutrophils: 48 %
Platelets: 197 10*3/uL (ref 150–450)
RBC: 4.46 x10E6/uL (ref 4.14–5.80)
RDW: 13.9 % (ref 11.6–15.4)
WBC: 6 10*3/uL (ref 3.4–10.8)

## 2022-02-23 LAB — LIPID PANEL
Chol/HDL Ratio: 4 ratio (ref 0.0–5.0)
Cholesterol, Total: 167 mg/dL (ref 100–199)
HDL: 42 mg/dL (ref >39)
LDL Chol Calc (NIH): 86 mg/dL (ref 0–99)
Triglycerides: 231 mg/dL — ABNORMAL HIGH (ref 0–149)
VLDL Cholesterol Cal: 39 mg/dL (ref 5–40)

## 2022-02-23 LAB — PSA: Prostate Specific Ag, Serum: 1 ng/mL (ref 0.0–4.0)

## 2022-02-23 LAB — VITAMIN D 25 HYDROXY (VIT D DEFICIENCY, FRACTURES): Vit D, 25-Hydroxy: 71.8 ng/mL (ref 30.0–100.0)

## 2022-02-23 NOTE — Progress Notes (Signed)
HI Paul Rodriguez.  It was nice to see you yesterday.  Your lab work looks good.  Your PSA is normal.  No concerns at this time. Continue with your current medication regimen.  Follow up as discussed.  Please let me know if you have any questions.

## 2022-02-26 DIAGNOSIS — I10 Essential (primary) hypertension: Secondary | ICD-10-CM | POA: Diagnosis not present

## 2022-02-26 DIAGNOSIS — N183 Chronic kidney disease, stage 3 unspecified: Secondary | ICD-10-CM | POA: Diagnosis not present

## 2022-02-26 DIAGNOSIS — E782 Mixed hyperlipidemia: Secondary | ICD-10-CM | POA: Diagnosis not present

## 2022-04-22 ENCOUNTER — Encounter: Payer: Self-pay | Admitting: Nurse Practitioner

## 2022-04-24 DIAGNOSIS — Z7960 Long term (current) use of unspecified immunomodulators and immunosuppressants: Secondary | ICD-10-CM | POA: Diagnosis not present

## 2022-04-24 DIAGNOSIS — M0609 Rheumatoid arthritis without rheumatoid factor, multiple sites: Secondary | ICD-10-CM | POA: Diagnosis not present

## 2022-04-25 DIAGNOSIS — Z23 Encounter for immunization: Secondary | ICD-10-CM | POA: Diagnosis not present

## 2022-05-11 DIAGNOSIS — D225 Melanocytic nevi of trunk: Secondary | ICD-10-CM | POA: Diagnosis not present

## 2022-05-11 DIAGNOSIS — D0439 Carcinoma in situ of skin of other parts of face: Secondary | ICD-10-CM | POA: Diagnosis not present

## 2022-05-11 DIAGNOSIS — L821 Other seborrheic keratosis: Secondary | ICD-10-CM | POA: Diagnosis not present

## 2022-05-11 DIAGNOSIS — D2261 Melanocytic nevi of right upper limb, including shoulder: Secondary | ICD-10-CM | POA: Diagnosis not present

## 2022-05-11 DIAGNOSIS — D2262 Melanocytic nevi of left upper limb, including shoulder: Secondary | ICD-10-CM | POA: Diagnosis not present

## 2022-05-11 DIAGNOSIS — D485 Neoplasm of uncertain behavior of skin: Secondary | ICD-10-CM | POA: Diagnosis not present

## 2022-05-11 DIAGNOSIS — D2272 Melanocytic nevi of left lower limb, including hip: Secondary | ICD-10-CM | POA: Diagnosis not present

## 2022-05-11 DIAGNOSIS — D2271 Melanocytic nevi of right lower limb, including hip: Secondary | ICD-10-CM | POA: Diagnosis not present

## 2022-07-25 DIAGNOSIS — D0439 Carcinoma in situ of skin of other parts of face: Secondary | ICD-10-CM | POA: Diagnosis not present

## 2022-07-25 DIAGNOSIS — L02821 Furuncle of head [any part, except face]: Secondary | ICD-10-CM | POA: Diagnosis not present

## 2022-07-25 DIAGNOSIS — L0202 Furuncle of face: Secondary | ICD-10-CM | POA: Diagnosis not present

## 2022-08-23 NOTE — Progress Notes (Signed)
BP 137/85   Pulse 68   Temp 98 F (36.7 C) (Oral)   Wt 243 lb 11.2 oz (110.5 kg)   SpO2 98%   BMI 35.97 kg/m    Subjective:    Patient ID: Paul Rodriguez, male    DOB: [birth year], 72 y.o.   MRN: [id number]  HPI: Paul Rodriguez is a 72 y.o. male  Chief Complaint  Patient presents with   Hyperlipidemia   Hypertension   HYPERTENSION / HYPERLIPIDEMIA See's Dr. Gwen Pounds.   Satisfied with current treatment? yes Duration of hypertension: years BP monitoring frequency: not checking BP range:  BP medication side effects: no Past BP meds: amlodipine and valsartan Duration of hyperlipidemia: years Cholesterol medication side effects: no Cholesterol supplements: none Past cholesterol medications: none Medication compliance: excellent compliance Aspirin: no Recent stressors: no Recurrent headaches: no Visual changes: no Palpitations: no Dyspnea: no Chest pain: no Lower extremity edema: no Dizzy/lightheaded: no Patient had side effects to Statins.   The 10-year ASCVD risk score (Arnett DK, et al., 2019) is: 23%   Values used to calculate the score:     Age: 15 years     Sex: Male     Is Non-Hispanic African American: No     Diabetic: No     Tobacco smoker: No     Systolic Blood Pressure: 137 mmHg     Is BP treated: Yes     HDL Cholesterol: 42 mg/dL     Total Cholesterol: 167 mg/dL  CHRONIC KIDNEY DISEASE Cr stable at 1.10-1.2.  eGFR- >70-80 CKD status: controlled Medications renally dose: yes Previous renal evaluation: no Pneumovax:  Up to Date Influenza Vaccine:  Up to Date  RHEUMATOLOGY Patient see's Rheumatology for RA.  On Methotrexate and Folic Acid.  Does have a lot of back pain and takes tylenol/Motrin as needed for back pain.  Relevant past medical, surgical, family and social history reviewed and updated as indicated. Interim medical history since our last visit reviewed. Allergies and medications reviewed and updated.  Review of Systems  Eyes:   Negative for visual disturbance.  Respiratory:  Negative for chest tightness and shortness of breath.   Cardiovascular:  Negative for chest pain, palpitations and leg swelling.  Neurological:  Negative for dizziness, light-headedness and headaches.    Per HPI unless specifically indicated above     Objective:    BP 137/85   Pulse 68   Temp 98 F (36.7 C) (Oral)   Wt 243 lb 11.2 oz (110.5 kg)   SpO2 98%   BMI 35.97 kg/m   Wt Readings from Last 3 Encounters:  08/24/22 243 lb 11.2 oz (110.5 kg)  02/22/22 239 lb 6.4 oz (108.6 kg)  01/30/22 236 lb 12.8 oz (107.4 kg)    Physical Exam Vitals and nursing note reviewed.  Constitutional:      General: He is not in acute distress.    Appearance: Normal appearance. He is obese. He is not ill-appearing, toxic-appearing or diaphoretic.  HENT:     Head: Normocephalic.     Right Ear: External ear normal.     Left Ear: External ear normal.     Nose: Nose normal. No congestion or rhinorrhea.     Mouth/Throat:     Mouth: Mucous membranes are moist.  Eyes:     General:        Right eye: No discharge.        Left eye: No discharge.     Extraocular Movements: Extraocular  movements intact.     Conjunctiva/sclera: Conjunctivae normal.     Pupils: Pupils are equal, round, and reactive to light.  Cardiovascular:     Rate and Rhythm: Normal rate and regular rhythm.     Heart sounds: No murmur heard. Pulmonary:     Effort: Pulmonary effort is normal. No respiratory distress.     Breath sounds: Normal breath sounds. No wheezing, rhonchi or rales.  Abdominal:     General: Abdomen is flat. Bowel sounds are normal.  Musculoskeletal:     Cervical back: Normal range of motion and neck supple.  Skin:    General: Skin is warm and dry.     Capillary Refill: Capillary refill takes less than 2 seconds.  Neurological:     General: No focal deficit present.     Mental Status: He is alert and oriented to person, place, and time.  Psychiatric:         Mood and Affect: Mood normal.        Behavior: Behavior normal.        Thought Content: Thought content normal.        Judgment: Judgment normal.     Results for orders placed or performed in visit on 02/22/22  Comp Met (CMET)  Result Value Ref Range   Glucose 102 (H) 70 - 99 mg/dL   BUN 14 8 - 27 mg/dL   Creatinine, Ser 9.50 0.76 - 1.27 mg/dL   eGFR 72 >93 OI/ZTI/4.58   BUN/Creatinine Ratio 13 10 - 24   Sodium 139 134 - 144 mmol/L   Potassium 4.1 3.5 - 5.2 mmol/L   Chloride 105 96 - 106 mmol/L   CO2 21 20 - 29 mmol/L   Calcium 8.9 8.6 - 10.2 mg/dL   Total Protein 6.6 6.0 - 8.5 g/dL   Albumin 4.6 3.9 - 4.9 g/dL   Globulin, Total 2.0 1.5 - 4.5 g/dL   Albumin/Globulin Ratio 2.3 (H) 1.2 - 2.2   Bilirubin Total 0.6 0.0 - 1.2 mg/dL   Alkaline Phosphatase 106 44 - 121 IU/L   AST 21 0 - 40 IU/L   ALT 22 0 - 44 IU/L  Lipid Profile  Result Value Ref Range   Cholesterol, Total 167 100 - 199 mg/dL   Triglycerides 099 (H) 0 - 149 mg/dL   HDL 42 >83 mg/dL   VLDL Cholesterol Cal 39 5 - 40 mg/dL   LDL Chol Calc (NIH) 86 0 - 99 mg/dL   Chol/HDL Ratio 4.0 0.0 - 5.0 ratio  Vitamin D (25 hydroxy)  Result Value Ref Range   Vit D, 25-Hydroxy 71.8 30.0 - 100.0 ng/mL  PSA  Result Value Ref Range   Prostate Specific Ag, Serum 1.0 0.0 - 4.0 ng/mL  CBC w/Diff  Result Value Ref Range   WBC 6.0 3.4 - 10.8 x10E3/uL   RBC 4.46 4.14 - 5.80 x10E6/uL   Hemoglobin 14.6 13.0 - 17.7 g/dL   Hematocrit 38.2 50.5 - 51.0 %   MCV 95 79 - 97 fL   MCH 32.7 26.6 - 33.0 pg   MCHC 34.5 31.5 - 35.7 g/dL   RDW 39.7 67.3 - 41.9 %   Platelets 197 150 - 450 x10E3/uL   Neutrophils 48 Not Estab. %   Lymphs 40 Not Estab. %   Monocytes 9 Not Estab. %   Eos 2 Not Estab. %   Basos 1 Not Estab. %   Neutrophils Absolute 2.9 1.4 - 7.0 x10E3/uL   Lymphocytes Absolute 2.4  0.7 - 3.1 x10E3/uL   Monocytes Absolute 0.5 0.1 - 0.9 x10E3/uL   EOS (ABSOLUTE) 0.1 0.0 - 0.4 x10E3/uL   Basophils Absolute 0.0 0.0 - 0.2  x10E3/uL   Immature Granulocytes 0 Not Estab. %   Immature Grans (Abs) 0.0 0.0 - 0.1 x10E3/uL      Assessment & Plan:   Problem List Items Addressed This Visit       Cardiovascular and Mediastinum   Essential hypertension - Primary    Chronic.  Controlled.  Continue with current medication regimen of Amlodipine and Valsartan.  Labs ordered today.  Return to clinic in 6 months for reevaluation.  Call sooner if concerns arise.        Relevant Orders   Comp Met (CMET)   Purpura senilis    Chronic, ongoing.  Reassured patient during visit today.  CBC and CMP today.        Musculoskeletal and Integument   Rheumatoid arthritis    Chronic.  Controlled.  Continue with current medication regimen on methotrexate.  Labs ordered today.  Return to clinic in 6 months for reevaluation.  Call sooner if concerns arise.        Relevant Orders   Sed Rate (ESR)     Genitourinary   CKD (chronic kidney disease) stage 3, GFR 30-59 ml/min    Chronic.  Controlled.  Continue with current medication regimen.  Labs ordered today.  Return to clinic in 6 months for reevaluation.  Call sooner if concerns arise.        Relevant Orders   Comp Met (CMET)     Other   Hyperlipidemia    Chronic.  Controlled.  Continue with current medication regimen.  Myalgia's with statin therapy.   Labs ordered today.  Return to clinic in 6 months for reevaluation.  Call sooner if concerns arise.        Relevant Orders   Lipid Profile   Vitamin D deficiency    Labs ordered at visit today.  Will make recommendations based on lab results.        Relevant Orders   Vitamin D (25 hydroxy)     Follow up plan: Return in about 6 months (around 02/23/2023) for HTN, HLD, DM2 FU.

## 2022-08-24 ENCOUNTER — Ambulatory Visit (INDEPENDENT_AMBULATORY_CARE_PROVIDER_SITE_OTHER): Payer: Medicare Other | Admitting: Nurse Practitioner

## 2022-08-24 ENCOUNTER — Encounter: Payer: Self-pay | Admitting: Nurse Practitioner

## 2022-08-24 VITALS — BP 137/85 | HR 68 | Temp 98.0°F | Wt 243.7 lb

## 2022-08-24 DIAGNOSIS — I10 Essential (primary) hypertension: Secondary | ICD-10-CM | POA: Diagnosis not present

## 2022-08-24 DIAGNOSIS — D692 Other nonthrombocytopenic purpura: Secondary | ICD-10-CM

## 2022-08-24 DIAGNOSIS — E559 Vitamin D deficiency, unspecified: Secondary | ICD-10-CM | POA: Diagnosis not present

## 2022-08-24 DIAGNOSIS — N1831 Chronic kidney disease, stage 3a: Secondary | ICD-10-CM | POA: Diagnosis not present

## 2022-08-24 DIAGNOSIS — E7800 Pure hypercholesterolemia, unspecified: Secondary | ICD-10-CM | POA: Diagnosis not present

## 2022-08-24 DIAGNOSIS — M06041 Rheumatoid arthritis without rheumatoid factor, right hand: Secondary | ICD-10-CM | POA: Diagnosis not present

## 2022-08-24 DIAGNOSIS — M06042 Rheumatoid arthritis without rheumatoid factor, left hand: Secondary | ICD-10-CM | POA: Diagnosis not present

## 2022-08-24 NOTE — Assessment & Plan Note (Signed)
Chronic, ongoing.  Reassured patient during visit today.  CBC and CMP today.

## 2022-08-24 NOTE — Assessment & Plan Note (Signed)
Chronic.  Controlled.  Continue with current medication regimen of Amlodipine and Valsartan.  Labs ordered today.  Return to clinic in 6 months for reevaluation.  Call sooner if concerns arise.

## 2022-08-24 NOTE — Assessment & Plan Note (Signed)
Chronic.  Controlled.  Continue with current medication regimen on methotrexate.  Labs ordered today.  Return to clinic in 6 months for reevaluation.  Call sooner if concerns arise.

## 2022-08-24 NOTE — Assessment & Plan Note (Signed)
Labs ordered at visit today.  Will make recommendations based on lab results.   

## 2022-08-24 NOTE — Assessment & Plan Note (Signed)
Chronic.  Controlled.  Continue with current medication regimen.  Myalgia's with statin therapy.   Labs ordered today.  Return to clinic in 6 months for reevaluation.  Call sooner if concerns arise.

## 2022-08-24 NOTE — Assessment & Plan Note (Signed)
Chronic.  Controlled.  Continue with current medication regimen.  Labs ordered today.  Return to clinic in 6 months for reevaluation.  Call sooner if concerns arise.  ? ?

## 2022-08-25 LAB — COMPREHENSIVE METABOLIC PANEL
ALT: 27 IU/L (ref 0–44)
AST: 24 IU/L (ref 0–40)
Albumin/Globulin Ratio: 1.7 (ref 1.2–2.2)
Albumin: 4.4 g/dL (ref 3.9–4.9)
Alkaline Phosphatase: 97 IU/L (ref 44–121)
BUN/Creatinine Ratio: 13 (ref 10–24)
BUN: 14 mg/dL (ref 8–27)
Bilirubin Total: 0.5 mg/dL (ref 0.0–1.2)
CO2: 23 mmol/L (ref 20–29)
Calcium: 9.2 mg/dL (ref 8.6–10.2)
Chloride: 105 mmol/L (ref 96–106)
Creatinine, Ser: 1.05 mg/dL (ref 0.76–1.27)
Globulin, Total: 2.6 g/dL (ref 1.5–4.5)
Glucose: 99 mg/dL (ref 70–99)
Potassium: 4.3 mmol/L (ref 3.5–5.2)
Sodium: 141 mmol/L (ref 134–144)
Total Protein: 7 g/dL (ref 6.0–8.5)
eGFR: 76 mL/min/{1.73_m2} (ref >59)

## 2022-08-25 LAB — VITAMIN D 25 HYDROXY (VIT D DEFICIENCY, FRACTURES): Vit D, 25-Hydroxy: 53.3 ng/mL (ref 30.0–100.0)

## 2022-08-25 LAB — LIPID PANEL
Chol/HDL Ratio: 4.4 ratio (ref 0.0–5.0)
Cholesterol, Total: 171 mg/dL (ref 100–199)
HDL: 39 mg/dL — ABNORMAL LOW (ref >39)
LDL Chol Calc (NIH): 94 mg/dL (ref 0–99)
Triglycerides: 221 mg/dL — ABNORMAL HIGH (ref 0–149)
VLDL Cholesterol Cal: 38 mg/dL (ref 5–40)

## 2022-08-25 LAB — SEDIMENTATION RATE: Sed Rate: 2 mm/hr (ref 0–30)

## 2022-08-27 DIAGNOSIS — Z7960 Long term (current) use of unspecified immunomodulators and immunosuppressants: Secondary | ICD-10-CM | POA: Diagnosis not present

## 2022-08-27 DIAGNOSIS — M0609 Rheumatoid arthritis without rheumatoid factor, multiple sites: Secondary | ICD-10-CM | POA: Diagnosis not present

## 2022-08-27 NOTE — Progress Notes (Signed)
Hi Mr. Bertagnolli. It was nice to see you last week.  Your lab work looks good.  No concerns at this time. Continue with your current medication regimen.  Follow up as discussed.  Please let me know if you have any questions.

## 2022-09-12 DIAGNOSIS — M1711 Unilateral primary osteoarthritis, right knee: Secondary | ICD-10-CM | POA: Diagnosis not present

## 2022-09-12 DIAGNOSIS — E669 Obesity, unspecified: Secondary | ICD-10-CM | POA: Diagnosis not present

## 2022-09-12 DIAGNOSIS — G8929 Other chronic pain: Secondary | ICD-10-CM | POA: Diagnosis not present

## 2022-09-25 ENCOUNTER — Other Ambulatory Visit: Payer: Self-pay | Admitting: Neurosurgery

## 2022-09-25 DIAGNOSIS — M4316 Spondylolisthesis, lumbar region: Secondary | ICD-10-CM

## 2022-10-02 ENCOUNTER — Ambulatory Visit
Admission: RE | Admit: 2022-10-02 | Discharge: 2022-10-02 | Disposition: A | Discharge status: Home / Self Care | Payer: Medicare Other | Source: Ambulatory Visit | Attending: Neurosurgery | Admitting: Neurosurgery

## 2022-10-02 DIAGNOSIS — M4316 Spondylolisthesis, lumbar region: Secondary | ICD-10-CM

## 2022-10-02 DIAGNOSIS — M545 Low back pain, unspecified: Secondary | ICD-10-CM | POA: Diagnosis not present

## 2022-10-09 DIAGNOSIS — M5416 Radiculopathy, lumbar region: Secondary | ICD-10-CM | POA: Diagnosis not present

## 2022-10-12 DIAGNOSIS — M5416 Radiculopathy, lumbar region: Secondary | ICD-10-CM | POA: Diagnosis not present

## 2022-10-30 ENCOUNTER — Encounter: Payer: Self-pay | Admitting: Family Medicine

## 2022-10-30 ENCOUNTER — Ambulatory Visit (INDEPENDENT_AMBULATORY_CARE_PROVIDER_SITE_OTHER): Payer: Medicare Other | Admitting: Family Medicine

## 2022-10-30 VITALS — BP 137/86 | HR 71 | Temp 97.6°F | Wt 236.2 lb

## 2022-10-30 DIAGNOSIS — R051 Acute cough: Secondary | ICD-10-CM | POA: Diagnosis not present

## 2022-10-30 MED ORDER — PREDNISONE 10 MG PO TABS
10.0000 mg | ORAL_TABLET | Freq: Every day | ORAL | 0 refills | Status: DC
Start: 2022-10-30 — End: 2023-02-25

## 2022-10-30 MED ORDER — BENZONATATE 100 MG PO CAPS
100.0000 mg | ORAL_CAPSULE | Freq: Two times a day (BID) | ORAL | 0 refills | Status: DC | PRN
Start: 2022-10-30 — End: 2022-10-30

## 2022-10-30 MED ORDER — PREDNISONE 10 MG PO TABS
10.0000 mg | ORAL_TABLET | Freq: Every day | ORAL | 0 refills | Status: DC
Start: 2022-10-30 — End: 2022-10-30

## 2022-10-30 MED ORDER — BENZONATATE 100 MG PO CAPS
100.0000 mg | ORAL_CAPSULE | Freq: Two times a day (BID) | ORAL | 0 refills | Status: DC | PRN
Start: 2022-10-30 — End: 2023-02-07

## 2022-10-30 NOTE — Progress Notes (Signed)
BP 137/86   Pulse 71   Temp 97.6 F (36.4 C) (Oral)   Wt 236 lb 3.2 oz (107.1 kg)   SpO2 95%   BMI 34.86 kg/m    Subjective:    Patient ID: Paul Rodriguez, male    DOB: Aug 17, 1950, 72 y.o.   MRN: [id number]  HPI: Paul Rodriguez is a 72 y.o. male  Chief Complaint  Patient presents with   Cough    Pt states that has had a cough for about 3 weeks and can't seem to get rid of it.   COUGH Patient is a non smoker. He admits the cough has gotten better than when it first started, but is lingering around. He is taking Claritin D daily. He has tried Robitussin and Mucinex, which helped a little.  Duration: 3 weeks Circumstances of initial development of cough: unknown Cough severity: moderate  Cough description: productive and non-productive sometimes green mucus. Aggravating factors:  nothing Alleviating factors: mucinex and cough syrup Status:  stable Treatments attempted:  Claritin D daily, Robitussin, Mucinex  Wheezing: yes Shortness of breath: no Chest pain: no Chest tightness:no Nasal congestion: yes Runny nose: no Postnasal drip: no Frequent throat clearing or swallowing: no Hemoptysis: No Fevers: no Night sweats: no Weight loss: no Heartburn: no Recent foreign travel: no Tuberculosis contacts: no   Relevant past medical, surgical, family and social history reviewed and updated as indicated. Interim medical history since our last visit reviewed. Allergies and medications reviewed and updated.  Review of Systems  HENT:  Positive for congestion. Negative for postnasal drip, sinus pressure, sinus pain, sneezing and sore throat.   Respiratory:  Positive for cough and wheezing. Negative for chest tightness, shortness of breath and stridor.   Allergic/Immunologic: Positive for environmental allergies.    Per HPI unless specifically indicated above     Objective:    BP 137/86   Pulse 71   Temp 97.6 F (36.4 C) (Oral)   Wt 236 lb 3.2 oz (107.1 kg)   SpO2  95%   BMI 34.86 kg/m   Wt Readings from Last 3 Encounters:  10/30/22 236 lb 3.2 oz (107.1 kg)  08/24/22 243 lb 11.2 oz (110.5 kg)  02/22/22 239 lb 6.4 oz (108.6 kg)    Physical Exam Vitals and nursing note reviewed.  Constitutional:      General: He is awake. He is not in acute distress.    Appearance: Normal appearance. He is well-developed and well-groomed. He is not ill-appearing.  HENT:     Head: Normocephalic and atraumatic.     Right Ear: Hearing and external ear normal. No drainage.     Left Ear: Hearing and external ear normal. No drainage.     Nose: Congestion present.     Right Turbinates: Swollen.     Left Turbinates: Swollen.     Right Sinus: No maxillary sinus tenderness or frontal sinus tenderness.     Left Sinus: No maxillary sinus tenderness or frontal sinus tenderness.  Eyes:     General: Lids are normal.        Right eye: No discharge.        Left eye: No discharge.     Conjunctiva/sclera: Conjunctivae normal.  Cardiovascular:     Rate and Rhythm: Normal rate and regular rhythm.     Heart sounds: Normal heart sounds, S1 normal and S2 normal. No murmur heard.    No gallop.  Pulmonary:     Effort: Pulmonary effort is normal.  No accessory muscle usage or respiratory distress.     Breath sounds: Normal breath sounds.  Musculoskeletal:        General: Normal range of motion.     Cervical back: Full passive range of motion without pain and normal range of motion.     Right lower leg: No edema.     Left lower leg: No edema.  Skin:    General: Skin is warm and dry.     Capillary Refill: Capillary refill takes less than 2 seconds.  Neurological:     Mental Status: He is alert and oriented to person, place, and time.  Psychiatric:        Attention and Perception: Attention normal.        Mood and Affect: Mood normal.        Speech: Speech normal.        Behavior: Behavior normal. Behavior is cooperative.        Thought Content: Thought content normal.      Results for orders placed or performed in visit on 08/24/22  Comp Met (CMET)  Result Value Ref Range   Glucose 99 70 - 99 mg/dL   BUN 14 8 - 27 mg/dL   Creatinine, Ser 1.61 0.76 - 1.27 mg/dL   eGFR 76 >09 UE/AVW/0.98   BUN/Creatinine Ratio 13 10 - 24   Sodium 141 134 - 144 mmol/L   Potassium 4.3 3.5 - 5.2 mmol/L   Chloride 105 96 - 106 mmol/L   CO2 23 20 - 29 mmol/L   Calcium 9.2 8.6 - 10.2 mg/dL   Total Protein 7.0 6.0 - 8.5 g/dL   Albumin 4.4 3.9 - 4.9 g/dL   Globulin, Total 2.6 1.5 - 4.5 g/dL   Albumin/Globulin Ratio 1.7 1.2 - 2.2   Bilirubin Total 0.5 0.0 - 1.2 mg/dL   Alkaline Phosphatase 97 44 - 121 IU/L   AST 24 0 - 40 IU/L   ALT 27 0 - 44 IU/L  Lipid Profile  Result Value Ref Range   Cholesterol, Total 171 100 - 199 mg/dL   Triglycerides 119 (H) 0 - 149 mg/dL   HDL 39 (L) >14 mg/dL   VLDL Cholesterol Cal 38 5 - 40 mg/dL   LDL Chol Calc (NIH) 94 0 - 99 mg/dL   Chol/HDL Ratio 4.4 0.0 - 5.0 ratio  Vitamin D (25 hydroxy)  Result Value Ref Range   Vit D, 25-Hydroxy 53.3 30.0 - 100.0 ng/mL  Sed Rate (ESR)  Result Value Ref Range   Sed Rate 2 0 - 30 mm/hr      Assessment & Plan:   Problem List Items Addressed This Visit   None Visit Diagnoses     Acute cough    -  Primary   Acute, ongoing. 6 day prednisone taper given along with Tessalon BID PRN and recommend OTC Delsym. F/u as needed. If no improvement will consider chest xray.   Relevant Medications   benzonatate (TESSALON) 100 MG capsule   predniSONE (DELTASONE) 10 MG tablet        Follow up plan: Return if symptoms worsen or fail to improve.

## 2022-10-30 NOTE — Patient Instructions (Addendum)
Try OTC Delysm and Tessalon for cough Prednisone for 6 days

## 2022-11-13 DIAGNOSIS — M5416 Radiculopathy, lumbar region: Secondary | ICD-10-CM | POA: Diagnosis not present

## 2022-11-23 DIAGNOSIS — D2262 Melanocytic nevi of left upper limb, including shoulder: Secondary | ICD-10-CM | POA: Diagnosis not present

## 2022-11-23 DIAGNOSIS — L02821 Furuncle of head [any part, except face]: Secondary | ICD-10-CM | POA: Diagnosis not present

## 2022-11-23 DIAGNOSIS — Z08 Encounter for follow-up examination after completed treatment for malignant neoplasm: Secondary | ICD-10-CM | POA: Diagnosis not present

## 2022-11-23 DIAGNOSIS — L814 Other melanin hyperpigmentation: Secondary | ICD-10-CM | POA: Diagnosis not present

## 2022-11-23 DIAGNOSIS — D2271 Melanocytic nevi of right lower limb, including hip: Secondary | ICD-10-CM | POA: Diagnosis not present

## 2022-11-23 DIAGNOSIS — L570 Actinic keratosis: Secondary | ICD-10-CM | POA: Diagnosis not present

## 2022-11-23 DIAGNOSIS — Z85828 Personal history of other malignant neoplasm of skin: Secondary | ICD-10-CM | POA: Diagnosis not present

## 2022-11-23 DIAGNOSIS — D692 Other nonthrombocytopenic purpura: Secondary | ICD-10-CM | POA: Diagnosis not present

## 2022-11-23 DIAGNOSIS — D2272 Melanocytic nevi of left lower limb, including hip: Secondary | ICD-10-CM | POA: Diagnosis not present

## 2022-11-23 DIAGNOSIS — D225 Melanocytic nevi of trunk: Secondary | ICD-10-CM | POA: Diagnosis not present

## 2022-11-23 DIAGNOSIS — L821 Other seborrheic keratosis: Secondary | ICD-10-CM | POA: Diagnosis not present

## 2022-11-23 DIAGNOSIS — D2261 Melanocytic nevi of right upper limb, including shoulder: Secondary | ICD-10-CM | POA: Diagnosis not present

## 2022-12-20 DIAGNOSIS — M5416 Radiculopathy, lumbar region: Secondary | ICD-10-CM | POA: Diagnosis not present

## 2022-12-24 DIAGNOSIS — Z23 Encounter for immunization: Secondary | ICD-10-CM | POA: Diagnosis not present

## 2022-12-27 DIAGNOSIS — Z7960 Long term (current) use of unspecified immunomodulators and immunosuppressants: Secondary | ICD-10-CM | POA: Diagnosis not present

## 2022-12-27 DIAGNOSIS — M0609 Rheumatoid arthritis without rheumatoid factor, multiple sites: Secondary | ICD-10-CM | POA: Diagnosis not present

## 2023-01-15 DIAGNOSIS — M47816 Spondylosis without myelopathy or radiculopathy, lumbar region: Secondary | ICD-10-CM | POA: Diagnosis not present

## 2023-01-17 DIAGNOSIS — Z23 Encounter for immunization: Secondary | ICD-10-CM | POA: Diagnosis not present

## 2023-02-07 ENCOUNTER — Ambulatory Visit (INDEPENDENT_AMBULATORY_CARE_PROVIDER_SITE_OTHER): Payer: Medicare Other | Admitting: Family Medicine

## 2023-02-07 VITALS — BP 128/84 | HR 81 | Temp 98.1°F | Ht 69.29 in | Wt 238.8 lb

## 2023-02-07 DIAGNOSIS — J069 Acute upper respiratory infection, unspecified: Secondary | ICD-10-CM | POA: Diagnosis not present

## 2023-02-07 DIAGNOSIS — J22 Unspecified acute lower respiratory infection: Secondary | ICD-10-CM | POA: Insufficient documentation

## 2023-02-07 MED ORDER — BENZONATATE 100 MG PO CAPS
100.0000 mg | ORAL_CAPSULE | Freq: Two times a day (BID) | ORAL | 0 refills | Status: DC | PRN
Start: 2023-02-07 — End: 2023-02-25

## 2023-02-07 NOTE — Assessment & Plan Note (Signed)
Acute, stable. Tessalon prescribed. Recommend Coricidin HBP for symptoms, rest, water hydration, and warm liquids. Return if symptoms worsen or fail to improve.

## 2023-02-07 NOTE — Patient Instructions (Addendum)
Based on your described symptoms and the duration of symptoms it is likely that you have a viral upper respiratory infection (often called a "cold")  Symptoms can last for 3-10 days with lingering cough and intermittent symptoms lasting weeks after that.  The goal of treatment at this time is to reduce your symptoms and discomfort   I have sent in Tessalon pearls for you to take twice per day to help with your cough  You can use over the counter medications such as Coricidin HBP to manage your symptoms rather than those medications mentioned above.   Stay well hydrated (7 water bottles daily), warm liquids such as hot tea and hot soup  If your symptoms do not improve or become worse in the next 5-7 days please make an apt at the office so we can see you  Go to the ER if you begin to have more serious symptoms such as shortness of breath, trouble breathing, loss of consciousness, swelling around the eyes, high fever, severe lasting headaches, vision changes or neck pain/stiffness.

## 2023-02-07 NOTE — Progress Notes (Signed)
BP 128/84   Pulse 81   Temp 98.1 F (36.7 C) (Oral)   Ht 5' 9.29" (1.76 m)   Wt 238 lb 12.8 oz (108.3 kg)   SpO2 98%   BMI 34.97 kg/m    Subjective:    Patient ID: Paul Rodriguez, male    DOB: 20-Feb-1951, 72 y.o.   MRN: [id number]  HPI: Paul Rodriguez is a 72 y.o. male  Chief Complaint  Patient presents with   URI   UPPER RESPIRATORY TRACT INFECTION Started 4 days ago after recent travel to the beach, admits he is starting to feel better. At home COVID test negative. Denies issues eating or keeping food down. Continues to take Claritin daily.   Worst symptom:Congestion Fever: no Cough: Yes, wet and productive Shortness of breath: None Wheezing: no Chest pain: yes, with cough Chest tightness: no Chest congestion: no Nasal congestion: Yes Runny nose: Yes Post nasal drip: no Sneezing: Yes but not alot Sore throat: No Swollen glands: no Sinus pressure: no Headache: Yes Face pain: no Toothache: no Ear pain: no bilateral Ear pressure: no bilateral Eyes red/itching:no Eye drainage/crusting: no  Vomiting: no Rash: no Fatigue: no Sick contacts: Unknown Strep contacts: no  Context: better but staying the same Recurrent sinusitis: no Relief with OTC cold/cough medications: No Treatments attempted: Mucinex  Relevant past medical, surgical, family and social history reviewed and updated as indicated. Interim medical history since our last visit reviewed. Allergies and medications reviewed and updated.  Review of Systems  Constitutional:  Negative for chills, fatigue and fever.  HENT:  Positive for congestion, rhinorrhea and sneezing. Negative for ear pain, postnasal drip, sinus pressure, sinus pain and sore throat.   Eyes:  Negative for discharge, redness and itching.  Respiratory:  Positive for cough. Negative for chest tightness, shortness of breath and wheezing.   Cardiovascular:  Negative for chest pain.  Gastrointestinal:  Negative for vomiting.  Skin:   Negative for rash.  Neurological:  Negative for headaches.    Per HPI unless specifically indicated above     Objective:    BP 128/84   Pulse 81   Temp 98.1 F (36.7 C) (Oral)   Ht 5' 9.29" (1.76 m)   Wt 238 lb 12.8 oz (108.3 kg)   SpO2 98%   BMI 34.97 kg/m   Wt Readings from Last 3 Encounters:  02/07/23 238 lb 12.8 oz (108.3 kg)  10/30/22 236 lb 3.2 oz (107.1 kg)  08/24/22 243 lb 11.2 oz (110.5 kg)    Physical Exam Vitals and nursing note reviewed.  Constitutional:      General: He is awake. He is not in acute distress.    Appearance: Normal appearance. He is well-developed and well-groomed. He is ill-appearing. He is not toxic-appearing or diaphoretic.  HENT:     Head: Normocephalic and atraumatic.     Right Ear: Hearing, tympanic membrane, ear canal and external ear normal. No drainage. There is no impacted cerumen.     Left Ear: Hearing, tympanic membrane, ear canal and external ear normal. No drainage. There is no impacted cerumen.     Nose: Congestion and rhinorrhea present. No nasal tenderness.     Right Turbinates: Swollen. Not pale.     Left Turbinates: Swollen. Not pale.     Right Sinus: No maxillary sinus tenderness or frontal sinus tenderness.     Left Sinus: No maxillary sinus tenderness or frontal sinus tenderness.     Mouth/Throat:     Mouth:  Mucous membranes are moist.     Pharynx: Oropharynx is clear. No oropharyngeal exudate, posterior oropharyngeal erythema or postnasal drip.  Eyes:     General: Lids are normal. No scleral icterus.       Right eye: No discharge.        Left eye: No discharge.     Extraocular Movements: Extraocular movements intact.     Conjunctiva/sclera: Conjunctivae normal.     Pupils: Pupils are equal, round, and reactive to light.  Neck:     Vascular: No carotid bruit.  Cardiovascular:     Rate and Rhythm: Normal rate and regular rhythm.     Pulses: Normal pulses.     Heart sounds: Normal heart sounds, S1 normal and S2  normal. No murmur heard.    No friction rub. No gallop.  Pulmonary:     Effort: Pulmonary effort is normal. No accessory muscle usage or respiratory distress.     Breath sounds: Normal breath sounds. No stridor. No wheezing, rhonchi or rales.  Chest:     Chest wall: No tenderness.  Musculoskeletal:        General: Normal range of motion.     Cervical back: Full passive range of motion without pain, normal range of motion and neck supple. No rigidity. No muscular tenderness.     Right lower leg: No edema.     Left lower leg: No edema.  Lymphadenopathy:     Cervical: No cervical adenopathy.  Skin:    General: Skin is warm and dry.     Capillary Refill: Capillary refill takes less than 2 seconds.     Coloration: Skin is not jaundiced or pale.     Findings: No bruising, erythema, lesion or rash.  Neurological:     General: No focal deficit present.     Mental Status: He is alert and oriented to person, place, and time. Mental status is at baseline.     Cranial Nerves: No cranial nerve deficit.     Sensory: No sensory deficit.     Motor: No weakness.     Coordination: Coordination normal.     Gait: Gait normal.     Deep Tendon Reflexes: Reflexes normal.  Psychiatric:        Attention and Perception: Attention normal.        Mood and Affect: Mood normal.        Speech: Speech normal.        Behavior: Behavior normal. Behavior is cooperative.        Thought Content: Thought content normal.        Judgment: Judgment normal.     Results for orders placed or performed in visit on 08/24/22  Comp Met (CMET)  Result Value Ref Range   Glucose 99 70 - 99 mg/dL   BUN 14 8 - 27 mg/dL   Creatinine, Ser 7.82 0.76 - 1.27 mg/dL   eGFR 76 >95 AO/ZHY/8.65   BUN/Creatinine Ratio 13 10 - 24   Sodium 141 134 - 144 mmol/L   Potassium 4.3 3.5 - 5.2 mmol/L   Chloride 105 96 - 106 mmol/L   CO2 23 20 - 29 mmol/L   Calcium 9.2 8.6 - 10.2 mg/dL   Total Protein 7.0 6.0 - 8.5 g/dL   Albumin 4.4 3.9  - 4.9 g/dL   Globulin, Total 2.6 1.5 - 4.5 g/dL   Albumin/Globulin Ratio 1.7 1.2 - 2.2   Bilirubin Total 0.5 0.0 - 1.2 mg/dL   Alkaline Phosphatase 97  44 - 121 IU/L   AST 24 0 - 40 IU/L   ALT 27 0 - 44 IU/L  Lipid Profile  Result Value Ref Range   Cholesterol, Total 171 100 - 199 mg/dL   Triglycerides 161 (H) 0 - 149 mg/dL   HDL 39 (L) >09 mg/dL   VLDL Cholesterol Cal 38 5 - 40 mg/dL   LDL Chol Calc (NIH) 94 0 - 99 mg/dL   Chol/HDL Ratio 4.4 0.0 - 5.0 ratio  Vitamin D (25 hydroxy)  Result Value Ref Range   Vit D, 25-Hydroxy 53.3 30.0 - 100.0 ng/mL  Sed Rate (ESR)  Result Value Ref Range   Sed Rate 2 0 - 30 mm/hr      Assessment & Plan:   Problem List Items Addressed This Visit     URI, acute - Primary    Acute, stable. Tessalon prescribed. Recommend Coricidin HBP for symptoms, rest, water hydration, and warm liquids. Return if symptoms worsen or fail to improve.       Relevant Medications   benzonatate (TESSALON) 100 MG capsule     Follow up plan: Return if symptoms worsen or fail to improve.

## 2023-02-19 DIAGNOSIS — M48061 Spinal stenosis, lumbar region without neurogenic claudication: Secondary | ICD-10-CM | POA: Diagnosis not present

## 2023-02-19 DIAGNOSIS — M4316 Spondylolisthesis, lumbar region: Secondary | ICD-10-CM | POA: Diagnosis not present

## 2023-02-20 ENCOUNTER — Other Ambulatory Visit: Payer: Self-pay | Admitting: Neurosurgery

## 2023-02-20 DIAGNOSIS — M3501 Sicca syndrome with keratoconjunctivitis: Secondary | ICD-10-CM | POA: Diagnosis not present

## 2023-02-20 DIAGNOSIS — H2513 Age-related nuclear cataract, bilateral: Secondary | ICD-10-CM | POA: Diagnosis not present

## 2023-02-20 DIAGNOSIS — M48061 Spinal stenosis, lumbar region without neurogenic claudication: Secondary | ICD-10-CM

## 2023-02-20 DIAGNOSIS — H04223 Epiphora due to insufficient drainage, bilateral lacrimal glands: Secondary | ICD-10-CM | POA: Diagnosis not present

## 2023-02-25 ENCOUNTER — Ambulatory Visit (INDEPENDENT_AMBULATORY_CARE_PROVIDER_SITE_OTHER): Payer: Medicare Other | Admitting: Nurse Practitioner

## 2023-02-25 ENCOUNTER — Encounter: Payer: Self-pay | Admitting: Nurse Practitioner

## 2023-02-25 VITALS — BP 130/85 | HR 60 | Temp 97.5°F | Wt 242.2 lb

## 2023-02-25 DIAGNOSIS — M06041 Rheumatoid arthritis without rheumatoid factor, right hand: Secondary | ICD-10-CM | POA: Diagnosis not present

## 2023-02-25 DIAGNOSIS — D692 Other nonthrombocytopenic purpura: Secondary | ICD-10-CM | POA: Diagnosis not present

## 2023-02-25 DIAGNOSIS — N1831 Chronic kidney disease, stage 3a: Secondary | ICD-10-CM | POA: Diagnosis not present

## 2023-02-25 DIAGNOSIS — M06042 Rheumatoid arthritis without rheumatoid factor, left hand: Secondary | ICD-10-CM | POA: Diagnosis not present

## 2023-02-25 DIAGNOSIS — I10 Essential (primary) hypertension: Secondary | ICD-10-CM

## 2023-02-25 DIAGNOSIS — E782 Mixed hyperlipidemia: Secondary | ICD-10-CM | POA: Diagnosis not present

## 2023-02-25 DIAGNOSIS — E559 Vitamin D deficiency, unspecified: Secondary | ICD-10-CM | POA: Diagnosis not present

## 2023-02-25 DIAGNOSIS — R0981 Nasal congestion: Secondary | ICD-10-CM | POA: Diagnosis not present

## 2023-02-25 DIAGNOSIS — E7800 Pure hypercholesterolemia, unspecified: Secondary | ICD-10-CM | POA: Diagnosis not present

## 2023-02-25 DIAGNOSIS — N183 Chronic kidney disease, stage 3 unspecified: Secondary | ICD-10-CM | POA: Diagnosis not present

## 2023-02-25 MED ORDER — AMOXICILLIN 500 MG PO CAPS: 500.0000 mg | ORAL_CAPSULE | Freq: Two times a day (BID) | ORAL | 0 refills | Status: DC

## 2023-02-25 MED ORDER — AMOXICILLIN 500 MG PO CAPS: 500.0000 mg | ORAL_CAPSULE | Freq: Two times a day (BID) | ORAL | 0 refills | Status: AC

## 2023-02-25 NOTE — Assessment & Plan Note (Signed)
Labs ordered at visit today.  Will make recommendations based on lab results.   

## 2023-02-25 NOTE — Assessment & Plan Note (Signed)
Chronic.  Controlled.  Continue with current medication regimen.  Labs ordered today.  Return to clinic in 6 months for reevaluation.  Call sooner if concerns arise.  ? ?

## 2023-02-25 NOTE — Progress Notes (Signed)
BP 130/85   Pulse 60   Temp (!) 97.5 F (36.4 C) (Oral)   Wt 242 lb 3.2 oz (109.9 kg)   SpO2 97%   BMI 35.47 kg/m    Subjective:    Patient ID: Paul Rodriguez, male    DOB: Apr 07, 1951, 72 y.o.   MRN: [id number]  HPI: Paul Rodriguez is a 72 y.o. male  Chief Complaint  Patient presents with   Hypertension   Back Pain    Already addressing issue with specialist has MRI scheduled and possibly physical therapy    HYPERTENSION / HYPERLIPIDEMIA See's Dr. Gwen Pounds.   Satisfied with current treatment? yes Duration of hypertension: years BP monitoring frequency: daily BP range: 130/85 BP medication side effects: no Past BP meds: amlodipine and valsartan Duration of hyperlipidemia: years Cholesterol medication side effects: no Cholesterol supplements: none Past cholesterol medications: none Medication compliance: excellent compliance Aspirin: no Recent stressors: no Recurrent headaches: no Visual changes: no Palpitations: no Dyspnea: no Chest pain: no Lower extremity edema: no Dizzy/lightheaded: no Patient had side effects to Statins.   The 10-year ASCVD risk score (Arnett DK, et al., 2019) is: 23.5%   Values used to calculate the score:     Age: 44 years     Sex: Male     Is Non-Hispanic African American: No     Diabetic: No     Tobacco smoker: No     Systolic Blood Pressure: 130 mmHg     Is BP treated: Yes     HDL Cholesterol: 39 mg/dL     Total Cholesterol: 171 mg/dL  CHRONIC KIDNEY DISEASE Cr stable at 1.2.  eGFR- >65 CKD status: controlled Medications renally dose: yes Previous renal evaluation: no Pneumovax:  Up to Date Influenza Vaccine:  Up to Date  RHEUMATOLOGY Patient see's Rheumatology for RA.  On Methotrexate and Folic Acid.  Does have a lot of back pain and takes tylenol/Motrin as needed for back pain. He is still taking Methotrexate.   Patient is having back pain.  He is seeing Dr. Lovell Sheehan Neurosurgeon and is scheduled with an MRI. He wants  patient to do physical therapy.   UPPER RESPIRATORY TRACT INFECTION Worst symptom: symptoms have been ongoing x 3 weeks Fever: no Cough: yes Shortness of breath: no Wheezing: no Chest pain: yes, with cough Chest tightness: no Chest congestion: yes Nasal congestion: yes Runny nose: yes Post nasal drip: yes Sneezing: no Sore throat: no Swollen glands: no Sinus pressure: yes Headache: yes Face pain: no Toothache: no Ear pain: no bilateral Ear pressure: no bilateral Eyes red/itching:no Eye drainage/crusting: no  Vomiting: no Rash: no Fatigue: no Sick contacts: no Strep contacts: no  Context: stable Recurrent sinusitis: no Relief with OTC cold/cough medications: no  Treatments attempted: none   Relevant past medical, surgical, family and social history reviewed and updated as indicated. Interim medical history since our last visit reviewed. Allergies and medications reviewed and updated.  Review of Systems  Constitutional:  Negative for fatigue and fever.  HENT:  Positive for congestion, postnasal drip, rhinorrhea, sinus pressure and sinus pain. Negative for ear pain, sneezing and sore throat.   Eyes:  Negative for visual disturbance.  Respiratory:  Positive for cough. Negative for chest tightness, shortness of breath and wheezing.   Cardiovascular:  Negative for chest pain, palpitations and leg swelling.  Gastrointestinal:  Negative for vomiting.  Musculoskeletal:  Positive for back pain.  Skin:  Negative for rash.  Neurological:  Positive for headaches. Negative  for dizziness and light-headedness.    Per HPI unless specifically indicated above     Objective:    BP 130/85   Pulse 60   Temp (!) 97.5 F (36.4 C) (Oral)   Wt 242 lb 3.2 oz (109.9 kg)   SpO2 97%   BMI 35.47 kg/m   Wt Readings from Last 3 Encounters:  02/25/23 242 lb 3.2 oz (109.9 kg)  02/07/23 238 lb 12.8 oz (108.3 kg)  10/30/22 236 lb 3.2 oz (107.1 kg)    Physical Exam Vitals and nursing  note reviewed.  Constitutional:      General: He is not in acute distress.    Appearance: Normal appearance. He is obese. He is not ill-appearing, toxic-appearing or diaphoretic.  HENT:     Head: Normocephalic.     Right Ear: External ear normal. A middle ear effusion is present.     Left Ear: External ear normal. A middle ear effusion is present.     Nose: Congestion and rhinorrhea present.     Mouth/Throat:     Mouth: Mucous membranes are moist.     Pharynx: Posterior oropharyngeal erythema present.  Eyes:     General:        Right eye: No discharge.        Left eye: No discharge.     Extraocular Movements: Extraocular movements intact.     Conjunctiva/sclera: Conjunctivae normal.     Pupils: Pupils are equal, round, and reactive to light.  Cardiovascular:     Rate and Rhythm: Normal rate and regular rhythm.     Heart sounds: No murmur heard. Pulmonary:     Effort: Pulmonary effort is normal. No respiratory distress.     Breath sounds: Normal breath sounds. No wheezing, rhonchi or rales.  Abdominal:     General: Abdomen is flat. Bowel sounds are normal.  Musculoskeletal:     Cervical back: Normal range of motion and neck supple.  Skin:    General: Skin is warm and dry.     Capillary Refill: Capillary refill takes less than 2 seconds.  Neurological:     General: No focal deficit present.     Mental Status: He is alert and oriented to person, place, and time.  Psychiatric:        Mood and Affect: Mood normal.        Behavior: Behavior normal.        Thought Content: Thought content normal.        Judgment: Judgment normal.     Results for orders placed or performed in visit on 08/24/22  Comp Met (CMET)  Result Value Ref Range   Glucose 99 70 - 99 mg/dL   BUN 14 8 - 27 mg/dL   Creatinine, Ser 0.45 0.76 - 1.27 mg/dL   eGFR 76 >40 JW/JXB/1.47   BUN/Creatinine Ratio 13 10 - 24   Sodium 141 134 - 144 mmol/L   Potassium 4.3 3.5 - 5.2 mmol/L   Chloride 105 96 - 106  mmol/L   CO2 23 20 - 29 mmol/L   Calcium 9.2 8.6 - 10.2 mg/dL   Total Protein 7.0 6.0 - 8.5 g/dL   Albumin 4.4 3.9 - 4.9 g/dL   Globulin, Total 2.6 1.5 - 4.5 g/dL   Albumin/Globulin Ratio 1.7 1.2 - 2.2   Bilirubin Total 0.5 0.0 - 1.2 mg/dL   Alkaline Phosphatase 97 44 - 121 IU/L   AST 24 0 - 40 IU/L   ALT 27 0 -  44 IU/L  Lipid Profile  Result Value Ref Range   Cholesterol, Total 171 100 - 199 mg/dL   Triglycerides 161 (H) 0 - 149 mg/dL   HDL 39 (L) >09 mg/dL   VLDL Cholesterol Cal 38 5 - 40 mg/dL   LDL Chol Calc (NIH) 94 0 - 99 mg/dL   Chol/HDL Ratio 4.4 0.0 - 5.0 ratio  Vitamin D (25 hydroxy)  Result Value Ref Range   Vit D, 25-Hydroxy 53.3 30.0 - 100.0 ng/mL  Sed Rate (ESR)  Result Value Ref Range   Sed Rate 2 0 - 30 mm/hr      Assessment & Plan:   Problem List Items Addressed This Visit       Cardiovascular and Mediastinum   Essential hypertension    Chronic.  Controlled.  Continue with current medication regimen of Amlodipine and Valsartan.  Continue to check blood pressure at home.  Bring log to next visit.  Labs ordered today.  Return to clinic in 6 months for reevaluation.  Call sooner if concerns arise.        Relevant Orders   Comp Met (CMET)   Purpura senilis (HCC) - Primary    Chronic, ongoing.  Reassured patient during visit today.  CBC and CMP today.        Musculoskeletal and Integument   Rheumatoid arthritis (HCC)    Chronic.  Controlled.  Continue with current medication regimen on methotrexate.  Continue to follow up with [facility].  Labs ordered today.  Return to clinic in 6 months for reevaluation.  Call sooner if concerns arise.        Relevant Orders   CBC w/Diff     Genitourinary   CKD (chronic kidney disease) stage 3, GFR 30-59 ml/min (HCC)    Chronic.  Controlled.  Continue with current medication regimen.  Labs ordered today.  Return to clinic in 6 months for reevaluation.  Call sooner if concerns arise.          Other    Hyperlipidemia    Chronic.  Controlled.  Continue with current medication regimen.  Myalgia's with statin therapy.   Labs ordered today.  Return to clinic in 6 months for reevaluation.  Call sooner if concerns arise.        Relevant Orders   Lipid Profile   Vitamin D deficiency    Labs ordered at visit today.  Will make recommendations based on lab results.        Relevant Orders   Vitamin D (25 hydroxy)   Other Visit Diagnoses     Congestion of nasal sinus       Will treat with amoxicillin due to symptoms lasting >3 weeks.  Follow up if not improved.        Follow up plan: Return in about 6 months (around 08/26/2023) for HTN, HLD, DM2 FU.

## 2023-02-25 NOTE — Assessment & Plan Note (Signed)
Chronic.  Controlled.  Continue with current medication regimen on methotrexate.  Continue to follow up with [facility] Rhe[facility].  Labs ordered today.  Return to clinic in 6 months for reevaluation.  Call sooner if concerns arise.

## 2023-02-25 NOTE — Assessment & Plan Note (Signed)
Chronic.  Controlled.  Continue with current medication regimen of Amlodipine and Valsartan.  Continue to check blood pressure at home.  Bring log to next visit.  Labs ordered today.  Return to clinic in 6 months for reevaluation.  Call sooner if concerns arise.

## 2023-02-25 NOTE — Assessment & Plan Note (Signed)
Chronic, ongoing.  Reassured patient during visit today.  CBC and CMP today.

## 2023-02-25 NOTE — Assessment & Plan Note (Signed)
Chronic.  Controlled.  Continue with current medication regimen.  Myalgia's with statin therapy.   Labs ordered today.  Return to clinic in 6 months for reevaluation.  Call sooner if concerns arise.

## 2023-02-26 LAB — CBC WITH DIFFERENTIAL/PLATELET
Basophils Absolute: 0 10*3/uL (ref 0.0–0.2)
Basos: 0 %
EOS (ABSOLUTE): 0.1 10*3/uL (ref 0.0–0.4)
Eos: 1 %
Hematocrit: 43 % (ref 37.5–51.0)
Hemoglobin: 14.7 g/dL (ref 13.0–17.7)
Immature Grans (Abs): 0.1 10*3/uL (ref 0.0–0.1)
Immature Granulocytes: 1 %
Lymphocytes Absolute: 2.3 10*3/uL (ref 0.7–3.1)
Lymphs: 23 %
MCH: 34.3 pg — ABNORMAL HIGH (ref 26.6–33.0)
MCHC: 34.2 g/dL (ref 31.5–35.7)
MCV: 100 fL — ABNORMAL HIGH (ref 79–97)
Monocytes Absolute: 1 10*3/uL — ABNORMAL HIGH (ref 0.1–0.9)
Monocytes: 9 %
Neutrophils Absolute: 6.8 10*3/uL (ref 1.4–7.0)
Neutrophils: 66 %
Platelets: 263 10*3/uL (ref 150–450)
RBC: 4.29 x10E6/uL (ref 4.14–5.80)
RDW: 13.4 % (ref 11.6–15.4)
WBC: 10.2 10*3/uL (ref 3.4–10.8)

## 2023-02-26 LAB — COMPREHENSIVE METABOLIC PANEL
ALT: 24 [IU]/L (ref 0–44)
AST: 16 [IU]/L (ref 0–40)
Albumin: 4.6 g/dL (ref 3.8–4.8)
Alkaline Phosphatase: 65 [IU]/L (ref 44–121)
BUN/Creatinine Ratio: 19 (ref 10–24)
BUN: 22 mg/dL (ref 8–27)
Bilirubin Total: 0.4 mg/dL (ref 0.0–1.2)
CO2: 22 mmol/L (ref 20–29)
Calcium: 9.5 mg/dL (ref 8.6–10.2)
Chloride: 103 mmol/L (ref 96–106)
Creatinine, Ser: 1.17 mg/dL (ref 0.76–1.27)
Globulin, Total: 2.1 g/dL (ref 1.5–4.5)
Glucose: 87 mg/dL (ref 70–99)
Potassium: 4.2 mmol/L (ref 3.5–5.2)
Sodium: 141 mmol/L (ref 134–144)
Total Protein: 6.7 g/dL (ref 6.0–8.5)
eGFR: 67 mL/min/{1.73_m2} (ref >59)

## 2023-02-26 LAB — LIPID PANEL
Chol/HDL Ratio: 2.7 {ratio} (ref 0.0–5.0)
Cholesterol, Total: 189 mg/dL (ref 100–199)
HDL: 71 mg/dL (ref >39)
LDL Chol Calc (NIH): 103 mg/dL — ABNORMAL HIGH (ref 0–99)
Triglycerides: 84 mg/dL (ref 0–149)
VLDL Cholesterol Cal: 15 mg/dL (ref 5–40)

## 2023-02-26 LAB — VITAMIN D 25 HYDROXY (VIT D DEFICIENCY, FRACTURES): Vit D, 25-Hydroxy: 49.9 ng/mL (ref 30.0–100.0)

## 2023-02-26 NOTE — Progress Notes (Signed)
Hi Paul Rodriguez. It was nice to see you yesterday.  Your lab work looks good.  No concerns at this time. Continue with your current medication regimen.  Follow up as discussed.  Please let me know if you have any questions.

## 2023-02-27 DIAGNOSIS — M48061 Spinal stenosis, lumbar region without neurogenic claudication: Secondary | ICD-10-CM | POA: Diagnosis not present

## 2023-02-28 ENCOUNTER — Ambulatory Visit
Admission: RE | Admit: 2023-02-28 | Discharge: 2023-02-28 | Disposition: A | Discharge status: Home / Self Care | Payer: Medicare Other | Source: Ambulatory Visit | Attending: Neurosurgery | Admitting: Neurosurgery

## 2023-02-28 DIAGNOSIS — M48061 Spinal stenosis, lumbar region without neurogenic claudication: Secondary | ICD-10-CM

## 2023-02-28 DIAGNOSIS — M545 Low back pain, unspecified: Secondary | ICD-10-CM | POA: Diagnosis not present

## 2023-02-28 DIAGNOSIS — Z981 Arthrodesis status: Secondary | ICD-10-CM | POA: Diagnosis not present

## 2023-03-01 ENCOUNTER — Other Ambulatory Visit: Payer: Self-pay | Admitting: Neurosurgery

## 2023-03-01 DIAGNOSIS — M4316 Spondylolisthesis, lumbar region: Secondary | ICD-10-CM

## 2023-03-01 DIAGNOSIS — M48061 Spinal stenosis, lumbar region without neurogenic claudication: Secondary | ICD-10-CM | POA: Diagnosis not present

## 2023-03-02 ENCOUNTER — Other Ambulatory Visit: Payer: Medicare Other

## 2023-03-05 ENCOUNTER — Ambulatory Visit: Payer: Medicare Other | Admitting: Emergency Medicine

## 2023-03-05 VITALS — Ht 69.0 in | Wt 235.0 lb

## 2023-03-05 DIAGNOSIS — Z0000 Encounter for general adult medical examination without abnormal findings: Secondary | ICD-10-CM

## 2023-03-05 DIAGNOSIS — M48061 Spinal stenosis, lumbar region without neurogenic claudication: Secondary | ICD-10-CM | POA: Diagnosis not present

## 2023-03-05 NOTE — Progress Notes (Signed)
Subjective:   Paul Rodriguez is a 72 y.o. male who presents for Medicare Annual/Subsequent preventive examination.  Visit Complete: Virtual I connected with  Nuala Alpha on 03/05/23 by a audio enabled telemedicine application and verified that I am speaking with the correct person using two identifiers.  Patient Location: Home  Provider Location: Office/Clinic  I discussed the limitations of evaluation and management by telemedicine. The patient expressed understanding and agreed to proceed.  Vital Signs: Because this visit was a virtual/telehealth visit, some criteria may be missing or patient reported. Any vitals not documented were not able to be obtained and vitals that have been documented are patient reported.   Cardiac Risk Factors include: advanced age (>34men, >49 women);male gender;dyslipidemia;hypertension;obesity (BMI >30kg/m2)     Objective:    Today's Vitals   03/05/23 0947  Weight: 235 lb (106.6 kg)  Height: 5\' 9"  (1.753 m)  PainSc: 3    Body mass index is 34.7 kg/m.     03/05/2023   10:00 AM 03/20/2021   11:28 AM 02/10/2021    9:07 AM 02/01/2020    8:16 AM 01/05/2019    1:12 PM 07/23/2018   10:23 AM 05/02/2017   12:33 PM  Advanced Directives  Does Patient Have a Medical Advance Directive? Yes Yes Yes Yes Yes Yes Yes  Type of Estate agent of [place];Living will Healthcare Power of [place];Living will Healthcare Power of [place];Living will Healthcare Power of [place];Living will Healthcare Power of [place];Living will Healthcare Power of [place];Living will Healthcare Power of [place];Living will  Does patient want to make changes to medical advance directive? No - Patient declined No - Patient declined    No - Patient declined   Copy of Healthcare Power of Attorney in Chart? No - copy requested No - copy requested No - copy requested No - copy requested No - copy requested No - copy requested     Current Medications  (verified) Outpatient Encounter Medications as of 03/05/2023  Medication Sig   acetaminophen (TYLENOL) 325 MG tablet Take 650 mg by mouth every 6 (six) hours as needed for moderate pain.   amLODipine (NORVASC) 5 MG tablet Take 5 mg by mouth daily.   amoxicillin (AMOXIL) 500 MG capsule Take 1 capsule (500 mg total) by mouth 2 (two) times daily for 10 days.   Cholecalciferol (VITAMIN D3) 25 MCG (1000 UT) CAPS Take 1,000 Units by mouth daily.   clindamycin (CLEOCIN T) 1 % external solution Apply 1 Application topically 2 (two) times daily as needed.   folic acid (FOLVITE) 1 MG tablet Take 1 mg by mouth daily.   loratadine-pseudoephedrine (CLARITIN-D 12-HOUR) 5-120 MG tablet Take 1 tablet by mouth every 12 (twelve) hours as needed for allergies.   methotrexate (RHEUMATREX) 2.5 MG tablet Take 15 mg by mouth once a week.    Multiple Vitamin (MULTIVITAMIN) tablet Take 1 tablet by mouth daily.   Omega-3 Fatty Acids (FISH OIL) 1000 MG CAPS Take 1,000 mg by mouth daily.   omeprazole (PRILOSEC) 20 MG capsule Take 20 mg by mouth daily.   oxymetazoline (AFRIN) 0.05 % nasal spray Place 1 spray into both nostrils 2 (two) times daily as needed for congestion.   Polyethyl Glycol-Propyl Glycol (SYSTANE OP) Place 1 drop into both eyes daily as needed (dry eyes).   tiZANidine (ZANAFLEX) 4 MG tablet Take 2 mg by mouth 3 (three) times daily as needed.   TURMERIC CURCUMIN PO Take 1 capsule by mouth daily.   valsartan (DIOVAN)  160 MG tablet Take 160 mg by mouth daily.   vitamin B-12 (CYANOCOBALAMIN) 1000 MCG tablet Take 1,000 mcg by mouth daily.   No facility-administered encounter medications on file as of 03/05/2023.    Allergies (verified) Ultram [tramadol]   History: Past Medical History:  Diagnosis Date   Allergy    Arthritis    Rheumatoid arthritis   Complication of anesthesia    2014-Pt reported that BP dropped durign procedure.   Depression    GERD (gastroesophageal reflux disease)    History of  kidney stones    Hyperlipidemia    Hypertension    Past Surgical History:  Procedure Laterality Date   COLONOSCOPY WITH PROPOFOL N/A 07/23/2018   Procedure: COLONOSCOPY WITH PROPOFOL;  Surgeon: Toney Reil, MD;  Location[facility];  Service: Endoscopy;  Laterality: N/A;  Requests early   EYE SURGERY  Lasik   eyelid surgery Bilateral 06/2020   HIP SURGERY Right 2014   arthroscopic   HIP SURGERY Left 03/2013   arthroscopic   KNEE SURGERY Left 03/2013   NOSE SURGERY     POLYPECTOMY  07/23/2018   Procedure: POLYPECTOMY;  Surgeon: Toney Reil, MD;  Location: [facility];  Service: Endoscopy;;   Family History  Problem Relation Age of Onset   Hypertension Mother    Hypertension Father    Heart disease Father    Stroke Father    Heart disease Maternal Grandmother    Diabetes Maternal Grandfather    Cancer Paternal Grandmother    Cancer Paternal Grandfather    Social History   Socioeconomic History   Marital status: Married    Spouse name: Britta Mccreedy   Number of children: Not on file   Years of education: Not on file   Highest education level: Associate degree: academic program  Occupational History   Occupation: retired   Tobacco Use   Smoking status: Former    Current packs/day: 0.00    Average packs/day: 1 pack/day for 10.0 years (10.0 ttl pk-yrs)    Types: Cigarettes    Start date: 05/21/1981    Quit date: 05/22/1991    Years since quitting: 31.8   Smokeless tobacco: Never  Vaping Use   Vaping status: Never Used  Substance and Sexual Activity   Alcohol use: Not Currently    Alcohol/week: 5.0 standard drinks of alcohol    Types: 5 Glasses of wine per week    Comment: 1 glass of wine 5 days per week   Drug use: No   Sexual activity: Yes    Birth control/protection: None  Other Topics Concern   Not on file  Social History Narrative   Stays active around the house, married 2 step-children   Social Determinants of Health   Financial  Resource Strain: Low Risk  (03/05/2023)   Overall Financial Resource Strain (CARDIA)    Difficulty of Paying Living Expenses: Not hard at all  Food Insecurity: No Food Insecurity (03/05/2023)   Hunger Vital Sign    Worried About Running Out of Food in the Last Year: Never true    Ran Out of Food in the Last Year: Never true  Transportation Needs: No Transportation Needs (03/05/2023)   PRAPARE - Administrator, Civil Service (Medical): No    Lack of Transportation (Non-Medical): No  Physical Activity: Inactive (03/05/2023)   Exercise Vital Sign    Days of Exercise per Week: 0 days    Minutes of Exercise per Session: 0 min  Stress:  No Stress Concern Present (03/05/2023)   [employer] of Occupational Health - Occupational Stress Questionnaire    Feeling of Stress : Not at all  Social Connections: Socially Integrated (03/05/2023)   Social Connection and Isolation Panel [NHANES]    Frequency of Communication with Friends and Family: More than three times a week    Frequency of Social Gatherings with Friends and Family: More than three times a week    Attends Religious Services: More than 4 times per year    Active Member of [employer] or Organizations: Yes    Attends Engineer, structural: More than 4 times per year    Marital Status: Married    Tobacco Counseling Counseling given: Not Answered   Clinical Intake:  Pre-visit preparation completed: Yes  Pain : 0-10 Pain Score: 3  Pain Type: Chronic pain Pain Location: Back Pain Descriptors / Indicators: Aching     BMI - recorded: 34.7 Nutritional Status: BMI > 30  Obese Diabetes: No  How often do you need to have someone help you when you read instructions, pamphlets, or other written materials from your doctor or pharmacy?: 1 - Never  Interpreter Needed?: No  Information entered by :: Tora Kindred, CMA   Activities of Daily Living    03/05/2023    9:50 AM  In your present state of health, do  you have any difficulty performing the following activities:  Hearing? 0  Vision? 0  Difficulty concentrating or making decisions? 0  Walking or climbing stairs? 0  Dressing or bathing? 0  Doing errands, shopping? 0  Preparing Food and eating ? N  Using the Toilet? N  In the past six months, have you accidently leaked urine? N  Do you have problems with loss of bowel control? N  Managing your Medications? N  Managing your Finances? N  Housekeeping or managing your Housekeeping? N    Patient Care Team: Larae Grooms, NP as PCP - General (Nurse Practitioner) Midge Minium, MD as Consulting Physician (Gastroenterology) Ilean Skill, MD (Unknown Physician Specialty) Tressie Stalker, MD as Consulting Physician (Neurosurgery)  Indicate any recent Medical Services you may have received from other than [facility] providers in the past year (date may be approximate).     Assessment:   This is a routine wellness examination for Babatunde.  Hearing/Vision screen Hearing Screening - Comments:: Wears hearing aids Vision Screening - Comments:: Gets routine eye exams   Goals Addressed               This Visit's Progress     Patient Stated (pt-stated)        Back issues to get better and have possible surgery      Depression Screen    03/05/2023    9:58 AM 02/25/2023   10:19 AM 08/24/2022    8:47 AM 02/22/2022    2:43 PM 02/12/2022    9:11 AM 01/30/2022    1:24 PM 08/23/2021    9:01 AM  PHQ 2/9 Scores  PHQ - 2 Score 0 1 0 0 0 0 0  PHQ- 9 Score 0 1 3 1  0 1 0    Fall Risk    03/05/2023   10:01 AM 02/25/2023   10:19 AM 08/24/2022    8:45 AM 02/22/2022    2:43 PM 02/12/2022    9:06 AM  Fall Risk   Falls in the past year? 0 0 0 0 0  Number falls in past yr: 0 0 0 0 0  Injury  with Fall? 0 0 0 0 0  Risk for fall due to : No Fall Risks No Fall Risks No Fall Risks No Fall Risks   Follow up Falls prevention discussed Falls evaluation completed Falls evaluation completed Falls  evaluation completed Falls evaluation completed;Education provided;Falls prevention discussed    MEDICARE RISK AT HOME: Medicare Risk at Home Any stairs in or around the home?: Yes If so, are there any without handrails?: No Home free of loose throw rugs in walkways, pet beds, electrical cords, etc?: Yes Adequate lighting in your home to reduce risk of falls?: Yes Life alert?: No Use of a cane, walker or w/c?: No Grab bars in the bathroom?: No Shower chair or bench in shower?: Yes Elevated toilet seat or a handicapped toilet?: Yes  TIMED UP AND GO:  Was the test performed?  No    Cognitive Function:        03/05/2023   10:02 AM 02/12/2022    9:08 AM 02/10/2021    9:10 AM 02/01/2020    8:19 AM 02/02/2019   10:38 AM  6CIT Screen  What Year? 0 points 0 points 0 points 0 points 0 points  What month? 0 points 0 points 0 points 0 points 0 points  What time? 0 points 0 points 0 points 0 points 0 points  Count back from 20 0 points 0 points 0 points 0 points 0 points  Months in reverse 0 points 0 points 0 points 0 points 0 points  Repeat phrase 0 points 0 points 0 points 2 points 0 points  Total Score 0 points 0 points 0 points 2 points 0 points    Immunizations Immunization History  Administered Date(s) Administered   Fluad Quad(high Dose 65+) 02/02/2019, 02/22/2022   Fluad Trivalent(High Dose 65+) 01/07/2023   Influenza, High Dose Seasonal PF 02/26/2018, 01/21/2020   Influenza-Unspecified 02/21/2015, 01/17/2016, 01/26/2020, 01/30/2021   PFIZER Comirnaty(Gray Top)Covid-19 Tri-Sucrose Vaccine 01/07/2020   PFIZER(Purple Top)SARS-COV-2 Vaccination 06/14/2019, 07/05/2019, 02/03/2021   PNEUMOCOCCAL CONJUGATE-20 02/22/2022   Pneumococcal Conjugate-13 02/26/2018   Pneumococcal Polysaccharide-23 02/23/2016   RSV,unspecified 04/25/2022   Tdap 12/17/2013   Unspecified SARS-COV-2 Vaccination 09/10/2020, 04/25/2022   Zoster Recombinant(Shingrix) 03/16/2019, 09/19/2019, 10/20/2019    Zoster, Live 02/23/2016    TDAP status: Up to date  Flu Vaccine status: Up to date  Pneumococcal vaccine status: Up to date  Covid-19 vaccine status: Information provided on how to obtain vaccines.   Qualifies for Shingles Vaccine? Yes   Zostavax completed No   Shingrix Completed?: Yes  Screening Tests Health Maintenance  Topic Date Due   COVID-19 Vaccine (7 - 2023-24 season) 01/20/2023   DTaP/Tdap/Td (2 - Td or Tdap) 12/18/2023   Medicare Annual Wellness (AWV)  03/04/2024   Colonoscopy  07/22/2025   Pneumonia Vaccine 63+ Years old  Completed   INFLUENZA VACCINE  Completed   Hepatitis C Screening  Completed   Zoster Vaccines- Shingrix  Completed   HPV VACCINES  Aged Out    Health Maintenance  Health Maintenance Due  Topic Date Due   COVID-19 Vaccine (7 - 2023-24 season) 01/20/2023    Colorectal cancer screening: Type of screening: Colonoscopy. Completed 07/23/18. Repeat every 7 years  Lung Cancer Screening: (Low Dose CT Chest recommended if Age 18-80 years, 20 pack-year currently smoking OR have quit w/in 15years.) does not qualify.   Lung Cancer Screening Referral: n/a  Additional Screening:  Hepatitis C Screening: does not qualify; Completed 08/03/15  Vision Screening: Recommended annual ophthalmology exams for early detection  of glaucoma and other disorders of the eye.  Dental Screening: Recommended annual dental exams for proper oral hygiene  Community Resource Referral / Chronic Care Management: CRR required this visit?  No   CCM required this visit?  No     Plan:     I have personally reviewed and noted the following in the patient's chart:   Medical and social history Use of alcohol, tobacco or illicit drugs  Current medications and supplements including opioid prescriptions. Patient is not currently taking opioid prescriptions. Functional ability and status Nutritional status Physical activity Advanced directives List of other  physicians Hospitalizations, surgeries, and ER visits in previous 12 months Vitals Screenings to include cognitive, depression, and falls Referrals and appointments  In addition, I have reviewed and discussed with patient certain preventive protocols, quality metrics, and best practice recommendations. A written personalized care plan for preventive services as well as general preventive health recommendations were provided to patient.     Tora Kindred, CMA   03/05/2023   After Visit Summary: (MyChart) Due to this being a telephonic visit, the after visit summary with patients personalized plan was offered to patient via MyChart   Nurse Notes:  Has had flu and covid vaccines. Will bring documentation of the covid.

## 2023-03-05 NOTE — Patient Instructions (Addendum)
Paul Rodriguez , Thank you for taking time to come for your Medicare Wellness Visit. I appreciate your ongoing commitment to your health goals. Please review the following plan we discussed and let me know if I can assist you in the future.   Referrals/Orders/Follow-Ups/Clinician Recommendations: Bring in documentation where you received the covid booster at your earliest convenience. I hope you get some answers regarding your back pain.  This is a list of the screening recommended for you and due dates:  Health Maintenance  Topic Date Due   COVID-19 Vaccine (9 - 2023-24 season) 01/20/2023   DTaP/Tdap/Td vaccine (2 - Td or Tdap) 12/18/2023   Medicare Annual Wellness Visit  03/04/2024   Colon Cancer Screening  07/22/2025   Pneumonia Vaccine  Completed   Flu Shot  Completed   Hepatitis C Screening  Completed   Zoster (Shingles) Vaccine  Completed   HPV Vaccine  Aged Out    Advanced directives: (Copy Requested) Please bring a copy of your health care power of attorney and living will to the office to be added to your chart at your convenience.  Next Medicare Annual Wellness Visit scheduled for next year: Yes, 03/10/24 @ 9:20am

## 2023-03-06 ENCOUNTER — Ambulatory Visit
Admission: RE | Admit: 2023-03-06 | Discharge: 2023-03-06 | Disposition: A | Discharge status: Home / Self Care | Payer: Medicare Other | Source: Ambulatory Visit | Attending: Neurosurgery | Admitting: Neurosurgery

## 2023-03-06 DIAGNOSIS — M4316 Spondylolisthesis, lumbar region: Secondary | ICD-10-CM | POA: Insufficient documentation

## 2023-03-06 DIAGNOSIS — M4310 Spondylolisthesis, site unspecified: Secondary | ICD-10-CM | POA: Diagnosis not present

## 2023-03-06 DIAGNOSIS — M51379 Other intervertebral disc degeneration, lumbosacral region without mention of lumbar back pain or lower extremity pain: Secondary | ICD-10-CM | POA: Diagnosis not present

## 2023-03-06 DIAGNOSIS — M51369 Other intervertebral disc degeneration, lumbar region without mention of lumbar back pain or lower extremity pain: Secondary | ICD-10-CM | POA: Diagnosis not present

## 2023-03-06 DIAGNOSIS — M48062 Spinal stenosis, lumbar region with neurogenic claudication: Secondary | ICD-10-CM | POA: Diagnosis not present

## 2023-03-07 DIAGNOSIS — M48061 Spinal stenosis, lumbar region without neurogenic claudication: Secondary | ICD-10-CM | POA: Diagnosis not present

## 2023-03-12 DIAGNOSIS — M4316 Spondylolisthesis, lumbar region: Secondary | ICD-10-CM | POA: Diagnosis not present

## 2023-03-12 DIAGNOSIS — M48061 Spinal stenosis, lumbar region without neurogenic claudication: Secondary | ICD-10-CM | POA: Diagnosis not present

## 2023-03-15 DIAGNOSIS — M48061 Spinal stenosis, lumbar region without neurogenic claudication: Secondary | ICD-10-CM | POA: Diagnosis not present

## 2023-03-19 DIAGNOSIS — M48061 Spinal stenosis, lumbar region without neurogenic claudication: Secondary | ICD-10-CM | POA: Diagnosis not present

## 2023-03-20 ENCOUNTER — Telehealth: Payer: Self-pay | Admitting: Nurse Practitioner

## 2023-03-20 DIAGNOSIS — M47816 Spondylosis without myelopathy or radiculopathy, lumbar region: Secondary | ICD-10-CM | POA: Diagnosis not present

## 2023-03-20 MED ORDER — TIZANIDINE HCL 4 MG PO TABS: 4.0000 mg | ORAL_TABLET | Freq: Three times a day (TID) | ORAL | 1 refills | Status: DC | PRN

## 2023-03-20 NOTE — Telephone Encounter (Signed)
Patient's wife states patient needs a refill of medication.

## 2023-03-21 DIAGNOSIS — M48061 Spinal stenosis, lumbar region without neurogenic claudication: Secondary | ICD-10-CM | POA: Diagnosis not present

## 2023-03-26 DIAGNOSIS — M48061 Spinal stenosis, lumbar region without neurogenic claudication: Secondary | ICD-10-CM | POA: Diagnosis not present

## 2023-04-03 DIAGNOSIS — M48061 Spinal stenosis, lumbar region without neurogenic claudication: Secondary | ICD-10-CM | POA: Diagnosis not present

## 2023-04-05 DIAGNOSIS — M48061 Spinal stenosis, lumbar region without neurogenic claudication: Secondary | ICD-10-CM | POA: Diagnosis not present

## 2023-04-09 DIAGNOSIS — M48061 Spinal stenosis, lumbar region without neurogenic claudication: Secondary | ICD-10-CM | POA: Diagnosis not present

## 2023-04-10 ENCOUNTER — Other Ambulatory Visit: Payer: Self-pay

## 2023-04-10 ENCOUNTER — Other Ambulatory Visit: Payer: Self-pay | Admitting: Nurse Practitioner

## 2023-04-10 MED ORDER — VALSARTAN 160 MG PO TABS
160.0000 mg | ORAL_TABLET | Freq: Every day | ORAL | 1 refills | Status: DC
  Filled 2023-04-10: qty 90, 90d supply, fill #0

## 2023-04-12 DIAGNOSIS — M48061 Spinal stenosis, lumbar region without neurogenic claudication: Secondary | ICD-10-CM | POA: Diagnosis not present

## 2023-04-14 ENCOUNTER — Other Ambulatory Visit: Payer: Self-pay | Admitting: Nurse Practitioner

## 2023-04-16 DIAGNOSIS — M48061 Spinal stenosis, lumbar region without neurogenic claudication: Secondary | ICD-10-CM | POA: Diagnosis not present

## 2023-04-16 NOTE — Telephone Encounter (Signed)
Requested medication (s) are due for refill today -no  Requested medication (s) are on the active medication list -yes  Future visit scheduled -yes  Last refill: 03/20/23 #90 1RF  Notes to clinic: non delegated Rx- Pharmacy request- 90 day supply  Requested Prescriptions  Pending Prescriptions Disp Refills   tiZANidine (ZANAFLEX) 4 MG tablet [Pharmacy Med Name: TIZANIDINE HCL 4 MG TABLET] 270 tablet 1    Sig: TAKE 1 TABLET BY MOUTH 3 TIMES DAILY AS NEEDED.     Not Delegated - Cardiovascular:  Alpha-2 Agonists - tizanidine Failed - 04/14/2023 11:30 AM      Failed - This refill cannot be delegated      Passed - Valid encounter within last 6 months    Recent Outpatient Visits           1 month ago Purpura senilis (HCC)   [place] [facility] Larae Grooms, NP   2 months ago URI, acute   [place] [facility], Sherran Needs, NP   5 months ago Acute cough   [place] [facility] [place], Sherran Needs, NP   7 months ago Essential hypertension   [place] [facility] Larae Grooms, NP   1 year ago [facility]ura senilis [facility])   [place] [facility] Larae Grooms, NP       Future Appointments             In 4 months Larae Grooms, NP [place] [facility]               Requested Prescriptions  Pending Prescriptions Disp Refills   tiZANidine (ZANAFLEX) 4 MG tablet [Pharmacy Med Name: TIZANIDINE HCL 4 MG TABLET] 270 tablet 1    Sig: TAKE 1 TABLET BY MOUTH 3 TIMES DAILY AS NEEDED.     Not Delegated - Cardiovascular:  Alpha-2 Agonists - tizanidine Failed - 04/14/2023 11:30 AM      Failed - This refill cannot be delegated      Passed - Valid encounter within last 6 months    Recent Outpatient Visits           1 month ago Purpura senilis [facility])   [place] [facility] Larae Grooms, NP   2 months ago URI, acute   [place]  [facility] Pearley, Sherran Needs, NP   5 months ago Acute cough   [place] [facility] [place], Sherran Needs, NP   7 months ago Essential hypertension   [place] [facility] Larae Grooms, NP   1 year ago Purpura senilis [facility])   [place] [facility] Larae Grooms, NP       Future Appointments             In 4 months Larae Grooms, NP  [facility], [facility]

## 2023-04-23 DIAGNOSIS — M48061 Spinal stenosis, lumbar region without neurogenic claudication: Secondary | ICD-10-CM | POA: Diagnosis not present

## 2023-04-30 DIAGNOSIS — Z7960 Long term (current) use of unspecified immunomodulators and immunosuppressants: Secondary | ICD-10-CM | POA: Diagnosis not present

## 2023-04-30 DIAGNOSIS — M0609 Rheumatoid arthritis without rheumatoid factor, multiple sites: Secondary | ICD-10-CM | POA: Diagnosis not present

## 2023-05-15 ENCOUNTER — Other Ambulatory Visit: Payer: Self-pay | Admitting: Nurse Practitioner

## 2023-05-16 NOTE — Telephone Encounter (Signed)
Requested medication (s) are due for refill today: yes  Requested medication (s) are on the active medication list: yes    Last refill: 03/20/23  #90  1 refill  Future visit scheduled yes 4.7.25  Notes to clinic: Not delegated, please review. Thank you.  Requested Prescriptions  Pending Prescriptions Disp Refills   tiZANidine (ZANAFLEX) 4 MG tablet [Pharmacy Med Name: TIZANIDINE HCL 4 MG TABLET] 90 tablet 1    Sig: TAKE 1 TABLET BY MOUTH 3 TIMES DAILY AS NEEDED.     Not Delegated - Cardiovascular:  Alpha-2 Agonists - tizanidine Failed - 05/16/2023  2:52 PM      Failed - This refill cannot be delegated      Passed - Valid encounter within last 6 months    Recent Outpatient Visits           2 months ago Purpura senilis [facility])   [place] [facility] Larae Grooms, NP   3 months ago URI, acute   [place] [facility], Sherran Needs, NP   6 months ago Acute cough   [facility] [place], Sherran Needs, NP   8 months ago Essential hypertension   [place] [facility] Larae Grooms, NP   1 year ago Purpura senilis [facility])   [place] [facility] Larae Grooms, NP       Future Appointments             In 3 months Larae Grooms, NP [place] [facility], [facility]

## 2023-06-08 ENCOUNTER — Other Ambulatory Visit: Payer: Self-pay | Admitting: Nurse Practitioner

## 2023-06-10 NOTE — Telephone Encounter (Signed)
Requested medication (s) are due for refill today - no  Requested medication (s) are on the active medication list -yes  Future visit scheduled -yes  Last refill: 05/16/23 #90 1RF  Notes to clinic: non delegated Rx- 90 day supply requested  Requested Prescriptions  Pending Prescriptions Disp Refills   tiZANidine (ZANAFLEX) 4 MG tablet [Pharmacy Med Name: TIZANIDINE HCL 4 MG TABLET] 270 tablet 1    Sig: TAKE 1 TABLET BY MOUTH THREE TIMES A DAY AS NEEDED     Not Delegated - Cardiovascular:  Alpha-2 Agonists - tizanidine Failed - 06/10/2023 11:53 AM      Failed - This refill cannot be delegated      Passed - Valid encounter within last 6 months    Recent Outpatient Visits           3 months ago Purpura senilis (HCC)   [place] [facility] Larae Grooms, NP   4 months ago URI, acute   [place] [facility] Pearley, Sherran Needs, NP   7 months ago Acute cough   [place] [facility] [place], Sherran Needs, NP   9 months ago Essential hypertension   [place] [facility] Larae Grooms, NP   1 year ago Purpura senilis [facility])   [place] [facility] Larae Grooms, NP       Future Appointments             In 2 months Larae Grooms, NP [place] [facility], [facility]               Requested Prescriptions  Pending Prescriptions Disp Refills   tiZANidine (ZANAFLEX) 4 MG tablet [Pharmacy Med Name: TIZANIDINE HCL 4 MG TABLET] 270 tablet 1    Sig: TAKE 1 TABLET BY MOUTH THREE TIMES A DAY AS NEEDED     Not Delegated - Cardiovascular:  Alpha-2 Agonists - tizanidine Failed - 06/10/2023 11:53 AM      Failed - This refill cannot be delegated      Passed - Valid encounter within last 6 months    Recent Outpatient Visits           3 months ago Purpura senilis (HCC)   [place] [facility] Larae Grooms, NP   4 months ago URI, acute   [place]  [facility] Pear[facility], Sherran Needs, NP   7 months ago Acute cough   [place] [facility] [place], Sherran Needs, NP   9 months ago Essential hypertension   [place] [facility] Larae Grooms, NP   1 year ago Purpura senilis [facility])   [place] [facility] Larae Grooms, NP       Future Appointments             In 2 months Larae Grooms, NP [place] [facility], [facility]

## 2023-06-11 DIAGNOSIS — M4316 Spondylolisthesis, lumbar region: Secondary | ICD-10-CM | POA: Diagnosis not present

## 2023-06-21 DIAGNOSIS — D2261 Melanocytic nevi of right upper limb, including shoulder: Secondary | ICD-10-CM | POA: Diagnosis not present

## 2023-06-21 DIAGNOSIS — L821 Other seborrheic keratosis: Secondary | ICD-10-CM | POA: Diagnosis not present

## 2023-06-21 DIAGNOSIS — L570 Actinic keratosis: Secondary | ICD-10-CM | POA: Diagnosis not present

## 2023-06-21 DIAGNOSIS — D2271 Melanocytic nevi of right lower limb, including hip: Secondary | ICD-10-CM | POA: Diagnosis not present

## 2023-06-21 DIAGNOSIS — Z85828 Personal history of other malignant neoplasm of skin: Secondary | ICD-10-CM | POA: Diagnosis not present

## 2023-06-21 DIAGNOSIS — D2262 Melanocytic nevi of left upper limb, including shoulder: Secondary | ICD-10-CM | POA: Diagnosis not present

## 2023-06-21 DIAGNOSIS — D2272 Melanocytic nevi of left lower limb, including hip: Secondary | ICD-10-CM | POA: Diagnosis not present

## 2023-07-05 ENCOUNTER — Ambulatory Visit: Payer: Self-pay

## 2023-07-05 NOTE — Telephone Encounter (Signed)
Message from [place] P sent at 07/05/2023  9:18 AM EST  Summary: flu possible   Pt possibly has the flu///[phone]///been sick this week.  No appts please advise.        Ty ad,mu cor   Chief Complaint: Headache/sinus pressure Symptoms: cough, runny nose, sinus congestion,fatigue and weakness, SOB at rest Frequency: Tuesday Pertinent Negatives: Patient denies sore throat  Disposition: [] ED /[x] Urgent Care (no appt availability in office) / [] Appointment(In office/virtual)/ []  [facility]/ [] Home Care/ [] Refused Recommended Disposition /[] [facility]/ []  Follow-up with PCP Additional Notes: no appts  Reason for Disposition  Patient is HIGH RISK (e.g., age > 64 years, pregnant, HIV+, or chronic medical condition)  Answer Assessment - Initial Assessment Questions 1. WORST SYMPTOM: "What is your worst symptom?" (e.g., cough, runny nose, muscle aches, headache, sore throat, fever)      Fatigue, weakness, cough (productive), SOB at rest  2. ONSET: "When did your flu symptoms start?"      Tuesday  3. COUGH: "How bad is the cough?"       Cough frequent 4. RESPIRATORY DISTRESS: "Describe your breathing."      SOB at rest 5. FEVER: "Do you have a fever?" If Yes, ask: "What is your temperature, how was it measured, and when did it start?"     no 6. EXPOSURE: "Were you exposed to someone with influenza?"       no 8. HIGH RISK DISEASE: "Do you have any chronic medical problems?" (e.g., heart or lung disease, asthma, weak immune system, or other HIGH RISK conditions)     RA, age, HTN  10. OTHER SYMPTOMS: "Do you have any other symptoms?"  (e.g., runny nose, muscle aches, headache, sore throat)       Headache, sinus congestion,  Protocols used: Influenza (Flu) - [facility]

## 2023-08-26 ENCOUNTER — Encounter: Payer: Self-pay | Admitting: Nurse Practitioner

## 2023-08-26 ENCOUNTER — Ambulatory Visit (INDEPENDENT_AMBULATORY_CARE_PROVIDER_SITE_OTHER): Payer: Self-pay | Admitting: Nurse Practitioner

## 2023-08-26 VITALS — BP 128/88 | HR 68 | Temp 97.7°F | Wt 244.6 lb

## 2023-08-26 DIAGNOSIS — E7800 Pure hypercholesterolemia, unspecified: Secondary | ICD-10-CM | POA: Diagnosis not present

## 2023-08-26 DIAGNOSIS — E559 Vitamin D deficiency, unspecified: Secondary | ICD-10-CM

## 2023-08-26 DIAGNOSIS — N1831 Chronic kidney disease, stage 3a: Secondary | ICD-10-CM

## 2023-08-26 DIAGNOSIS — I10 Essential (primary) hypertension: Secondary | ICD-10-CM | POA: Diagnosis not present

## 2023-08-26 DIAGNOSIS — M06042 Rheumatoid arthritis without rheumatoid factor, left hand: Secondary | ICD-10-CM | POA: Diagnosis not present

## 2023-08-26 DIAGNOSIS — M06041 Rheumatoid arthritis without rheumatoid factor, right hand: Secondary | ICD-10-CM

## 2023-08-26 NOTE — Assessment & Plan Note (Signed)
 Chronic.  Controlled.  Continue with current medication regimen on Methotrexate 2.5mg  daily.  Continue to follow up with [facility].  Labs ordered today.  Return to clinic in 6 months for reevaluation.  Call sooner if concerns arise.

## 2023-08-26 NOTE — Assessment & Plan Note (Signed)
 Chronic.  Controlled.  Continue with current medication regimen of Valsartan.  Refills sent today.  Continue to check blood pressure at home.  Bring log to next visit.  Labs ordered today.  Return to clinic in 6 months for reevaluation.  Call sooner if concerns arise.

## 2023-08-26 NOTE — Progress Notes (Signed)
 BP 128/88   Pulse 68   Temp 97.7 F (36.5 C) (Oral)   Wt 244 lb 9.6 oz (110.9 kg)   SpO2 95%   BMI 36.12 kg/m    Subjective:    Patient ID: Paul Rodriguez, male    DOB: Mar 11, 1951, 73 y.o.   MRN: [id number]  HPI: Paul Rodriguez is a 73 y.o. male  Chief Complaint  Patient presents with   Hypertension   HYPERTENSION / HYPERLIPIDEMIA See's Dr. Gwen Pounds.   Satisfied with current treatment? yes Duration of hypertension: years BP monitoring frequency: daily BP range: 130/85 BP medication side effects: no Past BP meds: valsartan Duration of hyperlipidemia: years Cholesterol medication side effects: no Cholesterol supplements: none Past cholesterol medications: none Medication compliance: excellent compliance Aspirin: no Recent stressors: no Recurrent headaches: no Visual changes: no Palpitations: no Dyspnea: no Chest pain: no Lower extremity edema: no Dizzy/lightheaded: no Patient had side effects to Statins.   The 10-year ASCVD risk score (Arnett DK, et al., 2019) is: 18.9%   Values used to calculate the score:     Age: 16 years     Sex: Male     Is Non-Hispanic African American: No     Diabetic: No     Tobacco smoker: No     Systolic Blood Pressure: 128 mmHg     Is BP treated: Yes     HDL Cholesterol: 71 mg/dL     Total Cholesterol: 189 mg/dL  CHRONIC KIDNEY DISEASE Cr stable at 1.2.  eGFR- >65 CKD status: controlled Medications renally dose: yes Previous renal evaluation: no Pneumovax:  Up to Date Influenza Vaccine:  Up to Date  RHEUMATOLOGY Patient see's Rheumatology for RA.  On Methotrexate and Folic Acid.  Does have a lot of back pain and takes tylenol/Motrin as needed for back pain. He is still taking Methotrexate.   Patient has been seeing Dr. Lovell Sheehan and has been having spinal stenosis.  It was recommended that he do physical therapy.  He went for 20 sessions. This helped his back a lot.       Relevant past medical, surgical, family and  social history reviewed and updated as indicated. Interim medical history since our last visit reviewed. Allergies and medications reviewed and updated.  Review of Systems  Eyes:  Negative for visual disturbance.  Respiratory:  Negative for shortness of breath.   Cardiovascular:  Negative for chest pain and leg swelling.  Musculoskeletal:  Positive for back pain.  Neurological:  Negative for light-headedness and headaches.    Per HPI unless specifically indicated above     Objective:    BP 128/88   Pulse 68   Temp 97.7 F (36.5 C) (Oral)   Wt 244 lb 9.6 oz (110.9 kg)   SpO2 95%   BMI 36.12 kg/m   Wt Readings from Last 3 Encounters:  08/26/23 244 lb 9.6 oz (110.9 kg)  03/05/23 235 lb (106.6 kg)  02/25/23 242 lb 3.2 oz (109.9 kg)    Physical Exam Vitals and nursing note reviewed.  Constitutional:      General: He is not in acute distress.    Appearance: Normal appearance. He is not ill-appearing, toxic-appearing or diaphoretic.  HENT:     Head: Normocephalic.     Right Ear: External ear normal.     Left Ear: External ear normal.     Nose: Nose normal. No congestion or rhinorrhea.     Mouth/Throat:     Mouth: Mucous membranes are moist.  Eyes:     General:        Right eye: No discharge.        Left eye: No discharge.     Extraocular Movements: Extraocular movements intact.     Conjunctiva/sclera: Conjunctivae normal.     Pupils: Pupils are equal, round, and reactive to light.  Cardiovascular:     Rate and Rhythm: Normal rate and regular rhythm.     Heart sounds: No murmur heard. Pulmonary:     Effort: Pulmonary effort is normal. No respiratory distress.     Breath sounds: Normal breath sounds. No wheezing, rhonchi or rales.  Abdominal:     General: Abdomen is flat. Bowel sounds are normal.  Musculoskeletal:     Cervical back: Normal range of motion and neck supple.  Skin:    General: Skin is warm and dry.     Capillary Refill: Capillary refill takes less than  2 seconds.  Neurological:     General: No focal deficit present.     Mental Status: He is alert and oriented to person, place, and time.  Psychiatric:        Mood and Affect: Mood normal.        Behavior: Behavior normal.        Thought Content: Thought content normal.        Judgment: Judgment normal.     Results for orders placed or performed in visit on 02/25/23  Comp Met (CMET)   Collection Time: 02/25/23 10:55 AM  Result Value Ref Range   Glucose 87 70 - 99 mg/dL   BUN 22 8 - 27 mg/dL   Creatinine, Ser 6.96 0.76 - 1.27 mg/dL   eGFR 67 >29 BM/WUX/3.24   BUN/Creatinine Ratio 19 10 - 24   Sodium 141 134 - 144 mmol/L   Potassium 4.2 3.5 - 5.2 mmol/L   Chloride 103 96 - 106 mmol/L   CO2 22 20 - 29 mmol/L   Calcium 9.5 8.6 - 10.2 mg/dL   Total Protein 6.7 6.0 - 8.5 g/dL   Albumin 4.6 3.8 - 4.8 g/dL   Globulin, Total 2.1 1.5 - 4.5 g/dL   Bilirubin Total 0.4 0.0 - 1.2 mg/dL   Alkaline Phosphatase 65 44 - 121 IU/L   AST 16 0 - 40 IU/L   ALT 24 0 - 44 IU/L  Lipid Profile   Collection Time: 02/25/23 10:55 AM  Result Value Ref Range   Cholesterol, Total 189 100 - 199 mg/dL   Triglycerides 84 0 - 149 mg/dL   HDL 71 >40 mg/dL   VLDL Cholesterol Cal 15 5 - 40 mg/dL   LDL Chol Calc (NIH) 102 (H) 0 - 99 mg/dL   Chol/HDL Ratio 2.7 0.0 - 5.0 ratio  CBC w/Diff   Collection Time: 02/25/23 10:55 AM  Result Value Ref Range   WBC 10.2 3.4 - 10.8 x10E3/uL   RBC 4.29 4.14 - 5.80 x10E6/uL   Hemoglobin 14.7 13.0 - 17.7 g/dL   Hematocrit 72.5 36.6 - 51.0 %   MCV 100 (H) 79 - 97 fL   MCH 34.3 (H) 26.6 - 33.0 pg   MCHC 34.2 31.5 - 35.7 g/dL   RDW 44.0 34.7 - 42.5 %   Platelets 263 150 - 450 x10E3/uL   Neutrophils 66 Not Estab. %   Lymphs 23 Not Estab. %   Monocytes 9 Not Estab. %   Eos 1 Not Estab. %   Basos 0 Not Estab. %   Neutrophils Absolute  6.8 1.4 - 7.0 x10E3/uL   Lymphocytes Absolute 2.3 0.7 - 3.1 x10E3/uL   Monocytes Absolute 1.0 (H) 0.1 - 0.9 x10E3/uL   EOS (ABSOLUTE) 0.1  0.0 - 0.4 x10E3/uL   Basophils Absolute 0.0 0.0 - 0.2 x10E3/uL   Immature Granulocytes 1 Not Estab. %   Immature Grans (Abs) 0.1 0.0 - 0.1 x10E3/uL  Vitamin D (25 hydroxy)   Collection Time: 02/25/23 10:55 AM  Result Value Ref Range   Vit D, 25-Hydroxy 49.9 30.0 - 100.0 ng/mL      Assessment & Plan:   Problem List Items Addressed This Visit       Cardiovascular and Mediastinum   Essential hypertension   Chronic.  Controlled.  Continue with current medication regimen of Valsartan.  Refills sent today.  Continue to check blood pressure at home.  Bring log to next visit.  Labs ordered today.  Return to clinic in 6 months for reevaluation.  Call sooner if concerns arise.       Relevant Orders   Comprehensive metabolic panel with GFR     Musculoskeletal and Integument   Rheumatoid arthritis (HCC)   Chronic.  Controlled.  Continue with current medication regimen on Methotrexate 2.5mg  daily.  Continue to follow up with [facility].  Labs ordered today.  Return to clinic in 6 months for reevaluation.  Call sooner if concerns arise.       Relevant Orders   Sed Rate (ESR)   CBC w/Diff     Genitourinary   CKD (chronic kidney disease) stage 3, GFR 30-59 ml/min (HCC) - Primary   Chronic.  Controlled.  Continue with current medication regimen.  Labs ordered today.  Return to clinic in 6 months for reevaluation.  Call sooner if concerns arise.         Other   Hyperlipidemia   Chronic.  Controlled.  Continue with current medication regimen.  Myalgia's with statin therapy.   Labs ordered today.  Return to clinic in 6 months for reevaluation.  Call sooner if concerns arise.       Relevant Orders   Lipid panel   Vitamin D deficiency   Labs ordered at visit today.  Will make recommendations based on lab results.        Relevant Orders   Vitamin D 1,25 dihydroxy     Follow up plan: Return in about 6 months (around 02/25/2024) for HTN, HLD, DM2 FU.

## 2023-08-26 NOTE — Assessment & Plan Note (Signed)
 Chronic.  Controlled.  Continue with current medication regimen.  Labs ordered today.  Return to clinic in 6 months for reevaluation.  Call sooner if concerns arise.  ? ?

## 2023-08-26 NOTE — Assessment & Plan Note (Signed)
Chronic.  Controlled.  Continue with current medication regimen.  Myalgia's with statin therapy.   Labs ordered today.  Return to clinic in 6 months for reevaluation.  Call sooner if concerns arise.

## 2023-08-26 NOTE — Assessment & Plan Note (Signed)
 Labs ordered at visit today.  Will make recommendations based on lab results.

## 2023-08-27 ENCOUNTER — Other Ambulatory Visit

## 2023-08-27 DIAGNOSIS — M06041 Rheumatoid arthritis without rheumatoid factor, right hand: Secondary | ICD-10-CM | POA: Diagnosis not present

## 2023-08-27 DIAGNOSIS — E559 Vitamin D deficiency, unspecified: Secondary | ICD-10-CM | POA: Diagnosis not present

## 2023-08-27 DIAGNOSIS — E7800 Pure hypercholesterolemia, unspecified: Secondary | ICD-10-CM | POA: Diagnosis not present

## 2023-08-27 DIAGNOSIS — I10 Essential (primary) hypertension: Secondary | ICD-10-CM | POA: Diagnosis not present

## 2023-08-27 DIAGNOSIS — M06042 Rheumatoid arthritis without rheumatoid factor, left hand: Secondary | ICD-10-CM | POA: Diagnosis not present

## 2023-08-27 NOTE — Addendum Note (Signed)
 Addended by: Larae Grooms on: 08/27/2023 09:12 AM   Modules accepted: Orders

## 2023-08-28 ENCOUNTER — Encounter: Payer: Self-pay | Admitting: Nurse Practitioner

## 2023-08-28 LAB — CBC WITH DIFFERENTIAL/PLATELET
Basophils Absolute: 0 10*3/uL (ref 0.0–0.2)
Basos: 1 %
EOS (ABSOLUTE): 0.1 10*3/uL (ref 0.0–0.4)
Eos: 2 %
Hematocrit: 43.7 % (ref 37.5–51.0)
Hemoglobin: 14.5 g/dL (ref 13.0–17.7)
Immature Grans (Abs): 0 10*3/uL (ref 0.0–0.1)
Immature Granulocytes: 0 %
Lymphocytes Absolute: 2 10*3/uL (ref 0.7–3.1)
Lymphs: 38 %
MCH: 32.1 pg (ref 26.6–33.0)
MCHC: 33.2 g/dL (ref 31.5–35.7)
MCV: 97 fL (ref 79–97)
Monocytes Absolute: 0.5 10*3/uL (ref 0.1–0.9)
Monocytes: 10 %
Neutrophils Absolute: 2.7 10*3/uL (ref 1.4–7.0)
Neutrophils: 49 %
Platelets: 199 10*3/uL (ref 150–450)
RBC: 4.52 x10E6/uL (ref 4.14–5.80)
RDW: 14.8 % (ref 11.6–15.4)
WBC: 5.4 10*3/uL (ref 3.4–10.8)

## 2023-08-28 LAB — COMPREHENSIVE METABOLIC PANEL WITH GFR
ALT: 22 IU/L (ref 0–44)
AST: 26 IU/L (ref 0–40)
Albumin: 4.3 g/dL (ref 3.8–4.8)
Alkaline Phosphatase: 80 IU/L (ref 44–121)
BUN/Creatinine Ratio: 13 (ref 10–24)
BUN: 14 mg/dL (ref 8–27)
Bilirubin Total: 0.4 mg/dL (ref 0.0–1.2)
CO2: 22 mmol/L (ref 20–29)
Calcium: 9 mg/dL (ref 8.6–10.2)
Chloride: 105 mmol/L (ref 96–106)
Creatinine, Ser: 1.08 mg/dL (ref 0.76–1.27)
Globulin, Total: 2.1 g/dL (ref 1.5–4.5)
Glucose: 100 mg/dL — ABNORMAL HIGH (ref 70–99)
Potassium: 4.3 mmol/L (ref 3.5–5.2)
Sodium: 141 mmol/L (ref 134–144)
Total Protein: 6.4 g/dL (ref 6.0–8.5)
eGFR: 73 mL/min/{1.73_m2} (ref >59)

## 2023-08-28 LAB — LIPID PANEL
Chol/HDL Ratio: 4.8 ratio (ref 0.0–5.0)
Cholesterol, Total: 171 mg/dL (ref 100–199)
HDL: 36 mg/dL — ABNORMAL LOW (ref >39)
LDL Chol Calc (NIH): 93 mg/dL (ref 0–99)
Triglycerides: 249 mg/dL — ABNORMAL HIGH (ref 0–149)
VLDL Cholesterol Cal: 42 mg/dL — ABNORMAL HIGH (ref 5–40)

## 2023-08-28 LAB — SEDIMENTATION RATE: Sed Rate: 2 mm/h (ref 0–30)

## 2023-08-28 LAB — VITAMIN D 25 HYDROXY (VIT D DEFICIENCY, FRACTURES): Vit D, 25-Hydroxy: 40.9 ng/mL (ref 30.0–100.0)

## 2023-09-02 DIAGNOSIS — Z7960 Long term (current) use of unspecified immunomodulators and immunosuppressants: Secondary | ICD-10-CM | POA: Diagnosis not present

## 2023-09-02 DIAGNOSIS — M0609 Rheumatoid arthritis without rheumatoid factor, multiple sites: Secondary | ICD-10-CM | POA: Diagnosis not present

## 2023-11-28 DIAGNOSIS — M0609 Rheumatoid arthritis without rheumatoid factor, multiple sites: Secondary | ICD-10-CM | POA: Diagnosis not present

## 2023-11-28 DIAGNOSIS — R0602 Shortness of breath: Secondary | ICD-10-CM | POA: Diagnosis not present

## 2023-11-28 DIAGNOSIS — N183 Chronic kidney disease, stage 3 unspecified: Secondary | ICD-10-CM | POA: Diagnosis not present

## 2023-11-28 DIAGNOSIS — E66811 Obesity, class 1: Secondary | ICD-10-CM | POA: Diagnosis not present

## 2023-11-28 DIAGNOSIS — I2089 Other forms of angina pectoris: Secondary | ICD-10-CM | POA: Diagnosis not present

## 2023-11-28 DIAGNOSIS — I10 Essential (primary) hypertension: Secondary | ICD-10-CM | POA: Diagnosis not present

## 2023-11-28 DIAGNOSIS — E782 Mixed hyperlipidemia: Secondary | ICD-10-CM | POA: Diagnosis not present

## 2023-12-02 ENCOUNTER — Other Ambulatory Visit: Payer: Self-pay | Admitting: Internal Medicine

## 2023-12-02 DIAGNOSIS — R0602 Shortness of breath: Secondary | ICD-10-CM

## 2023-12-02 DIAGNOSIS — I10 Essential (primary) hypertension: Secondary | ICD-10-CM

## 2023-12-02 DIAGNOSIS — I2089 Other forms of angina pectoris: Secondary | ICD-10-CM

## 2023-12-04 ENCOUNTER — Other Ambulatory Visit: Payer: Self-pay | Admitting: Nurse Practitioner

## 2023-12-05 NOTE — Telephone Encounter (Signed)
 Requested medications are due for refill today.  yes  Requested medications are on the active medications list.  yes  Last refill. 06/10/2023 #270 1 rf  Future visit scheduled.   yes  Notes to clinic.  Refill not delegated.    Requested Prescriptions  Pending Prescriptions Disp Refills   tiZANidine  (ZANAFLEX ) 4 MG tablet [Pharmacy Med Name: TIZANIDINE  HCL 4 MG TABLET] 270 tablet 1    Sig: TAKE 1 TABLET BY MOUTH THREE TIMES A DAY AS NEEDED     Not Delegated - Cardiovascular:  Alpha-2 Agonists - tizanidine  Failed - 12/05/2023 11:53 AM      Failed - This refill cannot be delegated      Passed - Valid encounter within last 6 months    Recent Outpatient Visits           3 months ago Stage 3a chronic kidney disease (HCC)   [facility] [name], NP

## 2023-12-06 ENCOUNTER — Telehealth (HOSPITAL_COMMUNITY): Payer: Self-pay | Admitting: *Deleted

## 2023-12-06 DIAGNOSIS — R0602 Shortness of breath: Secondary | ICD-10-CM | POA: Diagnosis not present

## 2023-12-06 NOTE — Telephone Encounter (Signed)
 Attempted to call patient regarding upcoming cardiac CT appointment. Left message on voicemail with name and callback number  Larey Brick RN Navigator Cardiac Imaging [facility] [phone] Office [phone] Cell

## 2023-12-09 ENCOUNTER — Ambulatory Visit
Admission: RE | Admit: 2023-12-09 | Discharge: 2023-12-09 | Disposition: A | Discharge status: Home / Self Care | Source: Ambulatory Visit | Attending: Internal Medicine | Admitting: Internal Medicine

## 2023-12-09 DIAGNOSIS — R0602 Shortness of breath: Secondary | ICD-10-CM | POA: Diagnosis not present

## 2023-12-09 DIAGNOSIS — I2089 Other forms of angina pectoris: Secondary | ICD-10-CM | POA: Diagnosis not present

## 2023-12-09 DIAGNOSIS — I10 Essential (primary) hypertension: Secondary | ICD-10-CM | POA: Diagnosis not present

## 2023-12-09 LAB — POCT I-STAT CREATININE: Creatinine, Ser: 1.4 mg/dL — ABNORMAL HIGH (ref 0.61–1.24)

## 2023-12-09 MED ORDER — METOPROLOL TARTRATE 5 MG/5ML IV SOLN
INTRAVENOUS | Status: AC
  Filled 2023-12-09: qty 10

## 2023-12-09 MED ORDER — DILTIAZEM HCL 25 MG/5ML IV SOLN
10.0000 mg | INTRAVENOUS | Status: DC | PRN
  Filled 2023-12-09: qty 5

## 2023-12-09 MED ORDER — DILTIAZEM HCL 25 MG/5ML IV SOLN
INTRAVENOUS | Status: AC
  Filled 2023-12-09: qty 5

## 2023-12-09 MED ORDER — IOHEXOL 350 MG/ML SOLN
100.0000 mL | Freq: Once | INTRAVENOUS | Status: AC | PRN
  Administered 2023-12-09: 100 mL via INTRAVENOUS

## 2023-12-09 MED ORDER — NITROGLYCERIN 0.4 MG SL SUBL
0.8000 mg | SUBLINGUAL_TABLET | Freq: Once | SUBLINGUAL | Status: AC
  Administered 2023-12-09: 0.8 mg via SUBLINGUAL
  Filled 2023-12-09: qty 25

## 2023-12-09 MED ORDER — METOPROLOL TARTRATE 5 MG/5ML IV SOLN
10.0000 mg | INTRAVENOUS | Status: DC | PRN
  Administered 2023-12-09: 10 mg via INTRAVENOUS
  Filled 2023-12-09 (×2): qty 10

## 2023-12-10 DIAGNOSIS — Z6835 Body mass index (BMI) 35.0-35.9, adult: Secondary | ICD-10-CM | POA: Diagnosis not present

## 2023-12-10 DIAGNOSIS — M4316 Spondylolisthesis, lumbar region: Secondary | ICD-10-CM | POA: Diagnosis not present

## 2023-12-11 DIAGNOSIS — I2089 Other forms of angina pectoris: Secondary | ICD-10-CM | POA: Diagnosis not present

## 2023-12-20 DIAGNOSIS — D2272 Melanocytic nevi of left lower limb, including hip: Secondary | ICD-10-CM | POA: Diagnosis not present

## 2023-12-20 DIAGNOSIS — D2271 Melanocytic nevi of right lower limb, including hip: Secondary | ICD-10-CM | POA: Diagnosis not present

## 2023-12-20 DIAGNOSIS — L821 Other seborrheic keratosis: Secondary | ICD-10-CM | POA: Diagnosis not present

## 2023-12-20 DIAGNOSIS — L570 Actinic keratosis: Secondary | ICD-10-CM | POA: Diagnosis not present

## 2023-12-20 DIAGNOSIS — D2262 Melanocytic nevi of left upper limb, including shoulder: Secondary | ICD-10-CM | POA: Diagnosis not present

## 2023-12-20 DIAGNOSIS — D2261 Melanocytic nevi of right upper limb, including shoulder: Secondary | ICD-10-CM | POA: Diagnosis not present

## 2023-12-20 DIAGNOSIS — L739 Follicular disorder, unspecified: Secondary | ICD-10-CM | POA: Diagnosis not present

## 2023-12-20 DIAGNOSIS — D225 Melanocytic nevi of trunk: Secondary | ICD-10-CM | POA: Diagnosis not present

## 2024-01-02 DIAGNOSIS — Z7960 Long term (current) use of unspecified immunomodulators and immunosuppressants: Secondary | ICD-10-CM | POA: Diagnosis not present

## 2024-01-02 DIAGNOSIS — M0609 Rheumatoid arthritis without rheumatoid factor, multiple sites: Secondary | ICD-10-CM | POA: Diagnosis not present

## 2024-01-02 DIAGNOSIS — Z111 Encounter for screening for respiratory tuberculosis: Secondary | ICD-10-CM | POA: Diagnosis not present

## 2024-01-08 DIAGNOSIS — M0609 Rheumatoid arthritis without rheumatoid factor, multiple sites: Secondary | ICD-10-CM | POA: Diagnosis not present

## 2024-01-08 DIAGNOSIS — I10 Essential (primary) hypertension: Secondary | ICD-10-CM | POA: Diagnosis not present

## 2024-01-08 DIAGNOSIS — E66811 Obesity, class 1: Secondary | ICD-10-CM | POA: Diagnosis not present

## 2024-01-08 DIAGNOSIS — N183 Chronic kidney disease, stage 3 unspecified: Secondary | ICD-10-CM | POA: Diagnosis not present

## 2024-01-08 DIAGNOSIS — E782 Mixed hyperlipidemia: Secondary | ICD-10-CM | POA: Diagnosis not present

## 2024-01-09 DIAGNOSIS — M0609 Rheumatoid arthritis without rheumatoid factor, multiple sites: Secondary | ICD-10-CM | POA: Diagnosis not present

## 2024-01-15 DIAGNOSIS — M654 Radial styloid tenosynovitis [de Quervain]: Secondary | ICD-10-CM | POA: Diagnosis not present

## 2024-01-15 DIAGNOSIS — M1811 Unilateral primary osteoarthritis of first carpometacarpal joint, right hand: Secondary | ICD-10-CM | POA: Diagnosis not present

## 2024-01-15 DIAGNOSIS — E66811 Obesity, class 1: Secondary | ICD-10-CM | POA: Diagnosis not present

## 2024-01-15 DIAGNOSIS — M25531 Pain in right wrist: Secondary | ICD-10-CM | POA: Diagnosis not present

## 2024-01-23 DIAGNOSIS — M0609 Rheumatoid arthritis without rheumatoid factor, multiple sites: Secondary | ICD-10-CM | POA: Diagnosis not present

## 2024-02-06 DIAGNOSIS — M0609 Rheumatoid arthritis without rheumatoid factor, multiple sites: Secondary | ICD-10-CM | POA: Diagnosis not present

## 2024-02-18 DIAGNOSIS — M4316 Spondylolisthesis, lumbar region: Secondary | ICD-10-CM | POA: Diagnosis not present

## 2024-02-21 DIAGNOSIS — H2513 Age-related nuclear cataract, bilateral: Secondary | ICD-10-CM | POA: Diagnosis not present

## 2024-02-21 DIAGNOSIS — M3501 Sicca syndrome with keratoconjunctivitis: Secondary | ICD-10-CM | POA: Diagnosis not present

## 2024-02-21 DIAGNOSIS — Z9889 Other specified postprocedural states: Secondary | ICD-10-CM | POA: Diagnosis not present

## 2024-02-25 DIAGNOSIS — M4316 Spondylolisthesis, lumbar region: Secondary | ICD-10-CM | POA: Diagnosis not present

## 2024-02-26 ENCOUNTER — Ambulatory Visit: Admitting: Nurse Practitioner

## 2024-02-28 DIAGNOSIS — M4316 Spondylolisthesis, lumbar region: Secondary | ICD-10-CM | POA: Diagnosis not present

## 2024-03-03 ENCOUNTER — Encounter: Payer: Self-pay | Admitting: Nurse Practitioner

## 2024-03-03 ENCOUNTER — Ambulatory Visit (INDEPENDENT_AMBULATORY_CARE_PROVIDER_SITE_OTHER): Admitting: Nurse Practitioner

## 2024-03-03 VITALS — BP 114/78 | HR 60 | Temp 97.6°F | Ht 69.5 in | Wt 232.6 lb

## 2024-03-03 DIAGNOSIS — E559 Vitamin D deficiency, unspecified: Secondary | ICD-10-CM

## 2024-03-03 DIAGNOSIS — M4316 Spondylolisthesis, lumbar region: Secondary | ICD-10-CM | POA: Diagnosis not present

## 2024-03-03 DIAGNOSIS — E66811 Obesity, class 1: Secondary | ICD-10-CM | POA: Insufficient documentation

## 2024-03-03 DIAGNOSIS — I10 Essential (primary) hypertension: Secondary | ICD-10-CM | POA: Diagnosis not present

## 2024-03-03 DIAGNOSIS — M06041 Rheumatoid arthritis without rheumatoid factor, right hand: Secondary | ICD-10-CM | POA: Diagnosis not present

## 2024-03-03 DIAGNOSIS — N1831 Chronic kidney disease, stage 3a: Secondary | ICD-10-CM | POA: Diagnosis not present

## 2024-03-03 DIAGNOSIS — E7800 Pure hypercholesterolemia, unspecified: Secondary | ICD-10-CM

## 2024-03-03 DIAGNOSIS — M06042 Rheumatoid arthritis without rheumatoid factor, left hand: Secondary | ICD-10-CM | POA: Diagnosis not present

## 2024-03-03 DIAGNOSIS — Z23 Encounter for immunization: Secondary | ICD-10-CM | POA: Diagnosis not present

## 2024-03-03 DIAGNOSIS — N400 Enlarged prostate without lower urinary tract symptoms: Secondary | ICD-10-CM | POA: Diagnosis not present

## 2024-03-03 MED ORDER — SILDENAFIL CITRATE 100 MG PO TABS: 50.0000 mg | ORAL_TABLET | Freq: Every day | ORAL | 2 refills | Status: AC | PRN

## 2024-03-03 MED ORDER — ZEPBOUND 2.5 MG/0.5ML ~~LOC~~ SOAJ: 2.5000 mg | SUBCUTANEOUS | 0 refills | Status: DC

## 2024-03-03 NOTE — Assessment & Plan Note (Signed)
Chronic.  Controlled.  Continue with current medication regimen.  Myalgia's with statin therapy.   Labs ordered today.  Return to clinic in 6 months for reevaluation.  Call sooner if concerns arise.

## 2024-03-03 NOTE — Progress Notes (Signed)
 BP 114/78   Pulse 60   Temp 97.6 F (36.4 C) (Oral)   Ht 5' 9.5 (1.765 m)   Wt 232 lb 9.6 oz (105.5 kg)   SpO2 96%   BMI 33.86 kg/m    Subjective:    Patient ID: Paul Rodriguez, male    DOB: 04-02-1951, 73 y.o.   MRN: [id number]  HPI: Paul Rodriguez is a 73 y.o. male  Chief Complaint  Patient presents with   Chronic Kidney Disease   Hyperlipidemia   Hypertension   HYPERTENSION / HYPERLIPIDEMIA See's Dr. Florencio.  Valsartan  was increased to 320mg  and added Chlothalidone  Satisfied with current treatment? yes Duration of hypertension: years BP monitoring frequency: daily BP range: <120/80 BP medication side effects: no Past BP meds: valsartan  Duration of hyperlipidemia: years Cholesterol medication side effects: no Cholesterol supplements: none Past cholesterol medications: none Medication compliance: excellent compliance Aspirin: no Recent stressors: no Recurrent headaches: no Visual changes: no Palpitations: no Dyspnea: no Chest pain: no Lower extremity edema: no Dizzy/lightheaded: no Patient had side effects to Statins.   The 10-year ASCVD risk score (Arnett DK, et al., 2019) is: 20.9%   Values used to calculate the score:     Age: 49 years     Clincally relevant sex: Male     Is Non-Hispanic African American: No     Diabetic: No     Tobacco smoker: No     Systolic Blood Pressure: 114 mmHg     Is BP treated: Yes     HDL Cholesterol: 36 mg/dL     Total Cholesterol: 171 mg/dL  CHRONIC KIDNEY DISEASE Cr stable at 1.2.  eGFR- >65 CKD status: controlled Medications renally dose: yes Previous renal evaluation: no Pneumovax:  Up to Date Influenza Vaccine:  Up to Date  RHEUMATOLOGY Patient see's Rheumatology for RA.  On Methotrexate and Folic Acid.  Does have a lot of back pain and takes tylenol /Motrin as needed for back pain. He is still taking Methotrexate.   Patient has been seeing Dr. Mavis and has been having spinal stenosis. He is  currently doing physical therapy.  He is working on weight loss.  States he has lost 14lbs.   Patient states he is having problems with erectile dysfunction.      Relevant past medical, surgical, family and social history reviewed and updated as indicated. Interim medical history since our last visit reviewed. Allergies and medications reviewed and updated.  Review of Systems  Constitutional:  Positive for unexpected weight change.  Eyes:  Negative for visual disturbance.  Respiratory:  Negative for shortness of breath.   Cardiovascular:  Negative for chest pain and leg swelling.  Musculoskeletal:  Positive for back pain.  Neurological:  Negative for light-headedness and headaches.    Per HPI unless specifically indicated above     Objective:    BP 114/78   Pulse 60   Temp 97.6 F (36.4 C) (Oral)   Ht 5' 9.5 (1.765 m)   Wt 232 lb 9.6 oz (105.5 kg)   SpO2 96%   BMI 33.86 kg/m   Wt Readings from Last 3 Encounters:  03/03/24 232 lb 9.6 oz (105.5 kg)  08/26/23 244 lb 9.6 oz (110.9 kg)  03/05/23 235 lb (106.6 kg)    Physical Exam Vitals and nursing note reviewed.  Constitutional:      General: He is not in acute distress.    Appearance: Normal appearance. He is not ill-appearing, toxic-appearing or diaphoretic.  HENT:  Head: Normocephalic.     Right Ear: External ear normal.     Left Ear: External ear normal.     Nose: Nose normal. No congestion or rhinorrhea.     Mouth/Throat:     Mouth: Mucous membranes are moist.  Eyes:     General:        Right eye: No discharge.        Left eye: No discharge.     Extraocular Movements: Extraocular movements intact.     Conjunctiva/sclera: Conjunctivae normal.     Pupils: Pupils are equal, round, and reactive to light.  Cardiovascular:     Rate and Rhythm: Normal rate and regular rhythm.     Heart sounds: No murmur heard. Pulmonary:     Effort: Pulmonary effort is normal. No respiratory distress.     Breath sounds:  Normal breath sounds. No wheezing, rhonchi or rales.  Abdominal:     General: Abdomen is flat. Bowel sounds are normal.  Musculoskeletal:     Cervical back: Normal range of motion and neck supple.  Skin:    General: Skin is warm and dry.     Capillary Refill: Capillary refill takes less than 2 seconds.  Neurological:     General: No focal deficit present.     Mental Status: He is alert and oriented to person, place, and time.  Psychiatric:        Mood and Affect: Mood normal.        Behavior: Behavior normal.        Thought Content: Thought content normal.        Judgment: Judgment normal.     Results for orders placed or performed during the hospital encounter of 12/09/23  I-STAT creatinine   Collection Time: 12/09/23  2:32 PM  Result Value Ref Range   Creatinine, Ser 1.40 (H) 0.61 - 1.24 mg/dL      Assessment & Plan:   Problem List Items Addressed This Visit       Cardiovascular and Mediastinum   Essential hypertension   Relevant Medications   chlorthalidone (HYGROTON) 25 MG tablet   valsartan  (DIOVAN ) 320 MG tablet     Musculoskeletal and Integument   Rheumatoid arthritis (HCC) - Primary   Relevant Orders   CBC w/Diff     Genitourinary   CKD (chronic kidney disease) stage 3, GFR 30-59 ml/min (HCC)   Relevant Orders   Comp Met (CMET)   CBC w/Diff     Other   Hyperlipidemia   Relevant Medications   chlorthalidone (HYGROTON) 25 MG tablet   valsartan  (DIOVAN ) 320 MG tablet   Other Relevant Orders   Lipid Profile   Vitamin D  deficiency   Relevant Orders   Vitamin D  (25 hydroxy)   Other Visit Diagnoses       Need for influenza vaccination       Relevant Orders   Flu vaccine HIGH DOSE PF(Fluzone Trivalent) (Completed)        Follow up plan: No follow-ups on file.

## 2024-03-03 NOTE — Assessment & Plan Note (Signed)
 Chronic.  Controlled.  Continue with current medication regimen.  Labs ordered today.  Return to clinic in 6 months for reevaluation.  Call sooner if concerns arise.  ? ?

## 2024-03-03 NOTE — Assessment & Plan Note (Addendum)
 Chronic.  Controlled.  Continue with current medication regimen of Valsartan  and chlorthalidone.  Reviewed recent note. Refills sent today.  Continue to check blood pressure at home.  Bring log to next visit.  Labs ordered today.  Return to clinic in 6 months for reevaluation.  Call sooner if concerns arise.

## 2024-03-03 NOTE — Assessment & Plan Note (Signed)
 Labs ordered at visit today.  Will make recommendations based on lab results.

## 2024-03-03 NOTE — Assessment & Plan Note (Signed)
 Chronic.  Controlled.  Continue with current medication regimen on Methotrexate 2.5mg  daily.  Continue to follow up with [facility].  Labs ordered today.  Return to clinic in 6 months for reevaluation.  Call sooner if concerns arise.

## 2024-03-03 NOTE — Assessment & Plan Note (Signed)
 Chronic.  Ongoing.  Will start Zepbound 2.5mg  weekly.  Side effects and benefits of medication discussed during visit.  Will increase to 5mg  after 1 month.  Follow up in 3 months.  Call sooner if concerns arise.

## 2024-03-04 ENCOUNTER — Ambulatory Visit: Payer: Self-pay | Admitting: Nurse Practitioner

## 2024-03-04 LAB — CBC WITH DIFFERENTIAL/PLATELET
Basophils Absolute: 0.1 x10E3/uL (ref 0.0–0.2)
Basos: 1 %
EOS (ABSOLUTE): 0.1 x10E3/uL (ref 0.0–0.4)
Eos: 2 %
Hematocrit: 46 % (ref 37.5–51.0)
Hemoglobin: 15.4 g/dL (ref 13.0–17.7)
Immature Grans (Abs): 0 x10E3/uL (ref 0.0–0.1)
Immature Granulocytes: 0 %
Lymphocytes Absolute: 2.6 x10E3/uL (ref 0.7–3.1)
Lymphs: 43 %
MCH: 32.7 pg (ref 26.6–33.0)
MCHC: 33.5 g/dL (ref 31.5–35.7)
MCV: 98 fL — ABNORMAL HIGH (ref 79–97)
Monocytes Absolute: 0.7 x10E3/uL (ref 0.1–0.9)
Monocytes: 11 %
Neutrophils Absolute: 2.5 x10E3/uL (ref 1.4–7.0)
Neutrophils: 43 %
Platelets: 214 x10E3/uL (ref 150–450)
RBC: 4.71 x10E6/uL (ref 4.14–5.80)
RDW: 13.6 % (ref 11.6–15.4)
WBC: 5.9 x10E3/uL (ref 3.4–10.8)

## 2024-03-04 LAB — COMPREHENSIVE METABOLIC PANEL WITH GFR
ALT: 26 IU/L (ref 0–44)
AST: 29 IU/L (ref 0–40)
Albumin: 4.5 g/dL (ref 3.8–4.8)
Alkaline Phosphatase: 71 IU/L (ref 47–123)
BUN/Creatinine Ratio: 18 (ref 10–24)
BUN: 26 mg/dL (ref 8–27)
Bilirubin Total: 0.7 mg/dL (ref 0.0–1.2)
CO2: 23 mmol/L (ref 20–29)
Calcium: 9.8 mg/dL (ref 8.6–10.2)
Chloride: 99 mmol/L (ref 96–106)
Creatinine, Ser: 1.41 mg/dL — ABNORMAL HIGH (ref 0.76–1.27)
Globulin, Total: 2.4 g/dL (ref 1.5–4.5)
Glucose: 91 mg/dL (ref 70–99)
Potassium: 4.2 mmol/L (ref 3.5–5.2)
Sodium: 137 mmol/L (ref 134–144)
Total Protein: 6.9 g/dL (ref 6.0–8.5)
eGFR: 53 mL/min/1.73 — ABNORMAL LOW (ref >59)

## 2024-03-04 LAB — LIPID PANEL
Chol/HDL Ratio: 4.4 ratio (ref 0.0–5.0)
Cholesterol, Total: 182 mg/dL (ref 100–199)
HDL: 41 mg/dL (ref >39)
LDL Chol Calc (NIH): 108 mg/dL — ABNORMAL HIGH (ref 0–99)
Triglycerides: 191 mg/dL — ABNORMAL HIGH (ref 0–149)
VLDL Cholesterol Cal: 33 mg/dL (ref 5–40)

## 2024-03-04 LAB — PSA: Prostate Specific Ag, Serum: 1 ng/mL (ref 0.0–4.0)

## 2024-03-04 LAB — VITAMIN D 25 HYDROXY (VIT D DEFICIENCY, FRACTURES): Vit D, 25-Hydroxy: 51.9 ng/mL (ref 30.0–100.0)

## 2024-03-05 DIAGNOSIS — M0609 Rheumatoid arthritis without rheumatoid factor, multiple sites: Secondary | ICD-10-CM | POA: Diagnosis not present

## 2024-03-06 DIAGNOSIS — M4316 Spondylolisthesis, lumbar region: Secondary | ICD-10-CM | POA: Diagnosis not present

## 2024-03-09 ENCOUNTER — Encounter: Payer: Self-pay | Admitting: Nurse Practitioner

## 2024-03-09 MED ORDER — ZEPBOUND 2.5 MG/0.5ML ~~LOC~~ SOAJ: 2.5000 mg | SUBCUTANEOUS | 0 refills | Status: DC

## 2024-03-10 ENCOUNTER — Ambulatory Visit: Payer: Self-pay | Admitting: Emergency Medicine

## 2024-03-10 VITALS — BP 125/82 | Ht 69.0 in | Wt 230.6 lb

## 2024-03-10 DIAGNOSIS — M4316 Spondylolisthesis, lumbar region: Secondary | ICD-10-CM | POA: Diagnosis not present

## 2024-03-10 DIAGNOSIS — Z0000 Encounter for general adult medical examination without abnormal findings: Secondary | ICD-10-CM

## 2024-03-10 NOTE — Patient Instructions (Signed)
 Paul Rodriguez,  Thank you for taking the time for your Medicare Wellness Visit. I appreciate your continued commitment to your health goals. Please review the care plan we discussed, and feel free to reach out if I can assist you further.  Medicare recommends these wellness visits once per year to help you and your care team stay ahead of potential health issues. These visits are designed to focus on prevention, allowing your provider to concentrate on managing your acute and chronic conditions during your regular appointments.  Please note that Annual Wellness Visits do not include a physical exam. Some assessments may be limited, especially if the visit was conducted virtually. If needed, we may recommend a separate in-person follow-up with your provider.  Ongoing Care Seeing your primary care provider every 3 to 6 months helps us  monitor your health and provide consistent, personalized care.   Referrals If a referral was made during today's visit and you haven't received any updates within two weeks, please contact the referred provider directly to check on the status.  Recommended Screenings: Your colonoscopy will be due March 2027.   Health Maintenance  Topic Date Due   COVID-19 Vaccine (10 - 2025-26 season) 03/19/2024*   DTaP/Tdap/Td vaccine (2 - Td or Tdap) 03/03/2025*   Medicare Annual Wellness Visit  03/10/2025   Colon Cancer Screening  07/22/2025   Pneumococcal Vaccine for age over 94  Completed   Flu Shot  Completed   Hepatitis C Screening  Completed   Zoster (Shingles) Vaccine  Completed   Meningitis B Vaccine  Aged Out  *Topic was postponed. The date shown is not the original due date.       03/10/2024    9:38 AM  Advanced Directives  Does Patient Have a Medical Advance Directive? Yes  Type of Estate agent of [place];Living will  Does patient want to make changes to medical advance directive? No - Patient declined  Copy of Healthcare Power of  Attorney in Chart? No - copy requested   Advance Care Planning is important because it: Ensures you receive medical care that aligns with your values, goals, and preferences. Provides guidance to your family and loved ones, reducing the emotional burden of decision-making during critical moments.  Vision: Annual vision screenings are recommended for early detection of glaucoma, cataracts, and diabetic retinopathy. These exams can also reveal signs of chronic conditions such as diabetes and high blood pressure.  Dental: Annual dental screenings help detect early signs of oral cancer, gum disease, and other conditions linked to overall health, including heart disease and diabetes.  Please see the attached documents for additional preventive care recommendations.   Fall Prevention in the Home, Adult Falls can cause injuries and affect people of all ages. There are many simple things that you can do to make your home safe and to help prevent falls. If you need it, ask for help making these changes. What actions can I take to prevent falls? General information Use good lighting in all rooms. Make sure to: Replace any light bulbs that burn out. Turn on lights if it is dark and use night-lights. Keep items that you use often in easy-to-reach places. Lower the shelves around your home if needed. Move furniture so that there are clear paths around it. Do not keep throw rugs or other things on the floor that can make you trip. If any of your floors are uneven, fix them. Add color or contrast paint or tape to clearly mark and help  you see: Grab bars or handrails. First and last steps of staircases. Where the edge of each step is. If you use a ladder or stepladder: Make sure that it is fully opened. Do not climb a closed ladder. Make sure the sides of the ladder are locked in place. Have someone hold the ladder while you use it. Know where your pets are as you move through your home. What can I  do in the bathroom?     Keep the floor dry. Clean up any water  that is on the floor right away. Remove soap buildup in the bathtub or shower. Buildup makes bathtubs and showers slippery. Use non-skid mats or decals on the floor of the bathtub or shower. Attach bath mats securely with double-sided, non-slip rug tape. If you need to sit down while you are in the shower, use a non-slip stool. Install grab bars by the toilet and in the bathtub and shower. Do not use towel bars as grab bars. What can I do in the bedroom? Make sure that you have a light by your bed that is easy to reach. Do not use any sheets or blankets on your bed that hang to the floor. Have a firm bench or chair with side arms that you can use for support when you get dressed. What can I do in the kitchen? Clean up any spills right away. If you need to reach something above you, use a sturdy step stool that has a grab bar. Keep electrical cables out of the way. Do not use floor polish or wax that makes floors slippery. What can I do with my stairs? Do not leave anything on the stairs. Make sure that you have a light switch at the top and the bottom of the stairs. Have them installed if you do not have them. Make sure that there are handrails on both sides of the stairs. Fix handrails that are broken or loose. Make sure that handrails are as long as the staircases. Install non-slip stair treads on all stairs in your home if they do not have carpet. Avoid having throw rugs at the top or bottom of stairs, or secure the rugs with carpet tape to prevent them from moving. Choose a carpet design that does not hide the edge of steps on the stairs. Make sure that carpet is firmly attached to the stairs. Fix any carpet that is loose or worn. What can I do on the outside of my home? Use bright outdoor lighting. Repair the edges of walkways and driveways and fix any cracks. Clear paths of anything that can make you trip, such as tools  or rocks. Add color or contrast paint or tape to clearly mark and help you see high doorway thresholds. Trim any bushes or trees on the main path into your home. Check that handrails are securely fastened and in good repair. Both sides of all steps should have handrails. Install guardrails along the edges of any raised decks or porches. Have leaves, snow, and ice cleared regularly. Use sand, salt, or ice melt on walkways during winter months if you live where there is ice and snow. In the garage, clean up any spills right away, including grease or oil spills. What other actions can I take? Review your medicines with your health care provider. Some medicines can make you confused or feel dizzy. This can increase your chance of falling. Wear closed-toe shoes that fit well and support your feet. Wear shoes that have rubber soles  and low heels. Use a cane, walker, scooter, or crutches that help you move around if needed. Talk with your provider about other ways that you can decrease your risk of falls. This may include seeing a physical therapist to learn to do exercises to improve movement and strength. Where to find more information Centers for Disease Control and Prevention, [employer]: TonerPromos.no [employer] on Aging: BaseRingTones.pl National Institute on Aging: BaseRingTones.pl Contact a health care provider if: You are afraid of falling at home. You feel weak, drowsy, or dizzy at home. You fall at home. Get help right away if you: Lose consciousness or have trouble moving after a fall. Have a fall that causes a head injury. These symptoms may be an emergency. Get help right away. Call 911. Do not wait to see if the symptoms will go away. Do not drive yourself to the hospital. This information is not intended to replace advice given to you by your health care provider. Make sure you discuss any questions you have with your health care provider. Document Revised: 01/08/2022 Document Reviewed:  01/08/2022 Elsevier Patient Education  2024 [employer].

## 2024-03-10 NOTE — Progress Notes (Signed)
 Subjective:   Paul Rodriguez is a 73 y.o. who presents for a Medicare Wellness preventive visit.  As a reminder, Annual Wellness Visits don't include a physical exam, and some assessments may be limited, especially if this visit is performed virtually. We may recommend an in-person follow-up visit with your provider if needed.  Visit Complete: In person   Persons Participating in Visit: Patient.  AWV Questionnaire: No: Patient Medicare AWV questionnaire was not completed prior to this visit.  Cardiac Risk Factors include: advanced age (>50men, >55 women);male gender;dyslipidemia;hypertension;obesity (BMI >30kg/m2)     Objective:    Today's Vitals   03/10/24 0914 03/10/24 0920  BP: 125/82   Weight: 230 lb 9.6 oz (104.6 kg)   Height: 5' 9 (1.753 m)   PainSc:  2    Body mass index is 34.05 kg/m.     03/10/2024    9:38 AM 03/05/2023   10:00 AM 03/20/2021   11:28 AM 02/10/2021    9:07 AM 02/01/2020    8:16 AM 01/05/2019    1:12 PM 07/23/2018   10:23 AM  Advanced Directives  Does Patient Have a Medical Advance Directive? Yes Yes Yes Yes Yes Yes Yes   Type of Estate agent of [place];Living will Healthcare Power of [place];Living will Healthcare Power of [place];Living will Healthcare Power of [place];Living will Healthcare Power of [place];Living will Healthcare Power of [place];Living will Healthcare Power of [place];Living will  Does patient want to make changes to medical advance directive? No - Patient declined No - Patient declined No - Patient declined    No - Patient declined   Copy of Healthcare Power of Attorney in Chart? No - copy requested No - copy requested No - copy requested No - copy requested No - copy requested No - copy requested  No - copy requested      Data saved with a previous flowsheet row definition    Current Medications (verified) Outpatient Encounter Medications as of 03/10/2024  Medication Sig   acetaminophen   (TYLENOL ) 325 MG tablet Take 650 mg by mouth every 6 (six) hours as needed for moderate pain.   chlorthalidone (HYGROTON) 25 MG tablet Take 25 mg by mouth daily.   Cholecalciferol (VITAMIN D3) 25 MCG (1000 UT) CAPS Take 1,000 Units by mouth daily.   clindamycin (CLEOCIN T) 1 % external solution Apply 1 Application topically 2 (two) times daily as needed.   folic acid (FOLVITE) 1 MG tablet Take 1 mg by mouth daily.   loratadine -pseudoephedrine  (CLARITIN -D 12-HOUR) 5-120 MG tablet Take 1 tablet by mouth every 12 (twelve) hours as needed for allergies.   methotrexate (RHEUMATREX) 2.5 MG tablet Take 15 mg by mouth once a week.    Multiple Vitamin (MULTIVITAMIN) tablet Take 1 tablet by mouth daily.   Omega-3 Fatty Acids (FISH OIL) 1000 MG CAPS Take 1,000 mg by mouth daily.   omeprazole (PRILOSEC) 20 MG capsule Take 20 mg by mouth daily.   oxymetazoline  (AFRIN) 0.05 % nasal spray Place 1 spray into both nostrils 2 (two) times daily as needed for congestion.   Polyethyl Glycol-Propyl Glycol (SYSTANE OP) Place 1 drop into both eyes daily as needed (dry eyes).   tiZANidine  (ZANAFLEX ) 4 MG tablet TAKE 1 TABLET BY MOUTH THREE TIMES A DAY AS NEEDED   TURMERIC CURCUMIN PO Take 1 capsule by mouth daily.   valsartan  (DIOVAN ) 320 MG tablet Take 320 mg by mouth daily.   vitamin B-12 (CYANOCOBALAMIN) 1000 MCG tablet Take 1,000 mcg by  mouth daily.   sildenafil (VIAGRA) 100 MG tablet Take 0.5-1 tablets (50-100 mg total) by mouth daily as needed for erectile dysfunction. (Patient not taking: Reported on 03/10/2024)   tirzepatide (ZEPBOUND) 2.5 MG/0.5ML Pen Inject 2.5 mg into the skin once a week. (Patient not taking: Reported on 03/10/2024)   No facility-administered encounter medications on file as of 03/10/2024.    Allergies (verified) Ultram [tramadol]   History: Past Medical History:  Diagnosis Date   Allergy    Arthritis    Rheumatoid arthritis   Chronic kidney disease    Complication of anesthesia     2014-Pt reported that BP dropped durign procedure.   Depression    GERD (gastroesophageal reflux disease)    History of kidney stones    Hyperlipidemia    Hypertension    Past Surgical History:  Procedure Laterality Date   COLONOSCOPY WITH PROPOFOL  N/A 07/23/2018   Procedure: COLONOSCOPY WITH PROPOFOL ;  Surgeon: Unk Corinn Skiff, MD;  Location[facility];  Service: Endoscopy;  Laterality: N/A;  Requests early   EYE SURGERY  Lasik   eyelid surgery Bilateral 06/2020   HIP SURGERY Right 2014   arthroscopic   HIP SURGERY Left 03/2013   arthroscopic   KNEE SURGERY Left 03/2013   NOSE SURGERY     POLYPECTOMY  07/23/2018   Procedure: POLYPECTOMY;  Surgeon: Unk Corinn Skiff, MD;  Location: [facility];  Service: Endoscopy;;   SPINE SURGERY     Family History  Problem Relation Age of Onset   Hypertension Mother    Arthritis Mother    Hypertension Father    Heart disease Father    Stroke Father    Heart disease Maternal Grandmother    Diabetes Maternal Grandfather    Cancer Paternal Grandmother    Cancer Paternal Grandfather    Social History   Socioeconomic History   Marital status: Married    Spouse name: Heron   Number of children: 0   Years of education: Not on file   Highest education level: Associate degree: academic program  Occupational History   Occupation: retired   Tobacco Use   Smoking status: Former    Current packs/day: 0.00    Average packs/day: 1 pack/day for 10.0 years (10.0 ttl pk-yrs)    Types: Cigarettes    Start date: 05/21/1981    Quit date: 05/22/1991    Years since quitting: 32.8   Smokeless tobacco: Never  Vaping Use   Vaping status: Never Used  Substance and Sexual Activity   Alcohol use: Yes    Alcohol/week: 5.0 standard drinks of alcohol    Types: 5 Glasses of wine per week    Comment: 1 glass of wine 5 days per week   Drug use: No   Sexual activity: Yes    Birth control/protection: None  Other Topics Concern    Not on file  Social History Narrative   Stays active around the house, married 2 step-children   Social Drivers of Health   Financial Resource Strain: Low Risk  (03/10/2024)   Overall Financial Resource Strain (CARDIA)    Difficulty of Paying Living Expenses: Not hard at all  Food Insecurity: No Food Insecurity (03/10/2024)   Hunger Vital Sign    Worried About Running Out of Food in the Last Year: Never true    Ran Out of Food in the Last Year: Never true  Transportation Needs: No Transportation Needs (03/10/2024)   PRAPARE - Transportation    Lack of Transportation (  Medical): No    Lack of Transportation (Non-Medical): No  Physical Activity: Insufficiently Active (03/10/2024)   Exercise Vital Sign    Days of Exercise per Week: 2 days    Minutes of Exercise per Session: 30 min  Stress: No Stress Concern Present (03/10/2024)   [employer] of Occupational Health - Occupational Stress Questionnaire    Feeling of Stress: Not at all  Social Connections: Socially Integrated (03/10/2024)   Social Connection and Isolation Panel    Frequency of Communication with Friends and Family: More than three times a week    Frequency of Social Gatherings with Friends and Family: More than three times a week    Attends Religious Services: More than 4 times per year    Active Member of [employer] or Organizations: Yes    Attends Engineer, structural: More than 4 times per year    Marital Status: Married    Tobacco Counseling Counseling given: Not Answered    Clinical Intake:  Pre-visit preparation completed: Yes  Pain : 0-10 Pain Score: 2  Pain Type: Chronic pain Pain Location: Back Pain Descriptors / Indicators: Aching     BMI - recorded: 34.05 Nutritional Status: BMI > 30  Obese Nutritional Risks: None Diabetes: No  No results found for: HGBA1C   How often do you need to have someone help you when you read instructions, pamphlets, or other written materials from  your doctor or pharmacy?: 1 - Never  Interpreter Needed?: No  Information entered by :: Vina Ned, CMA   Activities of Daily Living     03/10/2024    9:22 AM  In your present state of health, do you have any difficulty performing the following activities:  Hearing? 1  Comment wears hearing aids  Vision? 0  Difficulty concentrating or making decisions? 0  Walking or climbing stairs? 0  Dressing or bathing? 0  Doing errands, shopping? 0  Preparing Food and eating ? N  Using the Toilet? N  In the past six months, have you accidently leaked urine? N  Do you have problems with loss of bowel control? N  Managing your Medications? N  Managing your Finances? N  Housekeeping or managing your Housekeeping? N    Patient Care Team: [name], NP as PCP - General (Nurse Practitioner) Mavis Purchase, MD as Consulting Physician (Neurosurgery) Jaye Fallow, MD as Referring Physician (Ophthalmology) Unk Corinn Skiff, MD as Consulting Physician (Gastroenterology) Lucyann Alan HERO, PA-C (Inactive) as Physician Assistant (Cardiology) Tobie Lady POUR, MD as Consulting Physician (Rheumatology) Verlinda Boas, PA-C (Orthopedic Surgery)   I have updated your Care Teams any recent Medical Services you may have received from other providers in the past year.     Assessment:   This is a routine wellness examination for [place].  Hearing/Vision screen Hearing Screening - Comments:: Wears hearing aids Vision Screening - Comments:: Gets routine eye exams, Dr. Jaye Jacobs [place]    Goals Addressed               This Visit's Progress     Weight (lb) < 190 lb (86.2 kg) (pt-stated)   230 lb 9.6 oz (104.6 kg)      Depression Screen      03/10/2024    9:36 AM 03/03/2024    9:50 AM 08/26/2023    9:01 AM 03/05/2023    9:58 AM 02/25/2023   10:19 AM 08/24/2022    8:47 AM 02/22/2022    2:43 PM  PHQ 2/9 Scores  PHQ - 2 Score 0 0 0 0 1 0 0  PHQ- 9 Score 0 0 0 0 1 3 1      Fall Risk      03/10/2024    9:39 AM 03/03/2024    9:50 AM 08/26/2023    9:01 AM 03/05/2023   10:01 AM 02/25/2023   10:19 AM  Fall Risk   Falls in the past year? 0 0 0 0 0  Number falls in past yr: 0 0 0 0 0  Injury with Fall? 0 0 0 0 0  Risk for fall due to : No Fall Risks No Fall Risks No Fall Risks No Fall Risks No Fall Risks  Follow up Falls evaluation completed Falls evaluation completed Falls evaluation completed Falls prevention discussed Falls evaluation completed    MEDICARE RISK AT HOME:   Medicare Risk at Home Any stairs in or around the home?: Yes If so, are there any without handrails?: No Home free of loose throw rugs in walkways, pet beds, electrical cords, etc?: Yes Adequate lighting in your home to reduce risk of falls?: Yes Life alert?: No Use of a cane, walker or w/c?: No Grab bars in the bathroom?: No Shower chair or bench in shower?: Yes Elevated toilet seat or a handicapped toilet?: Yes  TIMED UP AND GO:  Was the test performed?  Yes  Length of time to ambulate 10 feet: 5 sec Gait steady and fast without use of assistive device  Cognitive Function: 6CIT completed        03/10/2024    9:40 AM 03/05/2023   10:02 AM 02/12/2022    9:08 AM 02/10/2021    9:10 AM 02/01/2020    8:19 AM  6CIT Screen  What Year? 0 points 0 points 0 points 0 points 0 points  What month? 0 points 0 points 0 points 0 points 0 points  What time? 0 points 0 points 0 points 0 points 0 points  Count back from 20 0 points 0 points 0 points 0 points 0 points  Months in reverse 0 points 0 points 0 points 0 points 0 points  Repeat phrase 0 points 0 points 0 points 0 points 2 points  Total Score 0 points 0 points 0 points 0 points 2 points    Immunizations Immunization History  Administered Date(s) Administered   Fluad Quad(high Dose 65+) 02/02/2019, 02/22/2022   Fluad Trivalent(High Dose 65+) 01/07/2023   INFLUENZA, HIGH DOSE SEASONAL PF 02/26/2018, 01/21/2020, 03/03/2024    Influenza-Unspecified 02/21/2015, 01/17/2016, 01/26/2020, 01/30/2021   PFIZER Comirnaty(Gray Top)Covid-19 Tri-Sucrose Vaccine 06/14/2019, 07/05/2019, 01/07/2020   PFIZER(Purple Top)SARS-COV-2 Vaccination 06/14/2019, 07/05/2019, 02/03/2021   PNEUMOCOCCAL CONJUGATE-20 02/22/2022   Pfizer(Comirnaty)Fall Seasonal Vaccine 12 years and older 01/17/2023   Pneumococcal Conjugate-13 02/26/2018   Pneumococcal Polysaccharide-23 02/23/2016   RSV,unspecified 04/25/2022   Tdap 12/17/2013   Unspecified SARS-COV-2 Vaccination 09/10/2020, 04/25/2022   Zoster Recombinant(Shingrix) 03/16/2019, 09/19/2019, 10/20/2019   Zoster, Live 02/23/2016    Screening Tests Health Maintenance  Topic Date Due   COVID-19 Vaccine (10 - 2025-26 season) 03/19/2024 (Originally 01/20/2024)   DTaP/Tdap/Td (2 - Td or Tdap) 03/03/2025 (Originally 12/18/2023)   Medicare Annual Wellness (AWV)  03/10/2025   Colonoscopy  07/22/2025   Pneumococcal Vaccine: 50+ Years  Completed   Influenza Vaccine  Completed   Hepatitis C Screening  Completed   Zoster Vaccines- Shingrix  Completed   Meningococcal B Vaccine  Aged Out    Health Maintenance Items Addressed: Vaccines Due: Tdap, See Nurse Notes at  the end of this note  Additional Screening:  Vision Screening: Recommended annual ophthalmology exams for early detection of glaucoma and other disorders of the eye. Is the patient up to date with their annual eye exam?  Yes  Who is the provider or what is the name of the office in which the patient attends annual eye exams? Dr. Jaye @  [facility] [place]  Dental Screening: Recommended annual dental exams for proper oral hygiene  Community Resource Referral / Chronic Care Management: CRR required this visit?  No   CCM required this visit?  No   Plan:    I have personally reviewed and noted the following in the patient's chart:   Medical and social history Use of alcohol, tobacco or illicit drugs  Current medications  and supplements including opioid prescriptions. Patient is not currently taking opioid prescriptions. Functional ability and status Nutritional status Physical activity Advanced directives List of other physicians Hospitalizations, surgeries, and ER visits in previous 12 months Vitals Screenings to include cognitive, depression, and falls Referrals and appointments  In addition, I have reviewed and discussed with patient certain preventive protocols, quality metrics, and best practice recommendations. A written personalized care plan for preventive services as well as general preventive health recommendations were provided to patient.   Vina Ned, CMA   03/10/2024   After Visit Summary: (In Person-Printed) AVS printed and given to the patient  Notes:  Needs Tdap (pharmacy) Declined Covid vaccine

## 2024-03-11 ENCOUNTER — Telehealth: Payer: Self-pay

## 2024-03-11 MED ORDER — TIRZEPATIDE-WEIGHT MANAGEMENT 2.5 MG/0.5ML ~~LOC~~ SOLN: 2.5000 mg | SUBCUTANEOUS | 0 refills | Status: DC

## 2024-03-11 NOTE — Addendum Note (Signed)
 Addended by: [name] on: 03/11/2024 03:10 PM   Modules accepted: Orders

## 2024-03-11 NOTE — Telephone Encounter (Signed)
 Prescription sent to the pharmacy.

## 2024-03-11 NOTE — Telephone Encounter (Signed)
 Copied from CRM [id number]. Topic: Clinical - Prescription Issue >> Mar 11, 2024  1:49 PM Tobias CROME wrote: Reason for CRM: Miracle with [employer] calling about zepbound. The zepbound prescription was rejected on 10/20 due to it being sent in as a pen injector, needing prescription to be resent as vials.

## 2024-03-13 ENCOUNTER — Other Ambulatory Visit (HOSPITAL_COMMUNITY): Payer: Self-pay

## 2024-03-13 DIAGNOSIS — M4316 Spondylolisthesis, lumbar region: Secondary | ICD-10-CM | POA: Diagnosis not present

## 2024-03-16 ENCOUNTER — Telehealth: Payer: Self-pay | Admitting: Pharmacy Technician

## 2024-03-16 NOTE — Telephone Encounter (Signed)
 Pharmacy Patient Advocate Encounter   Received notification from Onbase that prior authorization for Zepbound 2.5MG /0.5ML pen-injectors  is required/requested.   Insurance verification completed.   The patient is insured through [place].   Patient has to have moderate to severe OSA in order for the medication to be covered under Medicare Part D. Patient sleep study shows mild OSA so therefore he does not meet coverage criteria. Chart reflects order sent to [facility] Direct for self pay and patient is aware.

## 2024-03-31 ENCOUNTER — Other Ambulatory Visit: Payer: Self-pay | Admitting: Nurse Practitioner

## 2024-04-02 DIAGNOSIS — M0609 Rheumatoid arthritis without rheumatoid factor, multiple sites: Secondary | ICD-10-CM | POA: Diagnosis not present

## 2024-04-02 MED ORDER — TIRZEPATIDE-WEIGHT MANAGEMENT 5 MG/0.5ML ~~LOC~~ SOLN: 5.0000 mg | SUBCUTANEOUS | 2 refills | Status: DC

## 2024-04-02 NOTE — Telephone Encounter (Signed)
 Refill sent to the pharmacy

## 2024-04-02 NOTE — Telephone Encounter (Signed)
 Called and spoke to patient. He states he is tolerating the medication and would like to go up to the next dose.

## 2024-04-02 NOTE — Telephone Encounter (Signed)
 Requested medication (s) are due for refill today - yes  Requested medication (s) are on the active medication list -yes  Future visit scheduled -yes  Last refill: 03/11/24 2ml  Notes to clinic: off protocol- provider review   Requested Prescriptions  Pending Prescriptions Disp Refills   ZEPBOUND 2.5 MG/0.5ML injection vial [Pharmacy Med Name: ZEPBOUND 2.5 MG/0.5ML SUBCUTANEOUS SOLUTION] 2 mL 0    Sig: INJECT 0.5 ML (2.5 MG) UNDER THE SKIN ONCE WEEKLY (0.5ML= 50 UNITS)     Off-Protocol Failed - 04/02/2024 12:51 PM      Failed - Medication not assigned to a protocol, review manually.      Passed - Valid encounter within last 12 months    Recent Outpatient Visits           1 month ago Rheumatoid arthritis involving both hands with negative rheumatoid factor (HCC)   [place] [facility] [name], NP   7 months ago Stage 3a chronic kidney disease [facility])   [place] [facility] [name], NP                 Requested Prescriptions  Pending Prescriptions Disp Refills   ZEPBOUND 2.5 MG/0.5ML injection vial [Pharmacy Med Name: ZEPBOUND 2.5 MG/0.5ML SUBCUTANEOUS SOLUTION] 2 mL 0    Sig: INJECT 0.5 ML (2.5 MG) UNDER THE SKIN ONCE WEEKLY (0.5ML= 50 UNITS)     Off-Protocol Failed - 04/02/2024 12:51 PM      Failed - Medication not assigned to a protocol, review manually.      Passed - Valid encounter within last 12 months    Recent Outpatient Visits           1 month ago Rheumatoid arthritis involving both hands with negative rheumatoid factor (HCC)   [place] [facility] [name], NP   7 months ago Stage 3a chronic kidney disease [facility])   [place] [facility] [name], NP

## 2024-04-08 DIAGNOSIS — E782 Mixed hyperlipidemia: Secondary | ICD-10-CM | POA: Diagnosis not present

## 2024-04-08 DIAGNOSIS — M0609 Rheumatoid arthritis without rheumatoid factor, multiple sites: Secondary | ICD-10-CM | POA: Diagnosis not present

## 2024-04-08 DIAGNOSIS — I10 Essential (primary) hypertension: Secondary | ICD-10-CM | POA: Diagnosis not present

## 2024-04-08 DIAGNOSIS — N183 Chronic kidney disease, stage 3 unspecified: Secondary | ICD-10-CM | POA: Diagnosis not present

## 2024-04-08 DIAGNOSIS — E66811 Obesity, class 1: Secondary | ICD-10-CM | POA: Diagnosis not present

## 2024-04-23 NOTE — Progress Notes (Signed)
 Medical Nutrition Therapy  Appointment Start time:  1410  Appointment End time:  1515  Primary concerns today: weight reductions to a lower BMI range Referral diagnosis: Stage 3a chronic kidney disease (HCC)  Preferred learning style: hands on, no preference indicated Learning readiness: ready- change in progress  NUTRITION ASSESSMENT   Clinical Medical Hx:   Past Medical History:  Diagnosis Date   Allergy    Arthritis    Rheumatoid arthritis   Chronic kidney disease    Complication of anesthesia    2014-Pt reported that BP dropped durign procedure.   Depression    GERD (gastroesophageal reflux disease)    History of kidney stones    Hyperlipidemia    Hypertension     Medications:  Current Outpatient Medications:    chlorthalidone (HYGROTON) 25 MG tablet, Take 25 mg by mouth daily., Disp: , Rfl:    Cholecalciferol (VITAMIN D3) 25 MCG (1000 UT) CAPS, Take 1,000 Units by mouth daily., Disp: , Rfl:    folic acid (FOLVITE) 1 MG tablet, Take 1 mg by mouth daily., Disp: , Rfl:    loratadine -pseudoephedrine  (CLARITIN -D 12-HOUR) 5-120 MG tablet, Take 1 tablet by mouth every 12 (twelve) hours as needed for allergies., Disp: , Rfl:    methotrexate (RHEUMATREX) 2.5 MG tablet, Take 15 mg by mouth once a week. , Disp: , Rfl:    Multiple Vitamin (MULTIVITAMIN) tablet, Take 1 tablet by mouth daily., Disp: , Rfl:    Omega-3 Fatty Acids (FISH OIL) 1000 MG CAPS, Take 1,000 mg by mouth daily., Disp: , Rfl:    omeprazole (PRILOSEC) 20 MG capsule, Take 20 mg by mouth daily., Disp: , Rfl:    tirzepatide  (ZEPBOUND ) 2.5 MG/0.5ML injection vial, Inject 2.5 mg into the skin once a week., Disp: 2 mL, Rfl: 0   valsartan  (DIOVAN ) 320 MG tablet, Take 320 mg by mouth daily., Disp: , Rfl:    vitamin B-12 (CYANOCOBALAMIN) 1000 MCG tablet, Take 1,000 mcg by mouth daily., Disp: , Rfl:    acetaminophen  (TYLENOL ) 325 MG tablet, Take 650 mg by mouth every 6 (six) hours as needed for moderate pain., Disp: , Rfl:     clindamycin (CLEOCIN T) 1 % external solution, Apply 1 Application topically 2 (two) times daily as needed., Disp: , Rfl:    oxymetazoline  (AFRIN) 0.05 % nasal spray, Place 1 spray into both nostrils 2 (two) times daily as needed for congestion., Disp: , Rfl:    Polyethyl Glycol-Propyl Glycol (SYSTANE OP), Place 1 drop into both eyes daily as needed (dry eyes)., Disp: , Rfl:    sildenafil  (VIAGRA ) 100 MG tablet, Take 0.5-1 tablets (50-100 mg total) by mouth daily as needed for erectile dysfunction. (Patient not taking: Reported on 03/10/2024), Disp: 10 tablet, Rfl: 2   tirzepatide  5 MG/0.5ML injection vial, Inject 5 mg into the skin once a week., Disp: 2 mL, Rfl: 2   tiZANidine  (ZANAFLEX ) 4 MG tablet, TAKE 1 TABLET BY MOUTH THREE TIMES A DAY AS NEEDED, Disp: 270 tablet, Rfl: 1   TURMERIC CURCUMIN PO, Take 1 capsule by mouth daily., Disp: , Rfl:   Labs:  Lab Results  Component Value Date   NA 137 03/03/2024   CL 99 03/03/2024   K 4.2 03/03/2024   CO2 23 03/03/2024   BUN 26 03/03/2024   CREATININE 1.41 (H) 03/03/2024   EGFR 53 (L) 03/03/2024   CALCIUM  9.8 03/03/2024   ALBUMIN 4.5 03/03/2024   GLUCOSE 91 03/03/2024  Last vitamin D  Lab Results  Component  Value Date   VD25OH 51.9 03/03/2024   Lab Results  Component Value Date   CALCIUM  9.8 03/03/2024   Notable Signs/Symptoms:  BP Readings from Last 3 Encounters:  03/10/24 125/82  03/03/24 114/78  12/09/23 116/77   Lifestyle & Dietary Hx Pt presents today alone. Pt desires weight reductions to a lower BMI range. Pt reports he lives with his wife and states shared cooking and Pt does the shopping.   Pt reports he has made the following changes including eating less sweets, smaller portions and omission of sugary sweetened beverages beginning in July 2025.  Pt reports this changes were challenging initially and now feels they are easy to maintain currently.  Pt reports he begin with Zepbound  7 weeks ago. Pt reports his appetitive has  decreased and he feels full fast. Pt reports typical intake of 2-3  meals daily. Pt reports eating out 5 times weekly. Pt reports using a sodium substitute containing potassium for the past 3 years. All Pt's questions were answered during this encounter.   Estimated daily fluid intake: 64 oz Supplements: per chart Sleep: 7 hours nightly  Stress / self-care: 2 out of 10  Current average weekly physical activity: 3-4 days weekly for 20 minutes   24-Hr Dietary Recall First Meal: whole grain english muffin with I can believe it's not butter, 1/2 glass of 2% milk  Snack: none Second Meal: chicken sandwich, few french fries, water  or slices turkey on 2 slices of wheat bread, diet mayo, water   Snack: none Third Meal: ~ 5 ounces baked skinless chicken, rice, stewed apples, water  or green beans, corn, ~ 5 ounces baked skinless chicken or beef, water  Snack: none Beverages: water , milk, coffee with creamer with equal  NUTRITION DIAGNOSIS  NB-1.1 Food and nutrition-related knowledge deficit As related to no prior nutrition related education .  As evidenced by Pt report and dietary recall.  NUTRITION INTERVENTION  Nutrition education (E-1) on the following topics:  Fruits & Vegetables: Aim to fill half your plate with a variety of fruits and vegetables. They are rich in vitamins, minerals, and fiber, and can help reduce the risk of chronic diseases. Choose a colorful assortment of fruits and vegetables to ensure you get a wide range of nutrients. Grains and Starches: Make at least half of your grain choices whole grains, such as brown rice, whole wheat bread, and oats. Whole grains provide fiber, which aids in digestion and healthy cholesterol levels. Aim for whole forms of starchy vegetables such as potatoes, sweet potatoes, beans, peas, and corn, which are fiber rich and provide many vitamins and minerals.  Protein: Incorporate lean sources of protein, such as poultry, fish, beans, nuts, and seeds, into  your meals. Protein is essential for building and repairing tissues, staying full, balancing blood sugar, as well as supporting immune function. Dairy: Include low-fat or fat-free dairy products like milk, yogurt, and cheese in your diet. Dairy foods are excellent sources of calcium  and vitamin D , which are crucial for bone health.  Physical Activity: Aim for 30-60 minutes of physical activity daily as tolerated with MD consent. Regular physical activity promotes overall health-including helping to reduce risk for heart disease and diabetes, promoting mental health, and helping us  sleep better.   Handouts Provided Include  Move Your way: DHHS Plate Planner: NKD Kidney Disease and Protein: NKF  Learning Style & Readiness for Change Teaching method utilized: Visual & Auditory  Demonstrated degree of understanding via: Teach Back  Barriers to learning/adherence to lifestyle change:  none  Goals Established by Pt Aim for balanced meals using the plate planner Increase physical activity as tolerated with MD consent  MONITORING & EVALUATION Dietary intake, weekly physical activity  Next Steps  Patient is to return 06/28/2024

## 2024-04-27 ENCOUNTER — Encounter: Attending: Nurse Practitioner | Admitting: Dietician

## 2024-04-27 ENCOUNTER — Encounter: Payer: Self-pay | Admitting: Dietician

## 2024-04-27 VITALS — Wt 222.2 lb

## 2024-04-27 DIAGNOSIS — N1831 Chronic kidney disease, stage 3a: Secondary | ICD-10-CM | POA: Diagnosis present

## 2024-04-27 DIAGNOSIS — Z713 Dietary counseling and surveillance: Secondary | ICD-10-CM | POA: Insufficient documentation

## 2024-05-19 ENCOUNTER — Ambulatory Visit
Admission: RE | Admit: 2024-05-19 | Discharge: 2024-05-19 | Disposition: A | Discharge status: Home / Self Care | Source: Ambulatory Visit | Attending: Emergency Medicine | Admitting: Emergency Medicine

## 2024-05-19 VITALS — BP 100/68 | HR 99 | Temp 97.7°F | Resp 18

## 2024-05-19 DIAGNOSIS — K5900 Constipation, unspecified: Secondary | ICD-10-CM | POA: Insufficient documentation

## 2024-05-19 DIAGNOSIS — R300 Dysuria: Secondary | ICD-10-CM | POA: Insufficient documentation

## 2024-05-19 LAB — POCT URINE DIPSTICK
Blood, UA: NEGATIVE
Glucose, UA: NEGATIVE mg/dL
Nitrite, UA: NEGATIVE
POC PROTEIN,UA: 30 — AB
Spec Grav, UA: 1.025
Urobilinogen, UA: 1 U/dL
pH, UA: 5.5

## 2024-05-19 MED ORDER — LACTULOSE 10 GM/15ML PO SOLN: 10.0000 g | Freq: Every day | ORAL | 0 refills | Status: AC | PRN

## 2024-05-19 MED ORDER — NITROFURANTOIN MONOHYD MACRO 100 MG PO CAPS: 100.0000 mg | ORAL_CAPSULE | Freq: Two times a day (BID) | ORAL | 0 refills | Status: AC

## 2024-05-19 NOTE — ED Triage Notes (Signed)
 Patient reports burning  sensation when he urinate x 1 week. Patient also complains of constipation x 5 days. Patient reports taken stool softer had small bowel move this morning. Patient states I feel terrible. Denies abdominal pain at this time.

## 2024-05-19 NOTE — ED Provider Notes (Signed)
 " [facility]    CSN: [id number] Arrival date & time: 05/19/24  1027      History   Chief Complaint Chief Complaint  Patient presents with   Dysuria   Constipation    HPI Paul Rodriguez is a 73 y.o. male.   Patient presents for evaluation of dysuria, urinary frequency and incomplete bladder emptying present for 7 days.  Has not attempted treatment.  Denies hematuria, abdominal or flank pain, penile discharge or penile or testicle swelling.  Patient also experiencing constipation for 5 days, and endorses typically has a bowel movement 1-2 times a day.  Takes daily MiraLAX and has added senna over the past 24 hours, able to have a small bowel movement this morning.  Denies abdominal pain.  For 7 days  Endorsing fatigue and malaise\with poor appetite.      Past Medical History:  Diagnosis Date   Allergy    Arthritis    Rheumatoid arthritis   Chronic kidney disease    Complication of anesthesia    2014-Pt reported that BP dropped durign procedure.   Depression    GERD (gastroesophageal reflux disease)    History of kidney stones    Hyperlipidemia    Hypertension     Patient Active Problem List   Diagnosis Date Noted   Obesity (BMI 30.0-34.9) 03/03/2024   Lumbar stenosis with neurogenic claudication 03/22/2021   Dermatochalasis of both upper eyelids 06/17/2020   Ptosis of both eyebrows 06/17/2020   Myalgia due to statin 03/04/2020   CKD (chronic kidney disease) stage 3, GFR 30-59 ml/min (HCC) 08/03/2019   Encounter for screening colonoscopy    ED (erectile dysfunction) 01/15/2018   Rheumatoid arthritis (HCC) 12/12/2017   Advanced care planning/counseling discussion 12/12/2017   Vitamin D  deficiency 02/13/2017   BPH (benign prostatic hyperplasia) 08/13/2016   Hyperlipidemia 02/23/2016   Essential hypertension 08/03/2015    Past Surgical History:  Procedure Laterality Date   COLONOSCOPY WITH PROPOFOL  N/A 07/23/2018   Procedure: COLONOSCOPY WITH  PROPOFOL ;  Surgeon: Unk Corinn Skiff, MD;  Location: [facility];  Service: Endoscopy;  Laterality: N/A;  Requests early   EYE SURGERY  Lasik   eyelid surgery Bilateral 06/2020   HIP SURGERY Right 2014   arthroscopic   HIP SURGERY Left 03/2013   arthroscopic   KNEE SURGERY Left 03/2013   NOSE SURGERY     POLYPECTOMY  07/23/2018   Procedure: POLYPECTOMY;  Surgeon: Unk Corinn Skiff, MD;  Location: [facility];  Service: Endoscopy;;   SPINE SURGERY         Home Medications    Prior to Admission medications  Medication Sig Start Date End Date Taking? Authorizing Provider  acetaminophen  (TYLENOL ) 325 MG tablet Take 650 mg by mouth every 6 (six) hours as needed for moderate pain.    [provider]  chlorthalidone (HYGROTON) 25 MG tablet Take 25 mg by mouth daily.    [provider]  Cholecalciferol (VITAMIN D3) 25 MCG (1000 UT) CAPS Take 1,000 Units by mouth daily.    [provider]  clindamycin (CLEOCIN T) 1 % external solution Apply 1 Application topically 2 (two) times daily as needed.    [provider]  folic acid (FOLVITE) 1 MG tablet Take 1 mg by mouth daily.    [provider]  loratadine -pseudoephedrine  (CLARITIN -D 12-HOUR) 5-120 MG tablet Take 1 tablet by mouth every 12 (twelve) hours as needed for allergies.    [provider]  methotrexate (RHEUMATREX) 2.5  MG tablet Take 15 mg by mouth once a week.  09/23/17   [provider]  Multiple Vitamin (MULTIVITAMIN) tablet Take 1 tablet by mouth daily.    [provider]  Omega-3 Fatty Acids (FISH OIL) 1000 MG CAPS Take 1,000 mg by mouth daily.    [provider]  omeprazole (PRILOSEC) 20 MG capsule Take 20 mg by mouth daily. 06/21/19   [provider]  oxymetazoline  (AFRIN) 0.05 % nasal spray Place 1 spray into both nostrils 2 (two) times daily as needed for congestion.    [provider]  Polyethyl Glycol-Propyl  Glycol (SYSTANE OP) Place 1 drop into both eyes daily as needed (dry eyes).    [provider]  sildenafil  (VIAGRA ) 100 MG tablet Take 0.5-1 tablets (50-100 mg total) by mouth daily as needed for erectile dysfunction. Patient not taking: Reported on 03/10/2024 03/03/24   [name], NP  tirzepatide  (ZEPBOUND ) 2.5 MG/0.5ML injection vial Inject 2.5 mg into the skin once a week. 03/11/24   [name], NP  tirzepatide  5 MG/0.5ML injection vial Inject 5 mg into the skin once a week. 04/02/24   [name], NP  tiZANidine  (ZANAFLEX ) 4 MG tablet TAKE 1 TABLET BY MOUTH THREE TIMES A DAY AS NEEDED 12/05/23   [name], NP  TURMERIC CURCUMIN PO Take 1 capsule by mouth daily.    [provider]  valsartan  (DIOVAN ) 320 MG tablet Take 320 mg by mouth daily.    [provider]  vitamin B-12 (CYANOCOBALAMIN) 1000 MCG tablet Take 1,000 mcg by mouth daily.    [provider]    Family History Family History  Problem Relation Age of Onset   Hypertension Mother    Arthritis Mother    Hypertension Father    Heart disease Father    Stroke Father    Heart disease Maternal Grandmother    Diabetes Maternal Grandfather    Cancer Paternal Grandmother    Cancer Paternal Grandfather     Social History Social History[1]   Allergies   Ultram [tramadol]   Review of Systems Review of Systems  Constitutional:  Positive for fatigue. Negative for activity change, appetite change, chills, diaphoresis, fever and unexpected weight change.  Gastrointestinal:  Positive for constipation. Negative for abdominal distention, abdominal pain, anal bleeding, blood in stool, diarrhea, nausea, rectal pain and vomiting.  Genitourinary:  Positive for dysuria and frequency. Negative for decreased urine volume, difficulty urinating, enuresis, flank pain, genital sores, hematuria, penile discharge, penile pain, penile swelling, scrotal swelling, testicular pain and  urgency.     Physical Exam Triage Vital Signs ED Triage Vitals  Encounter Vitals Group     BP 05/19/24 1055 100/68     Girls Systolic BP Percentile --      Girls Diastolic BP Percentile --      Boys Systolic BP Percentile --      Boys Diastolic BP Percentile --      Pulse Rate 05/19/24 1055 99     Resp 05/19/24 1055 18     Temp 05/19/24 1055 97.7 F (36.5 C)     Temp Source 05/19/24 1055 Oral     SpO2 05/19/24 1055 96 %     Weight --      Height --      Head Circumference --      Peak Flow --      Pain Score 05/19/24 1059 0     Pain Loc --      Pain Education --  Exclude from Growth Chart --    No data found.  Updated Vital Signs BP 100/68 (BP Location: Right Arm)   Pulse 99   Temp 97.7 F (36.5 C) (Oral)   Resp 18   SpO2 96%   Visual Acuity Right Eye Distance:   Left Eye Distance:   Bilateral Distance:    Right Eye Near:   Left Eye Near:    Bilateral Near:     Physical Exam Constitutional:      Appearance: Normal appearance.  Eyes:     Extraocular Movements: Extraocular movements intact.  Pulmonary:     Effort: Pulmonary effort is normal.  Abdominal:     Tenderness: There is no abdominal tenderness. There is no right CVA tenderness, left CVA tenderness or guarding.  Neurological:     Mental Status: He is alert and oriented to person, place, and time.      UC Treatments / Results  Labs (all labs ordered are listed, but only abnormal results are displayed) Labs Reviewed  POCT URINE DIPSTICK - Abnormal; Notable for the following components:      Result Value   Color, UA orange (*)    Bilirubin, UA small (*)    Ketones, POC UA trace (5) (*)    POC PROTEIN,UA =30 (*)    Leukocytes, UA Trace (*)    All other components within normal limits  URINE CULTURE    EKG   Radiology No results found.  Procedures Procedures (including critical care time)  Medications Ordered in UC Medications - No data to display  Initial Impression /  Assessment and Plan / UC Course  I have reviewed the triage vital signs and the nursing notes.  Pertinent labs & imaging results that were available during my care of the patient were reviewed by me and considered in my medical decision making (see chart for details).  Dysuria, constipation  Urinalysis showing leukocytes, negative for nitrates, sent for culture empirically placed on Macrobid to see if symptomatic recommended increase fluid intake and good hygiene with follow-up if symptoms continue to persist or worsen  Had small bowel movement today, no abdominal pain noted on exam, low suspicion for obstruction, stable for outpatient management, prescribed lactulose, advised increase fluid intake and discontinuing medication after complete bowel movement, may follow-up with primary if symptoms continue to persist Final Clinical Impressions(s) / UC Diagnoses   Final diagnoses:  Dysuria   Discharge Instructions   None    ED Prescriptions   None    PDMP not reviewed this encounter.     [1]  Social History Tobacco Use   Smoking status: Former    Current packs/day: 0.00    Average packs/day: 1 pack/day for 10.0 years (10.0 ttl pk-yrs)    Types: Cigarettes    Start date: 05/21/1981    Quit date: 05/22/1991    Years since quitting: 33.0   Smokeless tobacco: Never  Vaping Use   Vaping status: Never Used  Substance Use Topics   Alcohol use: Yes    Alcohol/week: 5.0 standard drinks of alcohol    Types: 5 Glasses of wine per week    Comment: 1 glass of wine 5 days per week   Drug use: No     Teresa Shelba SAUNDERS, NP 05/19/24 1144  "

## 2024-05-19 NOTE — Discharge Instructions (Addendum)
 Your urinalysis shows Saharah Sherrow blood cells but does not show bacteria, your urine will be sent to the lab to determine exactly which bacteria is present, if any changes need to be made to your medications you will be notified  On exam your abdomen is soft and you do not have any abdominal pain which is reassuring that constipation has not caused a blockage which is a serious condition  Begin use of Macrobid twice daily for 7 days as you have symptoms  Begin lactulose once a day to help with constipation, once you have a full and complete bowel movement please stop use of medicine, medicine can cause urgency to have bowel movement ensure that you have access to a bathroom  Ensure that you are drinking lots of water  and electrolyte replacements while using lactulose  You may use over-the-counter Azo to help minimize your pain with urination until antibiotic removes bacteria, this medication will turn your urine orange, may be purchased over-the-counter  Increase your fluid intake through use of water   As always practice good hygiene, wiping front to back and avoidance of scented vaginal products to prevent further irritation  If symptoms continue to persist after use of medication or recur please follow-up with urgent care or your primary doctor as needed

## 2024-05-22 ENCOUNTER — Ambulatory Visit (HOSPITAL_COMMUNITY): Payer: Self-pay

## 2024-05-22 LAB — URINE CULTURE: Culture: 30000 — AB

## 2024-05-22 MED ORDER — CEPHALEXIN 500 MG PO CAPS: 500.0000 mg | ORAL_CAPSULE | Freq: Two times a day (BID) | ORAL | 0 refills | Status: AC

## 2024-05-29 ENCOUNTER — Other Ambulatory Visit: Payer: Self-pay | Admitting: Nurse Practitioner

## 2024-05-29 NOTE — Telephone Encounter (Signed)
 Requested medication (s) are due for refill today: Yes  Requested medication (s) are on the active medication list: Yes  Last refill:  11/30/23  Future visit scheduled: Yes  Notes to clinic:  Not delegated.    Requested Prescriptions  Pending Prescriptions Disp Refills   tiZANidine  (ZANAFLEX ) 4 MG tablet [Pharmacy Med Name: TIZANIDINE  HCL 4 MG TABLET] 270 tablet 1    Sig: TAKE 1 TABLET BY MOUTH THREE TIMES A DAY AS NEEDED     Not Delegated - Cardiovascular:  Alpha-2 Agonists - tizanidine  Failed - 05/29/2024  2:13 PM      Failed - This refill cannot be delegated      Passed - Valid encounter within last 6 months    Recent Outpatient Visits           2 months ago Rheumatoid arthritis involving both hands with negative rheumatoid factor (HCC)   [place] [facility] [name], NP   9 months ago Stage 3a chronic kidney disease [facility])   [place] [facility] [name], NP

## 2024-06-03 ENCOUNTER — Ambulatory Visit (INDEPENDENT_AMBULATORY_CARE_PROVIDER_SITE_OTHER): Admitting: Nurse Practitioner

## 2024-06-03 ENCOUNTER — Other Ambulatory Visit: Payer: Self-pay | Admitting: Nurse Practitioner

## 2024-06-03 ENCOUNTER — Encounter: Payer: Self-pay | Admitting: Nurse Practitioner

## 2024-06-03 VITALS — BP 112/77 | HR 82 | Temp 97.9°F | Ht 69.02 in | Wt 214.4 lb

## 2024-06-03 DIAGNOSIS — N183 Chronic kidney disease, stage 3 unspecified: Secondary | ICD-10-CM

## 2024-06-03 DIAGNOSIS — E66811 Obesity, class 1: Secondary | ICD-10-CM

## 2024-06-03 DIAGNOSIS — N1831 Chronic kidney disease, stage 3a: Secondary | ICD-10-CM

## 2024-06-03 MED ORDER — TIRZEPATIDE-WEIGHT MANAGEMENT 5 MG/0.5ML ~~LOC~~ SOLN: 5.0000 mg | SUBCUTANEOUS | 2 refills | Status: AC

## 2024-06-03 NOTE — Progress Notes (Signed)
 "  BP 112/77 (BP Location: Left Arm, Patient Position: Sitting, Cuff Size: Normal)   Pulse 82   Temp 97.9 F (36.6 C) (Oral)   Ht 5' 9.02 (1.753 m)   Wt 214 lb 6.4 oz (97.3 kg)   SpO2 96%   BMI 31.65 kg/m    Subjective:    Patient ID: Paul Rodriguez, male    DOB: 1951-03-09, 74 y.o.   MRN: [id number]  HPI: Paul Rodriguez is a 74 y.o. male  Chief Complaint  Patient presents with   Weight Management Screening    3 month F/u   WEIGHT GAIN Duration: Years Patient has been on Zepbound  for about 3 months.  He had gotten down to 214lbs and felt kind of stuck.  Based on his scale he has lost about 20lbs since starting the medication.  He weighed as much as 242lbs in the summer and started losing weight on his own.     CHRONIC KIDNEY DISEASE CKD status: controlled Medications renally dose: yes Previous renal evaluation: no Pneumovax:  Up to Date Influenza Vaccine:  Up to Date  Relevant past medical, surgical, family and social history reviewed and updated as indicated. Interim medical history since our last visit reviewed. Allergies and medications reviewed and updated.  Review of Systems  Constitutional:  Negative for unexpected weight change.    Per HPI unless specifically indicated above     Objective:    BP 112/77 (BP Location: Left Arm, Patient Position: Sitting, Cuff Size: Normal)   Pulse 82   Temp 97.9 F (36.6 C) (Oral)   Ht 5' 9.02 (1.753 m)   Wt 214 lb 6.4 oz (97.3 kg)   SpO2 96%   BMI 31.65 kg/m   Wt Readings from Last 3 Encounters:  06/03/24 214 lb 6.4 oz (97.3 kg)  04/27/24 222 lb 3.2 oz (100.8 kg)  03/10/24 230 lb 9.6 oz (104.6 kg)    Physical Exam Vitals and nursing note reviewed.  Constitutional:      General: He is not in acute distress.    Appearance: Normal appearance. He is obese. He is not ill-appearing, toxic-appearing or diaphoretic.  HENT:     Head: Normocephalic.     Right Ear: External ear normal.     Left Ear: External ear  normal.     Nose: Nose normal. No congestion or rhinorrhea.     Mouth/Throat:     Mouth: Mucous membranes are moist.  Eyes:     General:        Right eye: No discharge.        Left eye: No discharge.     Extraocular Movements: Extraocular movements intact.     Conjunctiva/sclera: Conjunctivae normal.     Pupils: Pupils are equal, round, and reactive to light.  Cardiovascular:     Rate and Rhythm: Normal rate and regular rhythm.     Heart sounds: No murmur heard. Pulmonary:     Effort: Pulmonary effort is normal. No respiratory distress.     Breath sounds: Normal breath sounds. No wheezing, rhonchi or rales.  Abdominal:     General: Abdomen is flat. Bowel sounds are normal.  Musculoskeletal:     Cervical back: Normal range of motion and neck supple.  Skin:    General: Skin is warm and dry.     Capillary Refill: Capillary refill takes less than 2 seconds.  Neurological:     General: No focal deficit present.     Mental Status: He is  alert and oriented to person, place, and time.  Psychiatric:        Mood and Affect: Mood normal.        Behavior: Behavior normal.        Thought Content: Thought content normal.        Judgment: Judgment normal.     Results for orders placed or performed during the hospital encounter of 05/19/24  Urine Culture   Collection Time: 05/19/24 11:13 AM   Specimen: Urine, Clean Catch  Result Value Ref Range   Specimen Description URINE, CLEAN CATCH    Special Requests      NONE Performed at [facility], [address] [place], [place] [address]    Culture 30,000 COLONIES/mL KLEBSIELLA PNEUMONIAE (A)    Report Status 05/22/2024 FINAL    Organism ID, Bacteria KLEBSIELLA PNEUMONIAE (A)       Susceptibility   Klebsiella pneumoniae - MIC*    AMPICILLIN >=32 RESISTANT Resistant     CEFAZOLIN  (URINE) Value in next row Sensitive      2 SENSITIVEThis is a modified FDA-approved test that has been validated and its performance characteristics  determined by the reporting laboratory.  This laboratory is certified under the Clinical Laboratory Improvement Amendments CLIA as qualified to perform high complexity clinical laboratory testing.    CEFEPIME Value in next row Sensitive      2 SENSITIVEThis is a modified FDA-approved test that has been validated and its performance characteristics determined by the reporting laboratory.  This laboratory is certified under the Clinical Laboratory Improvement Amendments CLIA as qualified to perform high complexity clinical laboratory testing.    ERTAPENEM Value in next row Sensitive      2 SENSITIVEThis is a modified FDA-approved test that has been validated and its performance characteristics determined by the reporting laboratory.  This laboratory is certified under the Clinical Laboratory Improvement Amendments CLIA as qualified to perform high complexity clinical laboratory testing.    CEFTRIAXONE Value in next row Sensitive      2 SENSITIVEThis is a modified FDA-approved test that has been validated and its performance characteristics determined by the reporting laboratory.  This laboratory is certified under the Clinical Laboratory Improvement Amendments CLIA as qualified to perform high complexity clinical laboratory testing.    CIPROFLOXACIN Value in next row Sensitive      2 SENSITIVEThis is a modified FDA-approved test that has been validated and its performance characteristics determined by the reporting laboratory.  This laboratory is certified under the Clinical Laboratory Improvement Amendments CLIA as qualified to perform high complexity clinical laboratory testing.    GENTAMICIN Value in next row Sensitive      2 SENSITIVEThis is a modified FDA-approved test that has been validated and its performance characteristics determined by the reporting laboratory.  This laboratory is certified under the Clinical Laboratory Improvement Amendments CLIA as qualified to perform high complexity clinical  laboratory testing.    NITROFURANTOIN  Value in next row Intermediate      2 SENSITIVEThis is a modified FDA-approved test that has been validated and its performance characteristics determined by the reporting laboratory.  This laboratory is certified under the Clinical Laboratory Improvement Amendments CLIA as qualified to perform high complexity clinical laboratory testing.    TRIMETH/SULFA Value in next row Sensitive      2 SENSITIVEThis is a modified FDA-approved test that has been validated and its performance characteristics determined by the reporting laboratory.  This laboratory is certified under the Clinical Laboratory Improvement  Amendments CLIA as qualified to perform high complexity clinical laboratory testing.    AMPICILLIN/SULBACTAM Value in next row Sensitive      2 SENSITIVEThis is a modified FDA-approved test that has been validated and its performance characteristics determined by the reporting laboratory.  This laboratory is certified under the Clinical Laboratory Improvement Amendments CLIA as qualified to perform high complexity clinical laboratory testing.    PIP/TAZO Value in next row Sensitive      <=4 SENSITIVEThis is a modified FDA-approved test that has been validated and its performance characteristics determined by the reporting laboratory.  This laboratory is certified under the Clinical Laboratory Improvement Amendments CLIA as qualified to perform high complexity clinical laboratory testing.    MEROPENEM Value in next row Sensitive      <=4 SENSITIVEThis is a modified FDA-approved test that has been validated and its performance characteristics determined by the reporting laboratory.  This laboratory is certified under the Clinical Laboratory Improvement Amendments CLIA as qualified to perform high complexity clinical laboratory testing.    * 30,000 COLONIES/mL KLEBSIELLA PNEUMONIAE  POCT URINE DIPSTICK   Collection Time: 05/19/24 11:13 AM  Result Value Ref Range    Color, UA orange (A) yellow   Clarity, UA clear clear   Glucose, UA negative negative mg/dL   Bilirubin, UA small (A) negative   Ketones, POC UA trace (5) (A) negative mg/dL   Spec Grav, UA 8.974 8.989 - 1.025   Blood, UA negative negative   pH, UA 5.5 5.0 - 8.0   POC PROTEIN,UA =30 (A) negative, trace   Urobilinogen, UA 1.0 0.2 or 1.0 E.U./dL   Nitrite, UA Negative Negative   Leukocytes, UA Trace (A) Negative      Assessment & Plan:   Problem List Items Addressed This Visit       Genitourinary   CKD (chronic kidney disease) stage 3, GFR 30-59 ml/min (HCC) - Primary   Chronic.  Controlled.  Continue with current medication regimen.  Labs ordered today.  Return to clinic in 6 months for reevaluation.  Call sooner if concerns arise.        Relevant Orders   Comp Met (CMET)     Other   Obesity (BMI 30.0-34.9)   Chronic.  Improved with Zepbound .  Currently on Zepbound  5mg  and doing well.  Will continue with current dose of medication.  Continue with diet and exercise.  Follow up in 3 months.  Call sooner if concerns arise.         Follow up plan: Return in about 3 months (around 09/01/2024) for HTN, HLD, DM2 FU.      "

## 2024-06-03 NOTE — Assessment & Plan Note (Addendum)
 Chronic.  Improved with Zepbound .  Currently on Zepbound  5mg  and doing well.  Will continue with current dose of medication.  Continue with diet and exercise.  Follow up in 3 months.  Call sooner if concerns arise.

## 2024-06-03 NOTE — Assessment & Plan Note (Signed)
 Chronic.  Controlled.  Continue with current medication regimen.  Labs ordered today.  Return to clinic in 6 months for reevaluation.  Call sooner if concerns arise.  ? ?

## 2024-06-04 ENCOUNTER — Other Ambulatory Visit: Payer: Self-pay | Admitting: Nurse Practitioner

## 2024-06-04 ENCOUNTER — Other Ambulatory Visit

## 2024-06-04 ENCOUNTER — Ambulatory Visit: Payer: Self-pay | Admitting: Nurse Practitioner

## 2024-06-04 DIAGNOSIS — I10 Essential (primary) hypertension: Secondary | ICD-10-CM

## 2024-06-04 LAB — COMPREHENSIVE METABOLIC PANEL WITH GFR
ALT: 27 IU/L (ref 0–44)
AST: 27 IU/L (ref 0–40)
Albumin: 4.6 g/dL (ref 3.8–4.8)
Alkaline Phosphatase: 81 IU/L (ref 47–123)
BUN/Creatinine Ratio: 18 (ref 10–24)
BUN: 33 mg/dL — ABNORMAL HIGH (ref 8–27)
Bilirubin Total: 0.7 mg/dL (ref 0.0–1.2)
CO2: 22 mmol/L (ref 20–29)
Calcium: 9.5 mg/dL (ref 8.6–10.2)
Chloride: 98 mmol/L (ref 96–106)
Creatinine, Ser: 1.8 mg/dL — ABNORMAL HIGH (ref 0.76–1.27)
Globulin, Total: 2.4 g/dL (ref 1.5–4.5)
Glucose: 93 mg/dL (ref 70–99)
Potassium: 3.9 mmol/L (ref 3.5–5.2)
Sodium: 137 mmol/L (ref 134–144)
Total Protein: 7 g/dL (ref 6.0–8.5)
eGFR: 39 mL/min/1.73 — ABNORMAL LOW

## 2024-06-04 NOTE — Telephone Encounter (Signed)
 Requested medication (s) are due for refill today - no  Requested medication (s) are on the active medication list -yes  Future visit scheduled -yes  Last refill: 06/03/24  Notes to clinic: Pharmacy comment: Alternative Requested:NOT COVERED. PRIOR AUTH.   Requested Prescriptions  Pending Prescriptions Disp Refills   ZEPBOUND  2.5 MG/0.5ML Pen [Pharmacy Med Name: ZEPBOUND  2.5 MG/0.5 ML PEN]  2    Sig: INJECT 5 MG SUBCUTANEOUSLY WEEKLY     Off-Protocol Failed - 06/04/2024 11:19 AM      Failed - Medication not assigned to a protocol, review manually.      Passed - Valid encounter within last 12 months    Recent Outpatient Visits           Yesterday Stage 3a chronic kidney disease [facility])   [place] [facility] [name], NP   3 months ago Rheumatoid arthritis involving both hands with negative rheumatoid factor (HCC)   [place] [facility] [name], NP   9 months ago Stage 3a chronic kidney disease [facility])   [place] [facility] [name], NP                 Requested Prescriptions  Pending Prescriptions Disp Refills   ZEPBOUND  2.5 MG/0.5ML Pen [Pharmacy Med Name: ZEPBOUND  2.5 MG/0.5 ML PEN]  2    Sig: INJECT 5 MG SUBCUTANEOUSLY WEEKLY     Off-Protocol Failed - 06/04/2024 11:19 AM      Failed - Medication not assigned to a protocol, review manually.      Passed - Valid encounter within last 12 months    Recent Outpatient Visits           Yesterday Stage 3a chronic kidney disease [facility])   [place] [facility] [name], NP   3 months ago Rheumatoid arthritis involving both hands with negative rheumatoid factor [facility])   [place] [facility] [name], NP   9 months ago Stage 3a chronic kidney disease [facility])   [place] [facility] [name], NP

## 2024-06-04 NOTE — Telephone Encounter (Signed)
 Can PA be started for this medication?

## 2024-06-05 ENCOUNTER — Other Ambulatory Visit (HOSPITAL_COMMUNITY): Payer: Self-pay

## 2024-06-05 ENCOUNTER — Ambulatory Visit: Payer: Self-pay | Admitting: Nurse Practitioner

## 2024-06-05 ENCOUNTER — Other Ambulatory Visit: Payer: Self-pay | Admitting: Nurse Practitioner

## 2024-06-05 LAB — COMPREHENSIVE METABOLIC PANEL WITH GFR
ALT: 24 IU/L (ref 0–44)
AST: 24 IU/L (ref 0–40)
Albumin: 4.5 g/dL (ref 3.8–4.8)
Alkaline Phosphatase: 72 IU/L (ref 47–123)
BUN/Creatinine Ratio: 18 (ref 10–24)
BUN: 27 mg/dL (ref 8–27)
Bilirubin Total: 0.9 mg/dL (ref 0.0–1.2)
CO2: 22 mmol/L (ref 20–29)
Calcium: 9.3 mg/dL (ref 8.6–10.2)
Chloride: 98 mmol/L (ref 96–106)
Creatinine, Ser: 1.52 mg/dL — ABNORMAL HIGH (ref 0.76–1.27)
Globulin, Total: 2.3 g/dL (ref 1.5–4.5)
Glucose: 85 mg/dL (ref 70–99)
Potassium: 3.8 mmol/L (ref 3.5–5.2)
Sodium: 136 mmol/L (ref 134–144)
Total Protein: 6.8 g/dL (ref 6.0–8.5)
eGFR: 48 mL/min/1.73 — ABNORMAL LOW

## 2024-06-05 NOTE — Telephone Encounter (Signed)
 Requested medication (s) are due for refill today: na   Requested medication (s) are on the active medication list: yes   Last refill:  06/03/24 #2 ml 2 refills  Future visit scheduled: yes 09/03/24  Notes to clinic:  Pharmacy comment: Alternative Requested:PA REQUIRED.  Please advise medication not assigned to a protocol     Requested Prescriptions  Pending Prescriptions Disp Refills   ZEPBOUND  2.5 MG/0.5ML Pen [Pharmacy Med Name: ZEPBOUND  2.5 MG/0.5 ML PEN]  2    Sig: INJECT 5 MG SUBCUTANEOUSLY WEEKLY     Off-Protocol Failed - 06/05/2024  3:50 PM      Failed - Medication not assigned to a protocol, review manually.      Passed - Valid encounter within last 12 months    Recent Outpatient Visits           2 days ago Stage 3a chronic kidney disease [facility])   [place] [facility] [name], NP   3 months ago Rheumatoid arthritis involving both hands with negative rheumatoid factor (HCC)   [place] [facility] [name], NP   9 months ago Stage 3a chronic kidney disease [facility])   [place] [facility] [name], NP

## 2024-06-05 NOTE — Telephone Encounter (Signed)
 Requested Prescriptions  Refused Prescriptions Disp Refills   ZEPBOUND  2.5 MG/0.5ML Pen [Pharmacy Med Name: ZEPBOUND  2.5 MG/0.5 ML PEN]  2    Sig: INJECT 5 MG SUBCUTANEOUSLY WEEKLY     Off-Protocol Failed - 06/05/2024  5:21 PM      Failed - Medication not assigned to a protocol, review manually.      Passed - Valid encounter within last 12 months    Recent Outpatient Visits           2 days ago Stage 3a chronic kidney disease [facility])   [place] [facility] [name], NP   3 months ago Rheumatoid arthritis involving both hands with negative rheumatoid factor (HCC)   [place] [facility] [name], NP   9 months ago Stage 3a chronic kidney disease [place])   [place] [facility] [name], NP

## 2024-06-08 ENCOUNTER — Ambulatory Visit: Admitting: Nurse Practitioner

## 2024-06-08 NOTE — Telephone Encounter (Signed)
 PA team can you guys submit this prior auth please?

## 2024-06-08 NOTE — Telephone Encounter (Signed)
 Requested medication (s) are due for refill today:   Pharmacy comment: Alternative Requested:PA REQUIRED.    Requested medication (s) are on the active medication list: yes  Last refill:  06/03/24  Future visit scheduled: yes  Notes to clinic:    Pharmacy comment: Alternative Requested:PA REQUIRED.       Requested Prescriptions  Pending Prescriptions Disp Refills   ZEPBOUND  2.5 MG/0.5ML Pen [Pharmacy Med Name: ZEPBOUND  2.5 MG/0.5 ML PEN]  2    Sig: INJECT 5 MG SUBCUTANEOUSLY WEEKLY     Off-Protocol Failed - 06/08/2024  8:30 AM      Failed - Medication not assigned to a protocol, review manually.      Passed - Valid encounter within last 12 months    Recent Outpatient Visits           5 days ago Stage 3a chronic kidney disease [facility])   [place] [name], NP   3 months ago Rheumatoid arthritis involving both hands with negative rheumatoid factor [facility])   [place] [facility] [name], NP   9 months ago Stage 3a chronic kidney disease [facility])   [place] [facility] [name], NP

## 2024-06-09 ENCOUNTER — Other Ambulatory Visit: Payer: Self-pay | Admitting: Nurse Practitioner

## 2024-06-09 ENCOUNTER — Other Ambulatory Visit (HOSPITAL_COMMUNITY): Payer: Self-pay

## 2024-06-09 ENCOUNTER — Telehealth: Payer: Self-pay

## 2024-06-09 NOTE — Telephone Encounter (Signed)
 Pharmacy Patient Advocate Encounter  Received notification from [employer] that Prior Authorization for Zepbound  5mg /0.35ml pen has been DENIED.  See denial reason below. No denial letter attached in CMM. Will attach denial letter to Media tab once received.   PA #/Case ID/Reference #: [id number]

## 2024-06-09 NOTE — Telephone Encounter (Signed)
 Pharmacy Patient Advocate Encounter   Received notification from RX Request Messages that prior authorization for Zepbound  5mg /0.51ml is required/requested.   Insurance verification completed.   The patient is insured through [employer].   Per test claim: PA required; PA submitted to above mentioned insurance via Latent Key/confirmation #/EOC [id number] Status is pending

## 2024-06-10 ENCOUNTER — Other Ambulatory Visit: Payer: Self-pay | Admitting: Nurse Practitioner

## 2024-06-10 NOTE — Telephone Encounter (Signed)
 Requested by interface surescripts. Medication dose discontinued 06/03/24 change in therapy.  Requested Prescriptions  Refused Prescriptions Disp Refills   ZEPBOUND  2.5 MG/0.5ML Pen [Pharmacy Med Name: ZEPBOUND  2.5 MG/0.5 ML PEN]  2    Sig: INJECT 5 MG SUBCUTANEOUSLY WEEKLY     Off-Protocol Failed - 06/10/2024 10:01 AM      Failed - Medication not assigned to a protocol, review manually.      Passed - Valid encounter within last 12 months    Recent Outpatient Visits           1 week ago Stage 3a chronic kidney disease [facility])   [place] [facility] [name], NP   3 months ago Rheumatoid arthritis involving both hands with negative rheumatoid factor [facility])   [place] [facility] [name], NP   9 months ago Stage 3a chronic kidney disease [facility])   [place] [facility] [name], NP

## 2024-06-10 NOTE — Telephone Encounter (Signed)
 Requested medication (s) are due for refill today: Pharmacy comment: Alternative Requested:DIAGNOSIS CODE REQUIRED.   Requested medication (s) are on the active medication list: yes  Last refill:  06/03/24  Future visit scheduled: yes  Notes to clinic:  Pharmacy comment: Alternative Requested:DIAGNOSIS CODE REQUIRED.      Requested Prescriptions  Pending Prescriptions Disp Refills   ZEPBOUND  2.5 MG/0.5ML Pen [Pharmacy Med Name: ZEPBOUND  2.5 MG/0.5 ML PEN]  2    Sig: INJECT 5 MG SUBCUTANEOUSLY WEEKLY     Off-Protocol Failed - 06/10/2024  3:43 PM      Failed - Medication not assigned to a protocol, review manually.      Passed - Valid encounter within last 12 months    Recent Outpatient Visits           1 week ago Stage 3a chronic kidney disease [facility])   [place] [facility] [name], NP   3 months ago Rheumatoid arthritis involving both hands with negative rheumatoid factor [facility])   [place] [facility] [name], NP   9 months ago Stage 3a chronic kidney disease [facility])   [place] [facility] [name], NP

## 2024-06-16 ENCOUNTER — Ambulatory Visit

## 2024-06-19 ENCOUNTER — Ambulatory Visit: Admission: RE | Admit: 2024-06-19 | Discharge: 2024-06-19 | Disposition: A | Discharge status: Home / Self Care

## 2024-06-19 VITALS — BP 109/74 | HR 104 | Temp 98.0°F | Resp 15 | Ht 69.0 in | Wt 214.5 lb

## 2024-06-19 DIAGNOSIS — R051 Acute cough: Secondary | ICD-10-CM | POA: Diagnosis not present

## 2024-06-19 DIAGNOSIS — J22 Unspecified acute lower respiratory infection: Secondary | ICD-10-CM | POA: Diagnosis not present

## 2024-06-19 MED ORDER — BENZONATATE 100 MG PO CAPS: 100.0000 mg | ORAL_CAPSULE | Freq: Three times a day (TID) | ORAL | 0 refills | Status: AC

## 2024-06-19 MED ORDER — DOXYCYCLINE HYCLATE 100 MG PO CAPS: 100.0000 mg | ORAL_CAPSULE | Freq: Two times a day (BID) | ORAL | 0 refills | Status: AC

## 2024-06-19 NOTE — ED Provider Notes (Signed)
 " [facility]    CSN: [id number] Arrival date & time: 06/19/24  1420      History   Chief Complaint Chief Complaint  Patient presents with   Cough    Appointment    HPI Paul Rodriguez is a 74 y.o. male.   74 year old male patient, Paul Rodriguez, presents to urgent care for evaluation of cough, congestion, headache for 2 weeks, patient states his wife was treated for bronchitis recently.  Patient denies any fevers. Pt is drinking well, voiding well.  PMH: Gerd, HTN, CKD,allergies  The history is provided by the patient. No language interpreter was used.  Cough Associated symptoms: headaches   Associated symptoms: no chest pain, no fever and no shortness of breath     Past Medical History:  Diagnosis Date   Allergy    Arthritis    Rheumatoid arthritis   Chronic kidney disease    Complication of anesthesia    2014-Pt reported that BP dropped durign procedure.   Depression    GERD (gastroesophageal reflux disease)    History of kidney stones    Hyperlipidemia    Hypertension     Patient Active Problem List   Diagnosis Date Noted   Acute cough 06/19/2024   Obesity (BMI 30.0-34.9) 03/03/2024   Acute respiratory infection 02/07/2023   Lumbar stenosis with neurogenic claudication 03/22/2021   Dermatochalasis of both upper eyelids 06/17/2020   Ptosis of both eyebrows 06/17/2020   Myalgia due to statin 03/04/2020   CKD (chronic kidney disease) stage 3, GFR 30-59 ml/min (HCC) 08/03/2019   Encounter for screening colonoscopy    ED (erectile dysfunction) 01/15/2018   Rheumatoid arthritis (HCC) 12/12/2017   Advanced care planning/counseling discussion 12/12/2017   Vitamin D  deficiency 02/13/2017   BPH (benign prostatic hyperplasia) 08/13/2016   Hyperlipidemia 02/23/2016   Essential hypertension 08/03/2015    Past Surgical History:  Procedure Laterality Date   COLONOSCOPY WITH PROPOFOL  N/A 07/23/2018   Procedure: COLONOSCOPY WITH PROPOFOL ;  Surgeon:  Unk Corinn Skiff, MD;  Location: [facility];  Service: Endoscopy;  Laterality: N/A;  Requests early   EYE SURGERY  Lasik   eyelid surgery Bilateral 06/2020   HIP SURGERY Right 2014   arthroscopic   HIP SURGERY Left 03/2013   arthroscopic   KNEE SURGERY Left 03/2013   NOSE SURGERY     POLYPECTOMY  07/23/2018   Procedure: POLYPECTOMY;  Surgeon: Unk Corinn Skiff, MD;  Location: [facility];  Service: Endoscopy;;   SPINE SURGERY         Home Medications    Prior to Admission medications  Medication Sig Start Date End Date Taking? Authorizing Provider  benzonatate  (TESSALON ) 100 MG capsule Take 1 capsule (100 mg total) by mouth every 8 (eight) hours. 06/19/24  Yes Daily Crate, Rilla, NP  doxycycline  (VIBRAMYCIN ) 100 MG capsule Take 1 capsule (100 mg total) by mouth 2 (two) times daily for 7 days. 06/19/24 06/26/24 Yes Tawfiq Favila, NP  rosuvastatin  (CRESTOR ) 5 MG tablet Take 5 mg by mouth. 06/16/24 06/16/25 Yes [provider]  acetaminophen  (TYLENOL ) 325 MG tablet Take 650 mg by mouth every 6 (six) hours as needed for moderate pain.    [provider]  chlorthalidone (HYGROTON) 25 MG tablet Take 25 mg by mouth daily.    [provider]  Cholecalciferol (VITAMIN D3) 25 MCG (1000 UT) CAPS Take 1,000 Units by mouth daily.    [provider]  clindamycin (CLEOCIN T) 1 % external solution Apply 1  Application topically 2 (two) times daily as needed.    [provider]  folic acid (FOLVITE) 1 MG tablet Take 1 mg by mouth daily.    [provider]  lactulose  (CHRONULAC ) 10 GM/15ML solution Take 15 mLs (10 g total) by mouth daily as needed for mild constipation. 05/19/24   White, Shelba SAUNDERS, NP  loratadine -pseudoephedrine  (CLARITIN -D 12-HOUR) 5-120 MG tablet Take 1 tablet by mouth every 12 (twelve) hours as needed for allergies.    [provider]  methotrexate (RHEUMATREX) 2.5 MG tablet Take 15 mg by mouth once a  week.  09/23/17   [provider]  Multiple Vitamin (MULTIVITAMIN) tablet Take 1 tablet by mouth daily.    [provider]  Omega-3 Fatty Acids (FISH OIL) 1000 MG CAPS Take 1,000 mg by mouth daily.    [provider]  omeprazole (PRILOSEC) 20 MG capsule Take 20 mg by mouth daily. 06/21/19   [provider]  oxymetazoline  (AFRIN) 0.05 % nasal spray Place 1 spray into both nostrils 2 (two) times daily as needed for congestion.    [provider]  Polyethyl Glycol-Propyl Glycol (SYSTANE OP) Place 1 drop into both eyes daily as needed (dry eyes).    [provider]  sildenafil  (VIAGRA ) 100 MG tablet Take 0.5-1 tablets (50-100 mg total) by mouth daily as needed for erectile dysfunction. Patient not taking: Reported on 06/03/2024 03/03/24   [name], NP  tirzepatide  5 MG/0.5ML injection vial Inject 5 mg into the skin once a week. 06/03/24   [name], NP  tiZANidine  (ZANAFLEX ) 4 MG tablet TAKE 1 TABLET BY MOUTH THREE TIMES A DAY AS NEEDED 06/01/24   [name], NP  TURMERIC CURCUMIN PO Take 1 capsule by mouth daily.    [provider]  valsartan  (DIOVAN ) 320 MG tablet Take 320 mg by mouth daily.    [provider]  vitamin B-12 (CYANOCOBALAMIN) 1000 MCG tablet Take 1,000 mcg by mouth daily.    [provider]    Family History Family History  Problem Relation Age of Onset   Hypertension Mother    Arthritis Mother    Hypertension Father    Heart disease Father    Stroke Father    Heart disease Maternal Grandmother    Diabetes Maternal Grandfather    Cancer Paternal Grandmother    Cancer Paternal Grandfather     Social History Social History[1]   Allergies   Ultram [tramadol]   Review of Systems Review of Systems  Constitutional:  Negative for fever.  HENT:  Positive for congestion.   Respiratory:  Positive for cough. Negative for shortness of breath.   Cardiovascular:  Negative for  chest pain and palpitations.  Gastrointestinal:  Negative for nausea and vomiting.  Musculoskeletal:  Negative for back pain.  Neurological:  Positive for headaches.  All other systems reviewed and are negative.    Physical Exam Triage Vital Signs ED Triage Vitals  Encounter Vitals Group     BP 06/19/24 1436 109/74     Girls Systolic BP Percentile --      Girls Diastolic BP Percentile --      Boys Systolic BP Percentile --      Boys Diastolic BP Percentile --      Pulse Rate 06/19/24 1436 (!) 104     Resp 06/19/24 1436 15     Temp 06/19/24 1436 98 F (36.7 C)     Temp Source 06/19/24 1436 Oral     SpO2 06/19/24  1436 98 %     Weight 06/19/24 1435 214 lb 8.1 oz (97.3 kg)     Height 06/19/24 1435 5' 9 (1.753 m)     Head Circumference --      Peak Flow --      Pain Score 06/19/24 1435 0     Pain Loc --      Pain Education --      Exclude from Growth Chart --    No data found.  Updated Vital Signs BP 109/74 (BP Location: Right Arm)   Pulse (!) 104   Temp 98 F (36.7 C) (Oral)   Resp 15   Ht 5' 9 (1.753 m)   Wt 214 lb 8.1 oz (97.3 kg)   SpO2 98%   BMI 31.68 kg/m   Visual Acuity Right Eye Distance:   Left Eye Distance:   Bilateral Distance:    Right Eye Near:   Left Eye Near:    Bilateral Near:     Physical Exam Vitals and nursing note reviewed.  Constitutional:      General: He is not in acute distress.    Appearance: He is well-developed and well-groomed. He is not ill-appearing or toxic-appearing.  HENT:     Head: Normocephalic.     Right Ear: Tympanic membrane is retracted.     Left Ear: Tympanic membrane is retracted.     Nose: Mucosal edema and congestion present.     Right Sinus: Maxillary sinus tenderness present.     Left Sinus: Maxillary sinus tenderness present.     Mouth/Throat:     Lips: Pink.     Mouth: Mucous membranes are moist.     Pharynx: Uvula midline. Postnasal drip present.     Comments: +yellow PND Eyes:     General: Lids are  normal.     Conjunctiva/sclera: Conjunctivae normal.     Pupils: Pupils are equal, round, and reactive to light.  Cardiovascular:     Rate and Rhythm: Normal rate and regular rhythm.     Heart sounds: Normal heart sounds.  Pulmonary:     Effort: Pulmonary effort is normal. No respiratory distress.     Breath sounds: Normal breath sounds and air entry. No decreased breath sounds or wheezing.  Abdominal:     General: There is no distension.     Palpations: Abdomen is soft.  Musculoskeletal:        General: Normal range of motion.     Cervical back: Normal range of motion.  Skin:    General: Skin is warm and dry.     Findings: No rash.  Neurological:     General: No focal deficit present.     Mental Status: He is alert and oriented to person, place, and time.     GCS: GCS eye subscore is 4. GCS verbal subscore is 5. GCS motor subscore is 6.     Cranial Nerves: No cranial nerve deficit.     Sensory: No sensory deficit.  Psychiatric:        Speech: Speech normal.        Behavior: Behavior normal. Behavior is cooperative.      UC Treatments / Results  Labs (all labs ordered are listed, but only abnormal results are displayed) Labs Reviewed - No data to display  EKG   Radiology No results found.  Procedures Procedures (including critical care time)  Medications Ordered in UC Medications - No data to display  Initial Impression / Assessment and Plan /  UC Course  I have reviewed the triage vital signs and the nursing notes.  Pertinent labs & imaging results that were available during my care of the patient were reviewed by me and considered in my medical decision making (see chart for details).     Discussed exam findings and plan of care with patient :we are treating you for a respiratory infection Take meds as directed(doxycycline  and tessalon ). Follow up with PCP. Push fluids,avoid caffeine If you develop chest pain, palpitations, shortness of breath, worsening  symptoms, go to the emergency room for further evaluation.  Patient verbalized understanding to this provider.  Ddx: Acute respiratory infection, viral illness, allergies Final Clinical Impressions(s) / UC Diagnoses   Final diagnoses:  Acute respiratory infection  Acute cough     Discharge Instructions      We are treating you for a respiratory infection Take meds as directed(doxycycline  and tessalon ). Follow up with PCP. Push fluids,avoid caffeine If you develop chest pain, palpitations, shortness of breath, worsening symptoms, go to the emergency room for further evaluation     ED Prescriptions     Medication Sig Dispense Auth. Provider   doxycycline  (VIBRAMYCIN ) 100 MG capsule Take 1 capsule (100 mg total) by mouth 2 (two) times daily for 7 days. 14 capsule Renu Asby, NP   benzonatate  (TESSALON ) 100 MG capsule Take 1 capsule (100 mg total) by mouth every 8 (eight) hours. 21 capsule Gabbi Whetstone, NP      PDMP not reviewed this encounter.     [1]  Social History Tobacco Use   Smoking status: Former    Current packs/day: 0.00    Average packs/day: 1 pack/day for 10.0 years (10.0 ttl pk-yrs)    Types: Cigarettes    Start date: 05/21/1981    Quit date: 05/22/1991    Years since quitting: 33.1   Smokeless tobacco: Never  Vaping Use   Vaping status: Never Used  Substance Use Topics   Alcohol use: Yes    Alcohol/week: 5.0 standard drinks of alcohol    Types: 5 Glasses of wine per week    Comment: 1 glass of wine 5 days per week   Drug use: No     Lashae Wollenberg, Rilla, NP 06/19/24 1503  "

## 2024-06-19 NOTE — ED Triage Notes (Signed)
 Patient c/o cough and chest congestion that started a week ago.  Patient states that his wife was treated for Bronchitis.  Patient denies fevers.

## 2024-06-19 NOTE — Discharge Instructions (Addendum)
 We are treating you for a respiratory infection Take meds as directed(doxycycline  and tessalon ). Follow up with PCP. Push fluids,avoid caffeine If you develop chest pain, palpitations, shortness of breath, worsening symptoms, go to the emergency room for further evaluation

## 2024-06-23 ENCOUNTER — Encounter: Admitting: Dietician

## 2024-06-24 ENCOUNTER — Other Ambulatory Visit: Payer: Self-pay | Admitting: Emergency Medicine

## 2024-06-24 DIAGNOSIS — J849 Interstitial pulmonary disease, unspecified: Secondary | ICD-10-CM

## 2024-06-24 DIAGNOSIS — R0602 Shortness of breath: Secondary | ICD-10-CM

## 2024-06-26 ENCOUNTER — Ambulatory Visit: Admission: RE | Admit: 2024-06-26

## 2024-06-26 DIAGNOSIS — R0602 Shortness of breath: Secondary | ICD-10-CM

## 2024-06-26 DIAGNOSIS — J849 Interstitial pulmonary disease, unspecified: Secondary | ICD-10-CM

## 2024-09-03 ENCOUNTER — Ambulatory Visit: Admitting: Nurse Practitioner

## 2025-03-16 ENCOUNTER — Ambulatory Visit
